# Patient Record
Sex: Female | Born: 1945
Health system: Southern US, Community
[De-identification: ages and names within clinical notes are randomized; demographics above are authoritative.]

## PROBLEM LIST (undated history)

## (undated) DIAGNOSIS — H269 Unspecified cataract: Secondary | ICD-10-CM

## (undated) DIAGNOSIS — G0490 Encephalitis and encephalomyelitis, unspecified: Secondary | ICD-10-CM

## (undated) DIAGNOSIS — M2540 Effusion, unspecified joint: Secondary | ICD-10-CM

## (undated) DIAGNOSIS — H9190 Unspecified hearing loss, unspecified ear: Secondary | ICD-10-CM

## (undated) DIAGNOSIS — M1990 Unspecified osteoarthritis, unspecified site: Secondary | ICD-10-CM

## (undated) DIAGNOSIS — M549 Dorsalgia, unspecified: Secondary | ICD-10-CM

## (undated) DIAGNOSIS — L719 Rosacea, unspecified: Secondary | ICD-10-CM

## (undated) DIAGNOSIS — R351 Nocturia: Secondary | ICD-10-CM

## (undated) DIAGNOSIS — R51 Headache: Secondary | ICD-10-CM

## (undated) DIAGNOSIS — I10 Essential (primary) hypertension: Secondary | ICD-10-CM

## (undated) DIAGNOSIS — F419 Anxiety disorder, unspecified: Secondary | ICD-10-CM

## (undated) DIAGNOSIS — Z9289 Personal history of other medical treatment: Secondary | ICD-10-CM

## (undated) DIAGNOSIS — F32A Depression, unspecified: Secondary | ICD-10-CM

## (undated) DIAGNOSIS — R413 Other amnesia: Secondary | ICD-10-CM

## (undated) DIAGNOSIS — M2550 Pain in unspecified joint: Secondary | ICD-10-CM

## (undated) DIAGNOSIS — G4700 Insomnia, unspecified: Secondary | ICD-10-CM

## (undated) DIAGNOSIS — F329 Major depressive disorder, single episode, unspecified: Secondary | ICD-10-CM

## (undated) HISTORY — DX: Unspecified hearing loss, unspecified ear: H91.90

## (undated) HISTORY — PX: MASS EXCISION: SHX2000

## (undated) HISTORY — DX: Encephalitis and encephalomyelitis, unspecified: G04.90

## (undated) HISTORY — DX: Anxiety disorder, unspecified: F41.9

## (undated) HISTORY — PX: RETINAL DETACHMENT SURGERY: SHX105

## (undated) HISTORY — PX: COLONOSCOPY: SHX174

## (undated) HISTORY — DX: Other amnesia: R41.3

## (undated) HISTORY — DX: Essential (primary) hypertension: I10

## (undated) HISTORY — DX: Rosacea, unspecified: L71.9

---

## 1971-07-16 DIAGNOSIS — G0490 Encephalitis and encephalomyelitis, unspecified: Secondary | ICD-10-CM

## 1971-07-16 HISTORY — DX: Encephalitis and encephalomyelitis, unspecified: G04.90

## 1999-08-16 ENCOUNTER — Encounter: Admission: RE | Admit: 1999-08-16 | Discharge: 1999-08-16 | Payer: Self-pay | Admitting: Family Medicine

## 1999-08-16 ENCOUNTER — Encounter: Payer: Self-pay | Admitting: Family Medicine

## 2000-08-18 ENCOUNTER — Encounter: Admission: RE | Admit: 2000-08-18 | Discharge: 2000-08-18 | Payer: Self-pay | Admitting: Family Medicine

## 2000-08-18 ENCOUNTER — Encounter: Payer: Self-pay | Admitting: Family Medicine

## 2001-09-08 ENCOUNTER — Encounter: Admission: RE | Admit: 2001-09-08 | Discharge: 2001-09-08 | Payer: Self-pay | Admitting: Family Medicine

## 2001-09-08 ENCOUNTER — Encounter: Payer: Self-pay | Admitting: Family Medicine

## 2002-04-02 ENCOUNTER — Ambulatory Visit (HOSPITAL_COMMUNITY): Admission: RE | Admit: 2002-04-02 | Discharge: 2002-04-02 | Payer: Self-pay | Admitting: Family Medicine

## 2002-04-02 ENCOUNTER — Encounter: Payer: Self-pay | Admitting: Family Medicine

## 2002-09-15 ENCOUNTER — Encounter: Payer: Self-pay | Admitting: Family Medicine

## 2002-09-15 ENCOUNTER — Encounter: Admission: RE | Admit: 2002-09-15 | Discharge: 2002-09-15 | Payer: Self-pay | Admitting: Family Medicine

## 2002-12-08 ENCOUNTER — Other Ambulatory Visit: Admission: RE | Admit: 2002-12-08 | Discharge: 2002-12-08 | Payer: Self-pay | Admitting: Family Medicine

## 2003-01-27 ENCOUNTER — Encounter: Payer: Self-pay | Admitting: Family Medicine

## 2003-01-27 ENCOUNTER — Ambulatory Visit (HOSPITAL_COMMUNITY): Admission: RE | Admit: 2003-01-27 | Discharge: 2003-01-27 | Payer: Self-pay | Admitting: Family Medicine

## 2003-02-16 ENCOUNTER — Encounter: Payer: Self-pay | Admitting: Family Medicine

## 2003-02-16 ENCOUNTER — Ambulatory Visit (HOSPITAL_COMMUNITY): Admission: RE | Admit: 2003-02-16 | Discharge: 2003-02-16 | Payer: Self-pay | Admitting: Family Medicine

## 2003-06-25 ENCOUNTER — Ambulatory Visit (HOSPITAL_COMMUNITY): Admission: RE | Admit: 2003-06-25 | Discharge: 2003-06-25 | Payer: Self-pay | Admitting: Family Medicine

## 2003-07-16 HISTORY — PX: CYST EXCISION: SHX5701

## 2003-08-17 ENCOUNTER — Ambulatory Visit (HOSPITAL_COMMUNITY): Admission: RE | Admit: 2003-08-17 | Discharge: 2003-08-17 | Payer: Self-pay | Admitting: Family Medicine

## 2003-09-22 ENCOUNTER — Encounter: Admission: RE | Admit: 2003-09-22 | Discharge: 2003-09-22 | Payer: Self-pay | Admitting: Family Medicine

## 2003-10-14 ENCOUNTER — Ambulatory Visit (HOSPITAL_COMMUNITY): Admission: RE | Admit: 2003-10-14 | Discharge: 2003-10-14 | Payer: Self-pay | Admitting: Family Medicine

## 2003-10-25 ENCOUNTER — Ambulatory Visit (HOSPITAL_COMMUNITY): Admission: RE | Admit: 2003-10-25 | Discharge: 2003-10-25 | Payer: Self-pay | Admitting: Family Medicine

## 2003-12-13 ENCOUNTER — Ambulatory Visit (HOSPITAL_COMMUNITY): Admission: RE | Admit: 2003-12-13 | Discharge: 2003-12-14 | Payer: Self-pay | Admitting: Neurosurgery

## 2003-12-13 ENCOUNTER — Encounter (INDEPENDENT_AMBULATORY_CARE_PROVIDER_SITE_OTHER): Payer: Self-pay | Admitting: Specialist

## 2004-06-27 ENCOUNTER — Other Ambulatory Visit: Admission: RE | Admit: 2004-06-27 | Discharge: 2004-06-27 | Payer: Self-pay | Admitting: Family Medicine

## 2005-07-15 HISTORY — PX: BACK SURGERY: SHX140

## 2005-12-24 ENCOUNTER — Encounter: Admission: RE | Admit: 2005-12-24 | Discharge: 2005-12-24 | Payer: Self-pay | Admitting: Neurosurgery

## 2006-03-07 ENCOUNTER — Ambulatory Visit: Admission: RE | Admit: 2006-03-07 | Discharge: 2006-03-07 | Payer: Self-pay | Admitting: Neurosurgery

## 2006-03-11 ENCOUNTER — Ambulatory Visit: Payer: Self-pay | Admitting: Cardiology

## 2006-03-11 ENCOUNTER — Ambulatory Visit: Payer: Self-pay

## 2006-03-11 ENCOUNTER — Encounter: Payer: Self-pay | Admitting: Cardiology

## 2006-03-20 ENCOUNTER — Inpatient Hospital Stay (HOSPITAL_COMMUNITY): Admission: RE | Admit: 2006-03-20 | Discharge: 2006-03-23 | Payer: Self-pay | Admitting: Neurosurgery

## 2006-04-22 ENCOUNTER — Encounter: Admission: RE | Admit: 2006-04-22 | Discharge: 2006-04-22 | Payer: Self-pay | Admitting: Neurosurgery

## 2006-08-05 ENCOUNTER — Other Ambulatory Visit: Admission: RE | Admit: 2006-08-05 | Discharge: 2006-08-05 | Payer: Self-pay | Admitting: Family Medicine

## 2006-08-12 ENCOUNTER — Encounter: Admission: RE | Admit: 2006-08-12 | Discharge: 2006-08-12 | Payer: Self-pay | Admitting: Neurosurgery

## 2007-03-26 ENCOUNTER — Encounter: Admission: RE | Admit: 2007-03-26 | Discharge: 2007-03-26 | Payer: Self-pay | Admitting: Neurosurgery

## 2007-07-16 HISTORY — PX: JOINT REPLACEMENT: SHX530

## 2007-08-26 ENCOUNTER — Other Ambulatory Visit: Admission: RE | Admit: 2007-08-26 | Discharge: 2007-08-26 | Payer: Self-pay | Admitting: Family Medicine

## 2008-02-18 ENCOUNTER — Encounter: Admission: RE | Admit: 2008-02-18 | Discharge: 2008-02-18 | Payer: Self-pay | Admitting: Orthopaedic Surgery

## 2008-03-01 ENCOUNTER — Encounter (INDEPENDENT_AMBULATORY_CARE_PROVIDER_SITE_OTHER): Payer: Self-pay | Admitting: Orthopedic Surgery

## 2008-03-01 ENCOUNTER — Ambulatory Visit: Payer: Self-pay | Admitting: Vascular Surgery

## 2008-03-01 ENCOUNTER — Ambulatory Visit (HOSPITAL_COMMUNITY): Admission: RE | Admit: 2008-03-01 | Discharge: 2008-03-01 | Payer: Self-pay | Admitting: Orthopedic Surgery

## 2008-05-10 ENCOUNTER — Inpatient Hospital Stay (HOSPITAL_COMMUNITY): Admission: RE | Admit: 2008-05-10 | Discharge: 2008-05-13 | Payer: Self-pay | Admitting: Orthopaedic Surgery

## 2008-08-17 ENCOUNTER — Encounter: Admission: RE | Admit: 2008-08-17 | Discharge: 2008-08-17 | Payer: Self-pay | Admitting: Family Medicine

## 2008-10-31 ENCOUNTER — Other Ambulatory Visit: Admission: RE | Admit: 2008-10-31 | Discharge: 2008-10-31 | Payer: Self-pay | Admitting: Family Medicine

## 2009-12-08 ENCOUNTER — Ambulatory Visit (HOSPITAL_BASED_OUTPATIENT_CLINIC_OR_DEPARTMENT_OTHER): Admission: RE | Admit: 2009-12-08 | Discharge: 2009-12-08 | Payer: Self-pay | Admitting: Orthopedic Surgery

## 2009-12-25 ENCOUNTER — Encounter: Admission: RE | Admit: 2009-12-25 | Discharge: 2009-12-25 | Payer: Self-pay | Admitting: Family Medicine

## 2009-12-27 ENCOUNTER — Encounter: Admission: RE | Admit: 2009-12-27 | Discharge: 2009-12-27 | Payer: Self-pay | Admitting: Family Medicine

## 2010-11-27 NOTE — Op Note (Signed)
[name], [name]               ACCOUNT NO.:  [id number]   MEDICAL RECORD NO.:  [id number]          PATIENT TYPE:  INP   LOCATION:  5040                         FACILITY:  [facility]   PHYSICIAN:  Lubertha Basque. Dalldorf, M.D.DATE OF BIRTH:  02-27-1946   DATE OF PROCEDURE:  05/10/2008  DATE OF DISCHARGE:                               OPERATIVE REPORT   PREOPERATIVE DIAGNOSIS:  Left knee degenerative arthritis.   POSTOPERATIVE DIAGNOSIS:  Left knee degenerative arthritis.   PROCEDURE:  Left total knee replacement.   ANESTHESIA:  General and block.   ATTENDING SURGEON:  Lubertha Basque. Jerl Santos, MD   ASSISTANT:  Lindwood Qua, PA   INDICATIONS FOR PROCEDURE:  The patient is a 65 year old woman with a  long history of bilateral arthritis of knees.  She has failed at least 3  different oral anti-inflammatories.  She has also had cortisone  injection and a course of viscosupplementation agents.  She persists  with pain at both knees, left side slightly worse.  This limits her  ability to walk and rest.  By x-ray, she has end-stage degeneration of  medial and patellofemoral compartments.  She is offered knee  replacement.  Informed operative consent was obtained after discussion  of possible complications of reaction to anesthesia, infection, DVT, PE,  and death.   SUMMARY, FINDINGS, AND PROCEDURE:  Under general and block, a left knee  replacement was performed.  She had advanced degenerative change medial  and patellofemoral and good bone quality.  We addressed her problem with  cemented DePuy LCS system using the medium femur, 2.5 tibia, 10-mm deep  dish spacer, 32-mm all polyethylene patella.  We did include antibiotic  in the cement.  Bryna Colander assisted throughout and was invaluable to  the completion of the case in that he helped position and retract while  I performed the procedure.  He also closed simultaneously to help  minimize the OR time.   DESCRIPTION OF PROCEDURE:  The  patient was brought to the operating  suite where general anesthetic was applied without difficulty.  She was  given a block in the preanesthesia area.  She was positioned supine and  prepped and draped in normal sterile fashion.  After administration of  IV Kefzol, the left leg was elevated, exsanguinated, tourniquet inflated  above thigh.  All appropriate antiinfective measures were used including  the preoperative IV antibiotic, Betadine impregnated drape, enclosed  hooded exhaust systems for each member of surgical team.  A longitudinal  incision was made with dissection down to the extensor mechanism.  A  medial parapatellar incision was made and then structured.  The kneecap  was flipped and the knee flexed.  Findings were as noted above.  Excess  meniscal tissues and the ACL and PCL were excised.  We placed an  extramedullary guide on the tibia to make a cut with slight posterior  tilt.  We then placed an intramedullary guide in the femur to make  anterior and posterior cuts, creating a flexion gap of 10 mm.  A second  intramedullary guide was placed in the femur to make a  distal cut,  creating 10-mm extension gap, thereby balancing the knee.  We did set  her up slightly loose in terms of extension as she had a preoperative  flexion contracture.  The femur sized to a medium and the tibia to 2.5  and appropriate guides was placed and utilized.  The patella was cut  down to thickness by 8 mm to 15 and sized to 32.  A trial reduction was  done with these components.  Knee easily came to hyperextension and  flexed well.  The patella tracked well.  Trial components were removed,  followed by pulsatile lavage irrigation of all 3 cut bony surfaces.  Cement was mixed including Zinacef and this was pressurized onto the  bones followed by placement of the aforementioned components.  Excess  cement was trimmed and pressure was held on the component until cement  had hardened.  The knee was  again irrigated followed by placement of  drain exiting superolaterally.  The extensor mechanism was  reapproximated with #1 Vicryl in interrupted fashion followed by  subcutaneous reapproximation with 0 and 2-0 undyed Vicryl.  Skin was  closed with staples.  Adaptic was applied followed by dry gauze and  loose Ace wrap.  Estimated blood loss and intraoperative fluids can be  obtained from anesthesia records as can accurate tourniquet time.   DISPOSITION:  The patient was extubated in the operating room and taken  to recovery room in stable addition.  She will be admitted to the  orthopedic surgery service for appropriate postop care to include  perioperative antibiotics, Coumadin plus Lovenox for DVT prophylaxis.      Lubertha Basque Jerl Santos, M.D.  Electronically Signed     PGD/MEDQ  D:  05/10/2008  T:  05/11/2008  Job:  [id number]

## 2010-11-30 NOTE — Discharge Summary (Signed)
[name], [name]               ACCOUNT NO.:  [id number]   MEDICAL RECORD NO.:  [id number]          PATIENT TYPE:  INP   LOCATION:  3014                         FACILITY:  [facility]   PHYSICIAN:  Donalee Citrin, M.D.        DATE OF BIRTH:  [birth year]   DATE OF ADMISSION:  03/20/2006  DATE OF DISCHARGE:  03/23/2006                                 DISCHARGE SUMMARY   ADMITTING DIAGNOSIS:  Grade 1 spondylolisthesis with degenerative joint  disease, L4-L5.   PROCEDURE:  During this hospitalization was a posterior lumbar interbody  fusion L4-L5.   DISCHARGE DIAGNOSIS:  Degenerative disk disease spondylolisthesis L4-L5 with  bilateral L5 radiculopathy.   HOSPITAL COURSE:  The patient is a 65 year old female who was admitted to  the hospital and taken to the operating room and underwent the  aforementioned procedure.  Post-op the patient did very well, recovered on  the floor.  By day one, the patient was mobilized with physical therapy.  Leg pain had been completely resolved.  Foley was able to be taken out.  Hemovac drain was able to be taken out on day two.  The patient continued to  do well with therapy at the time of discharge and was felt to be stable for  discharge home.  She was ambulating, and voiding spontaneously, pain well  controlled on oral pain medicine.  She was afebrile and set up with followup  in 2 weeks.           ______________________________  Donalee Citrin, M.D.     GC/MEDQ  D:  05/09/2006  T:  05/09/2006  Job:  [id number]

## 2010-11-30 NOTE — Discharge Summary (Signed)
[name], [name]               ACCOUNT NO.:  [id number]   MEDICAL RECORD NO.:  [id number]          PATIENT TYPE:  INP   LOCATION:  3014                         FACILITY:  [facility]   PHYSICIAN:  Donalee Citrin, M.D.        DATE OF BIRTH:  15-Jun-1946   DATE OF ADMISSION:  03/20/2006  DATE OF DISCHARGE:  03/23/2006                                 DISCHARGE SUMMARY   ADMITTING DIAGNOSIS:  1. Degenerative disc disease.  2. Degenerative spondylolisthesis L4-5.   PROCEDURE:  Posterior lumbar fusion L4-5.   HOSPITAL COURSE:  The patient was admitted ___________. Went to the  Operating Room and underwent the aforementioned procedure. Postoperatively,  the patient did very well in the Recovery Room and the floor. On the floor,  the patient had complete resolution of her preoperative leg pain and very  minimal back stiffness. The patient was progressed and mobilized with  physical therapy. Did very well over the next day or so. Her drain came out.  Her Foley came out. She was ambulating and voiding spontaneously. She will  be able to be discharged on hospital day 3. At the time of discharge the  patient will be sent home on p.o. pain and muscle relaxant medication and  set up for follow up in two weeks.           ______________________________  Donalee Citrin, M.D.     GC/MEDQ  D:  04/18/2006  T:  04/18/2006  Job:  [id number]

## 2010-11-30 NOTE — Assessment & Plan Note (Signed)
[facility]                              CARDIOLOGY OFFICE NOTE   NAME:[name], [name]                      MRN:          [id number]  DATE:03/11/2006                            DOB:          [birth year]    REFERRING PHYSICIAN:  Donalee Citrin, M.D.   PRIMARY CARE PHYSICIAN:  Holley Bouche, M.D.   REASON FOR EVALUATION:  Preoperative assessment with abnormal  electrocardiogram.   HISTORY OF PRESENT ILLNESS:  [name] is a pleasant 65 year old woman  with no clearly documented history of hypertension, type 2 diabetes  mellitus, coronary artery disease, or cardiac dysrhythmia.  She has a  history of back and right hip/leg pain that has been a problem since  December of 2006.  She is being considered for a lumbar fusion that has been  scheduled for tomorrow and we have been asked to evaluate her  preoperatively.  This surgery will be under general anesthesia and she did  have basic preoperative assessment including electrocardiogram done on  August 24. This study demonstrated sinus rhythm at 67 beats per minute with  an incomplete right bundle branch block pattern, possible left atrial  enlargement, and question of a septal infarct that was described as being  new compared to the prior tracing.  I do not have the old tracing for  comparison but today's study shows a probable ectopic atrial rhythm with  less evidence of incomplete right bundle branch block pattern and no clearly  documented septal infarct.  There are otherwise nonspecific ST-T wave  changes noted.  Symptomatically [name] denies having any limitations  from the perspective of chest pain with exertion or dyspnea on exertion.  Her main limitation at this point is due to right leg pain although despite  this, she states that she ambulates up and down stairs several times a day  at her place of business.  Prior to December, she was also walking regularly  up to 45 minutes at a time  before work and tolerated this quite well.  She  has no history of palpitations or syncope, and otherwise states that she  feels well.   ALLERGIES:  SULFA DRUGS.   PRESENT MEDICATIONS:  1. Fosamax 70 mg weekly.  2. Mobic.  3. Tramadol.  4. Multivitamins.  5. Fish oil supplements.  6. Calcium supplements.   PAST MEDICAL HISTORY:  1. As outlined above.  2. She does have a prior history of disk surgery back in 2005.   SOCIAL HISTORY:  The patient is married and has two children.  She is an  Environmental health practitioner.  She has no tobacco use history or alcohol use  history.   FAMILY HISTORY:  Noncontributory for premature cardiovascular disease or  sudden cardiac death.   REVIEW OF SYSTEMS:  As described in history of present illness. She has had  problems with depression in the past.  Otherwise systems are negative.   PHYSICAL EXAMINATION:  VITAL SIGNS:  Blood pressure 130/72, heart rate 77,  weight 101 pounds.  GENERAL:  The patient is comfortable and in no acute distress.  HEENT:  Conjunctivae and lids are grossly normal.  Oropharynx is clear.  NECK:  Supple without elevated jugular venous pressure without bruits.  No  thyromegaly is noted.  LUNGS:  Clear without labored breathing.  CARDIAC:  Regular rate and rhythm with a soft S4.  No S3, gallop or  pericardial rub. Second heart sound is normal.  No loud murmurs appreciated.  ABDOMEN:  Soft with no obvious bruits.  No hepatomegaly.  EXTREMITIES:  No significant pitting edema with 2+ pulses.  SKIN:  Warm and dry without ulcerative lesions.  NEUROPSYCHIATRIC:  The patient is alert and oriented x3.   LABORATORY DATA:  An echocardiogram was performed in the office today  demonstrating a left ventricular ejection fraction of 60-65% with normal  chamber size and no significant left ventricular hypertrophy.  No focal wall  motion abnormalities were noted and right ventricular function was normal as  well.  There was mild mitral  and tricuspid regurgitation noted.  Left atrial  size was within normal limits.  Pulmonary artery systolic pressures were  estimated in the normal range and there was no significant pericardial  effusion.  A full report was transcribed elsewhere.   IMPRESSION AND RECOMMENDATIONS:  1. Perioperative evaluation in a 65 year old woman with abnormal resting      electrocardiogram as identified above. She does have evidence of an      intermittent ectopic atrial rhythm although seems to be asymptomatic in      this regard.  There is no major structural cardiac disease identified      by echocardiography today.  She is not reporting any significant      exertional angina or shortness of breath.  At this point I would think      proceeding with planned surgery would be reasonable at an acceptable      risk and for the time being I have initiated low dose beta blocker      therapy to be continued in the perioperative period.  She will be      started on Toprol-XL 25 mg daily and will otherwise plan to see her      back postoperatively.  I do not anticipate any ischemic testing at this      point.  She is not      manifesting any sense of palpitations and at this point will simply      observe.  2. Lumbar disk disease, scheduled for lumbar fusion by Dr. Wynetta Emery.                                Jonelle Sidle, MD    SGM/MedQ  DD:  03/11/2006  DT:  03/11/2006  Job #:  [id number]   cc:   Donalee Citrin, MD  Holley Bouche, MD

## 2010-11-30 NOTE — Discharge Summary (Signed)
[name], [name]               ACCOUNT NO.:  [id number]   MEDICAL RECORD NO.:  [id number]          PATIENT TYPE:  INP   LOCATION:  5040                         FACILITY:  [facility]   PHYSICIAN:  Lubertha Basque. Dalldorf, M.D.DATE OF BIRTH:  03-17-1946   DATE OF ADMISSION:  05/10/2008  DATE OF DISCHARGE:  05/13/2008                               DISCHARGE SUMMARY   ADMITTING DIAGNOSES:  1. End-stage degenerative joint disease, left knee  2. History of lumbar laminectomy.   DISCHARGE DIAGNOSES:  1. End-stage degenerative joint disease, left knee  2. History of lumbar laminectomy.  3. Blood loss anemia.   BRIEF HISTORY:  [name] is a 65 year old white female patient well  known to our practice.  She has had increasing left knee pain.  Now, she  is having pain with every step, night pain as well, unrelieved by  medications.  She has failed corticosteroid injections.  These goes  supplemented injections and oral anti-inflammatory pills.  I have  discussed treatment options with her that being total knee replacement.   PERTINENT LABORATORY AND X-RAY FINDINGS:  WBC 6.9, hemoglobin with  dropped is 7.9 and then after transfusion 10.8, platelets of 208.  Regular serial INRs were done as she was on low-dose Coumadin protocol  for DVT prophylaxis.  Sodium 136, potassium 3.7, glucose of 100, BUN of  8, and creatinine at 0.54.  She did have 2 units of packed RBCs  transfused due to blood loss anemia.   OPERATION:  Total knee replacement, left side.   COURSE IN HOSPITAL:  She was admitted postoperatively, placed on a  variety of medications including the PCA pump.  Her medications which  will be outlined at the end of this dictation, IV Ancef 1 g q.8 h. x3  doses, Coumadin and Lovenox per pharmacy protocol DVT prophylaxis,  laxatives and stool softeners p.r.n., incentive spirometry, knee-high  TEDs, also used antiemetics, iron sulfate, and muscle relaxers.  A CPM  machine was utilized as well  as 0-50 degrees 8 hours a day and then  advanced as tolerated.  She had followup lab studies as mentioned above  which did show blood loss anemia and her RBCs were replaced as necessary  with 2 units of packed RBCs.  Physical therapy was also ordered, to be  out of bed, and weightbearing as tolerated.  The first day postop, her  vital signs were blood pressure 102/60, temperature was 100, hemoglobin  9.1, INR 1.0.  She had breath sounds and bowel sounds.  Foley catheter  was in.  Left knee on a CPM machine 0-50 degrees and doing well.  [facility] was the home agency chosen for home therapy and INRs  and they had come up and spokes with the patient in hospital.  Her blood  count dropped on the second day postop, hemoglobin 7.9 and 2 units of  packed cells were transfused as necessary.  She had no sign of  infection.  Good breath sounds.  Abdomen was soft.  Her knee dressing  was changed.  The wound was noted to be benign.  Foley  catheter was  discontinued.  On day 3, she was discharged home.   CONDITION ON DISCHARGE:  Improved.   FOLLOWUP:  She will remain on a low-sodium heart-healthy diet, change  her dressing on her knee daily.  Weightbearing as tolerated.  [facility] for home CPM 6-8 hours a day and then home therapy and home  INRs.  She will be on Coumadin for 2 weeks.  Any sign of infection to  call our office [phone], and then also for an appointment in 10 days.  She was given additional prescriptions for Percocet 1 or 2 q.4-6 h.  p.r.n. pain and is kept on her home medications, which are Fosamax, fish  oil, Tylenol, multivitamin, glucosamine, and calcium.      Lindwood Qua, P.A.      Lubertha Basque Jerl Santos, M.D.  Electronically Signed    MC/MEDQ  D:  06/14/2008  T:  06/15/2008  Job:  [id number]

## 2010-11-30 NOTE — Op Note (Signed)
[name], [name] NO.:  [id number]   MEDICAL RECORD NO.:  [id number]          PATIENT TYPE:  INP   LOCATION:  3014                         FACILITY:  [facility]   PHYSICIAN:  Donalee Citrin, M.D.        DATE OF BIRTH:  [birth year]   DATE OF PROCEDURE:  03/20/2006  DATE OF DISCHARGE:                                 OPERATIVE REPORT   PREOPERATIVE DIAGNOSES:  Lumbar spinal stenosis, L4-5, with synovial cyst on  the right at L4-5.  Discogenic mechanical back pain, and bilateral right  greater than left L5 radiculopathy.   PROCEDURE:  Re-do decompressive lumbar laminectomy, L4-5.  Posterior lumbar  interbody fusion, L4-5 using 10 x 20 mm Telamon PEEK cage packed with  locally-harvested allograft mixed with DBX bone substitute, and a 10 x 24 mm  Tangent allograft wedge.  Pedicle screw fixation of L4-5 using the Legacy  5.5 pedicle screw system.  Posterior, medial, anterior and posterolateral  arthrodesis, L4-5, using locally-harvested allograft mixed with DBX bone  substitute.   SURGEON:  Donalee Citrin, M.D.   Threasa HeadsYetta Barre.   ANESTHESIA:  General endotracheal.   HISTORY OF PRESENT ILLNESS:  Patient is a very pleasant 65 year old female  who has a longstanding back and bilateral leg pain worse in the right,  radiating down the top to the foot, consistent with L5 radiculopathy.  As  well, patient has had intractable mechanical back pain, worse when going  from lying to sitting and standing position.  The patient failed all forms  of conservative treatment over the last several years, with physical  therapy, epidural steroid injections and time.  Patient had undergone  previous laminectomy on the left at L4-5 and preoperative imaging showed a  laminotomy defect, a large degeneration of disk space, degenerative  spondylolisthesis at this level as well as what appeared to be degenerative  facet arthropathy with a synovial cyst on the right at L4-5.  Patient was  recommended re-do decompressive laminotomy and fusion.  Risks and benefits  were  explained to the patient and she agreed to proceed forward.   The patient was brought to the OR and received general anesthesia,  positioned prone on the Wilson frame, prepped and draped in the usual  sterile fashion.  Her old incision was opened up and extended  cephalocaudally.  Subperiosteal dissection was carried out after Bovie  electrocautery was used to dissect through the scar tissue at the lamina of  L3, 4, and 5 bilaterally, exposing the TPs at L4 and L5.  The scar tissue  overlying the laminotomy defect on the left L4-5 was immediately  appreciated. This was all dissected free to obtain exposure to the lateral  facet complex and the L5 at TP.  On the right side, all the subperiosteal  and lamina of facet complex, exposing the TPs.  Then the spinous process was  then removed with Leksell rongeur, first on the right side.  Laminotomy was  begun with complete central decompression.  Immediately visualized was the  large degenerative synovial cyst that had turned gray and necrotic, so  the  facet was unroofed over the top of this, exposing the undersurface of the 4  and the 5 roots.  Then the degenerative synovial cyst was teased away with a  4 Penfield, removed in piecemeal fashion, and both the 4 and the 5 nerve  roots were completely unroofed out of their foramen.  The lateral aspect of  the interspace was dissected free by Kerrison rongeur and the end surface of  the facet complex.   Then attention taken to the other side. Using a 4 Penfield and dental  dissectors, scar tissue was teased away from the undersurface of the facet  complex on the left at L4-5, and this was all teased away and removed in  piecemeal fashion.  There was a marked scar and degeneration of the facet  and the 4 and the 5 roots were identified and unroofed from their foramen.  The lateral disk space was dissected free and the  undersurface was under-  bitten to further gain lateral exposure to disk space.  At the end of this,  all four neuroforamina were unroofed.  The interspaces were incised.  First,  on the right side, the interspace was entered with 11 scalpel.  Pituitary  rongeurs were used to clean up the disk space and a size 9 distractor was  inserted.  This was felt to be almost big enough and a 10 was felt to be  appropriate sizing; so the left-sided interspace was coagulated, incised,  cleaned out and a 10 distractor inserted.  Fluoroscopy confirmed this to be  the appropriate size for the grafts.  Then the right side distractor was  removed, a size 10 cutter and chisel used.  The endplates, a 10 x 24 mm  Tangent allograft wedge was inserted on the right side, 1-2 mm deep to the  posterior tibial, aligned, and confirmed each step along the way with  fluoroscopy.  The neuroforamina were protected and the nerve roots were  retracted to allow safe placement of the graft.  Then the distractor was  removed from the left side.  The interspace was cleaned out in a similar  fashion.  Locally-harvested allograft was packed against the right side  allograft and a 10 x 20 Telamon PEEK cage packed with DBX bone substitute  and locally-harvested allograft was packed in the left side.   After all interbody grafts were in place, attention taken to pedicle screw  placement.  Pilot holes were drilled on the left side at L4, cannulated with  the awl, probed, tapped with the 5.5 tap, probed again, and a 6 x 40 screw  inserted on the left at L4, using fluoroscopy and palpation from within the  pedicle as well as within the canal to confirm the mediolateral breach and  the pedicle to be competent with good orientation.  A 6 x 35 screw was  inserted in similar fashion at L5 and again a 6 x 40 screw inserted at L4 on the right, and a 6 x 35 at L5 on the right.  After all four screws were  inserted, the wound was copiously  irrigated and meticulous hemostasis was  obtained.  Aggressive decortication was carried of the TPs and lateral  gutters.  The remainder of the locally-harvested allograft was mixed with  DBX bone substitute and packed in the lateral gutters.  Then a 30-mm rod was  sized, selected, and inserted.  Top-tightening nut was tightened at L5, the  L4 pedicle screws compressed against L5  and torqued down.  Then a crosslink  was inserted.  Then the neuroforamina re-explored, noted to be widely  patent.  Confirmed no migration of graft material within the canal.  All  locally-harvested allograft had been packed against the lateral gutters  already, and Gelfoam was overlaid on top of the dura and nerve roots.  The  medium Hemovac drain was placed, and the wound was closed in layers of  interrupted Vicryl.  The skin was closed with running 4-0 subcuticular.  The  patient went to the recovery room in stable condition.  At the end of the  case, needle counts and sponge counts were correct.           ______________________________  Donalee Citrin, M.D.     GC/MEDQ  D:  03/20/2006  T:  03/20/2006  Job:  [id number]

## 2010-11-30 NOTE — Op Note (Signed)
NAME:  [name], [name]                         ACCOUNT NO.:  [id number]   MEDICAL RECORD NO.:  [id number]                   PATIENT TYPE:  OIB   LOCATION:                                       FACILITY:  [facility]   PHYSICIAN:  Clydene Fake, M.D.               DATE OF BIRTH:  Jun 28, 1946   DATE OF PROCEDURE:  12/13/2003  DATE OF DISCHARGE:                                 OPERATIVE REPORT   PREOPERATIVE DIAGNOSIS:  Spondylosis, left epidural mass (synovial cyst) at  L4-L5 with left-sided radiculopathy.   POSTOPERATIVE DIAGNOSIS:  Spondylosis, left epidural mass (synovial cyst) at  L4-L5 with left-sided radiculopathy.   OPERATION:  Left L4-L5 laminectomy for epidural mass (synovial cyst) with  excision of the mass, microdissection with the microscope.   SURGEON:  Clydene Fake, M.D.   ASSISTANT:  Hewitt Shorts, M.D.   ANESTHESIA:  General endotracheal tube anesthesia.   ESTIMATED BLOOD LOSS:  Minimal.   BLOOD GIVEN:  None.   DRAINS:  None.   COMPLICATIONS:  None.   REASON FOR PROCEDURE:  The patient is a 65 year old woman who has been  having left back and left leg pain, and anti-inflammatory medications.  Pain  was worsening.  MRI is demonstrating multilevel changes, disk bulge, broad-  based 3-4 causing some spinal stenosis and degenerative disk disease at L5-  S1, but at L4-L5 there was a large epidural mass consistent with synovial  cyst, causing a left sided stenosis and root compression.  The patient  brought for decompression.   DESCRIPTION OF PROCEDURE:  The patient was brought to the operating room and  general anesthesia induced, and the patient was placed in a prone position  on a Wilson frame with all pressure points padded.  The patient was prepped  and draped in sterile fashion.  A stab incision was injected with 10 mL of  1% lidocaine with epinephrine.  A needle was then placed in the nerve space,  x-ray was obtained, ensuring this was point up at the  4-5 interspace.  The  incision was then made centered where the needle was.  The incision was  taken down through the fascia and hemostasis was obtained with Bovie  cauterization.  The fascia was incised with the Bovie and subperiosteal  dissection was done of the L4 and L5 spinous processes out to the lamina and  facets.  A retractor was placed and another x-ray was obtained with marker  at the L4-L5 interspace, confirming our positioning in this space.  At this  time, a semi-hemilaminectomy was performed with high-speed drill and then  Kerrison punches, and medial facetectomy was performed.  We removed the  ligamentum flavum.  A very thickened mass within the ligament and connected  to the facet joint.  It was seen and with yellowish tissue within, maybe  some old hemorrhage within this cavity, but no real cyst with  fluid, which  more harder substance.  We carefully removed this in piecemeal fashion.  There was significant compression to the thecal sac and into the L5 foot.  A  couple of pieces were sent for permanent pathology.  When we were finished,  a good foraminotomy was made of the 5 root and we had good decompression of  the thecal sac in the 5 foot and checked the 4 root, and it was decompressed  also.  We explored the disk space, and slight disk bulge, but no disk  herniation.  Good decompression of the nerves.  We did not enter the disk  space.  Hemostasis was obtained with Gelfoam, and was then irrigated out.  The wound was irrigated with antibiotic solution.  Retractors removed.  Fascia closed with #0 Vicryl interrupted sutures, the subcutaneous tissue  closed with #0, 2-0 and 3-0 Vicryl interrupted sutures and the skin closed  with benzoin and Steri-Strips.  Dressing was placed.  The patient was placed  back in the supine position awakened from anesthesia and was transferred to  the recovery room in stable condition.                                               Clydene Fake, M.D.    JRH/MEDQ  D:  12/13/2003  T:  12/13/2003  Job:  [id number]

## 2011-04-15 LAB — CBC
HCT: 22.3 — ABNORMAL LOW
HCT: 25.4 — ABNORMAL LOW
HCT: 31.1 — ABNORMAL LOW
Hemoglobin: 10.8 — ABNORMAL LOW
Hemoglobin: 12.8
Hemoglobin: 7.9 — CL
Hemoglobin: 9.1 — ABNORMAL LOW
MCHC: 33.8
MCHC: 35.1
MCV: 91.1
MCV: 92.5
RBC: 2.43 — ABNORMAL LOW
RBC: 2.79 — ABNORMAL LOW
RBC: 3.4 — ABNORMAL LOW
RBC: 4.09
RDW: 12.7
RDW: 13.3
WBC: 9.6

## 2011-04-15 LAB — BASIC METABOLIC PANEL
CO2: 26
CO2: 28
Calcium: 8.4
Chloride: 101
Chloride: 102
GFR calc Af Amer: >60
GFR calc Af Amer: >60
GFR calc Af Amer: >60
GFR calc non Af Amer: >60
Glucose, Bld: 100 — ABNORMAL HIGH
Glucose, Bld: 130 — ABNORMAL HIGH
Potassium: 3.7
Potassium: 3.7
Potassium: 4.2
Sodium: 134 — ABNORMAL LOW
Sodium: 136

## 2011-04-15 LAB — CROSSMATCH

## 2011-04-15 LAB — PROTIME-INR
INR: 1.1
INR: 2.4 — ABNORMAL HIGH

## 2011-07-16 HISTORY — PX: OTHER SURGICAL HISTORY: SHX169

## 2011-08-23 ENCOUNTER — Encounter (HOSPITAL_BASED_OUTPATIENT_CLINIC_OR_DEPARTMENT_OTHER): Payer: Self-pay | Admitting: *Deleted

## 2011-08-23 NOTE — Progress Notes (Signed)
Bring in prescriptions medications.

## 2011-08-26 ENCOUNTER — Other Ambulatory Visit: Payer: Self-pay | Admitting: Orthopedic Surgery

## 2011-08-28 ENCOUNTER — Encounter (HOSPITAL_BASED_OUTPATIENT_CLINIC_OR_DEPARTMENT_OTHER): Payer: Self-pay | Admitting: Anesthesiology

## 2011-08-28 ENCOUNTER — Ambulatory Visit (HOSPITAL_BASED_OUTPATIENT_CLINIC_OR_DEPARTMENT_OTHER)
Admission: RE | Admit: 2011-08-28 | Discharge: 2011-08-28 | Disposition: A | Discharge status: Home / Self Care | Payer: Medicare Other | Source: Ambulatory Visit | Attending: Orthopedic Surgery | Admitting: Orthopedic Surgery

## 2011-08-28 ENCOUNTER — Encounter (HOSPITAL_BASED_OUTPATIENT_CLINIC_OR_DEPARTMENT_OTHER): Payer: Self-pay

## 2011-08-28 ENCOUNTER — Encounter (HOSPITAL_BASED_OUTPATIENT_CLINIC_OR_DEPARTMENT_OTHER): Payer: Self-pay | Admitting: Orthopedic Surgery

## 2011-08-28 ENCOUNTER — Ambulatory Visit (HOSPITAL_BASED_OUTPATIENT_CLINIC_OR_DEPARTMENT_OTHER): Payer: Medicare Other | Admitting: Anesthesiology

## 2011-08-28 ENCOUNTER — Encounter (HOSPITAL_BASED_OUTPATIENT_CLINIC_OR_DEPARTMENT_OTHER)
Admission: RE | Disposition: A | Discharge status: Home / Self Care | Payer: Self-pay | Source: Ambulatory Visit | Attending: Orthopedic Surgery

## 2011-08-28 ENCOUNTER — Other Ambulatory Visit: Payer: Self-pay | Admitting: Orthopedic Surgery

## 2011-08-28 DIAGNOSIS — D4819 Other specified neoplasm of uncertain behavior of connective and other soft tissue: Secondary | ICD-10-CM | POA: Insufficient documentation

## 2011-08-28 DIAGNOSIS — Z96659 Presence of unspecified artificial knee joint: Secondary | ICD-10-CM | POA: Insufficient documentation

## 2011-08-28 DIAGNOSIS — D481 Neoplasm of uncertain behavior of connective and other soft tissue: Secondary | ICD-10-CM | POA: Insufficient documentation

## 2011-08-28 HISTORY — DX: Unspecified osteoarthritis, unspecified site: M19.90

## 2011-08-28 SURGERY — EXCISION MASS
Anesthesia: General | Site: Finger | Laterality: Left | Wound class: Clean

## 2011-08-28 MED ORDER — EPHEDRINE SULFATE 50 MG/ML IJ SOLN
INTRAMUSCULAR | Status: DC | PRN
  Administered 2011-08-28: 5 mg via INTRAVENOUS
  Administered 2011-08-28: 10 mg via INTRAVENOUS

## 2011-08-28 MED ORDER — CHLORHEXIDINE GLUCONATE 4 % EX LIQD: 60.0000 mL | Freq: Once | CUTANEOUS | Status: DC

## 2011-08-28 MED ORDER — MIDAZOLAM HCL 2 MG/2ML IJ SOLN: 1.0000 mg | INTRAMUSCULAR | Status: DC | PRN

## 2011-08-28 MED ORDER — CEFAZOLIN SODIUM 1-5 GM-% IV SOLN
1.0000 g | INTRAVENOUS | Status: DC
  Administered 2011-08-28: 1 g via INTRAVENOUS

## 2011-08-28 MED ORDER — LORAZEPAM 2 MG/ML IJ SOLN: 1.0000 mg | Freq: Once | INTRAMUSCULAR | Status: DC | PRN

## 2011-08-28 MED ORDER — HYDROMORPHONE HCL PF 1 MG/ML IJ SOLN: 0.2500 mg | INTRAMUSCULAR | Status: DC | PRN

## 2011-08-28 MED ORDER — CEFAZOLIN SODIUM 1-5 GM-% IV SOLN: 1.0000 g | INTRAVENOUS | Status: DC

## 2011-08-28 MED ORDER — HYDROCODONE-ACETAMINOPHEN 5-500 MG PO TABS: 1.0000 | ORAL_TABLET | ORAL | Status: AC | PRN

## 2011-08-28 MED ORDER — DEXAMETHASONE SODIUM PHOSPHATE 10 MG/ML IJ SOLN
INTRAMUSCULAR | Status: DC | PRN
  Administered 2011-08-28: 10 mg via INTRAVENOUS

## 2011-08-28 MED ORDER — PROMETHAZINE HCL 25 MG/ML IJ SOLN: 6.2500 mg | INTRAMUSCULAR | Status: DC | PRN

## 2011-08-28 MED ORDER — PROPOFOL 10 MG/ML IV EMUL
INTRAVENOUS | Status: DC | PRN
  Administered 2011-08-28: 150 mg via INTRAVENOUS

## 2011-08-28 MED ORDER — LACTATED RINGERS IV SOLN
INTRAVENOUS | Status: DC | PRN
  Administered 2011-08-28 (×2): via INTRAVENOUS

## 2011-08-28 MED ORDER — BUPIVACAINE HCL (PF) 0.25 % IJ SOLN
INTRAMUSCULAR | Status: DC | PRN
  Administered 2011-08-28: 6 mL

## 2011-08-28 MED ORDER — LACTATED RINGERS IV SOLN
INTRAVENOUS | Status: DC
  Administered 2011-08-28: 08:00:00 via INTRAVENOUS

## 2011-08-28 MED ORDER — FENTANYL CITRATE 0.05 MG/ML IJ SOLN
INTRAMUSCULAR | Status: DC | PRN
  Administered 2011-08-28: 50 ug via INTRAVENOUS
  Administered 2011-08-28 (×3): 25 ug via INTRAVENOUS

## 2011-08-28 MED ORDER — LIDOCAINE HCL (CARDIAC) 20 MG/ML IV SOLN
INTRAVENOUS | Status: DC | PRN
  Administered 2011-08-28: 60 mg via INTRAVENOUS

## 2011-08-28 MED ORDER — ONDANSETRON HCL 4 MG/2ML IJ SOLN
INTRAMUSCULAR | Status: DC | PRN
  Administered 2011-08-28: 4 mg via INTRAVENOUS

## 2011-08-28 MED ORDER — FENTANYL CITRATE 0.05 MG/ML IJ SOLN: 50.0000 ug | INTRAMUSCULAR | Status: DC | PRN

## 2011-08-28 SURGICAL SUPPLY — 57 items
BANDAGE COBAN STERILE 2 (GAUZE/BANDAGES/DRESSINGS) IMPLANT
BANDAGE GAUZE ELAST BULKY 4 IN (GAUZE/BANDAGES/DRESSINGS) IMPLANT
BLADE MINI RND TIP GREEN BEAV (BLADE) ×2 IMPLANT
BLADE SURG 15 STRL LF DISP TIS (BLADE) ×1 IMPLANT
BLADE SURG 15 STRL SS (BLADE) ×2
BNDG CMPR 9X4 STRL LF SNTH (GAUZE/BANDAGES/DRESSINGS) ×1
BNDG COHESIVE 1X5 TAN STRL LF (GAUZE/BANDAGES/DRESSINGS) ×1 IMPLANT
BNDG COHESIVE 3X5 TAN STRL LF (GAUZE/BANDAGES/DRESSINGS) IMPLANT
BNDG ESMARK 4X9 LF (GAUZE/BANDAGES/DRESSINGS) ×1 IMPLANT
CHLORAPREP W/TINT 26ML (MISCELLANEOUS) ×2 IMPLANT
CLOTH BEACON ORANGE TIMEOUT ST (SAFETY) ×2 IMPLANT
CORDS BIPOLAR (ELECTRODE) ×2 IMPLANT
COVER MAYO STAND STRL (DRAPES) ×2 IMPLANT
COVER TABLE BACK 60X90 (DRAPES) ×2 IMPLANT
CUFF TOURNIQUET SINGLE 18IN (TOURNIQUET CUFF) ×1 IMPLANT
DECANTER SPIKE VIAL GLASS SM (MISCELLANEOUS) IMPLANT
DRAIN PENROSE 1/2X12 LTX STRL (WOUND CARE) IMPLANT
DRAPE EXTREMITY T 121X128X90 (DRAPE) ×2 IMPLANT
DRAPE OEC MINIVIEW 54X84 (DRAPES) ×1 IMPLANT
DRAPE SURG 17X23 STRL (DRAPES) ×2 IMPLANT
GAUZE XEROFORM 1X8 LF (GAUZE/BANDAGES/DRESSINGS) ×2 IMPLANT
GLOVE BIO SURGEON STRL SZ 6.5 (GLOVE) ×2 IMPLANT
GLOVE BIOGEL PI IND STRL 8.5 (GLOVE) IMPLANT
GLOVE BIOGEL PI INDICATOR 8.5 (GLOVE) ×2
GLOVE SURG ORTHO 8.0 STRL STRW (GLOVE) ×3 IMPLANT
GOWN BRE IMP PREV XXLGXLNG (GOWN DISPOSABLE) ×2 IMPLANT
GOWN PREVENTION PLUS XLARGE (GOWN DISPOSABLE) ×2 IMPLANT
KWIRE 4.0 X .045IN (WIRE) ×1 IMPLANT
LOOP VESSEL MINI RED (MISCELLANEOUS) ×1 IMPLANT
NDL SAFETY ECLIPSE 18X1.5 (NEEDLE) ×1 IMPLANT
NEEDLE 27GAX1X1/2 (NEEDLE) ×1 IMPLANT
NEEDLE HYPO 18GX1.5 SHARP (NEEDLE)
NS IRRIG 1000ML POUR BTL (IV SOLUTION) ×2 IMPLANT
PACK BASIN DAY SURGERY FS (CUSTOM PROCEDURE TRAY) ×2 IMPLANT
PAD CAST 3X4 CTTN HI CHSV (CAST SUPPLIES) IMPLANT
PADDING CAST ABS 3INX4YD NS (CAST SUPPLIES)
PADDING CAST ABS 4INX4YD NS (CAST SUPPLIES) ×1
PADDING CAST ABS COTTON 3X4 (CAST SUPPLIES) IMPLANT
PADDING CAST ABS COTTON 4X4 ST (CAST SUPPLIES) ×1 IMPLANT
PADDING CAST COTTON 3X4 STRL (CAST SUPPLIES)
SLEEVE SCD COMPRESS KNEE MED (MISCELLANEOUS) ×1 IMPLANT
SPEAR EYE SURG WECK-CEL (MISCELLANEOUS) ×1 IMPLANT
SPLINT FNGR PLAIN END 5/8X3.25 (CAST SUPPLIES) IMPLANT
SPLINT PLASTALUME 3 1/4 (CAST SUPPLIES) ×2
SPLINT PLASTER CAST XFAST 3X15 (CAST SUPPLIES) IMPLANT
SPLINT PLASTER XTRA FASTSET 3X (CAST SUPPLIES)
SPONGE GAUZE 4X4 12PLY (GAUZE/BANDAGES/DRESSINGS) ×2 IMPLANT
STOCKINETTE 4X48 STRL (DRAPES) ×2 IMPLANT
SUT SILK 3 0 PS 1 (SUTURE) ×1 IMPLANT
SUT VIC AB 4-0 P2 18 (SUTURE) IMPLANT
SUT VICRYL RAPID 5 0 P 3 (SUTURE) IMPLANT
SUT VICRYL RAPIDE 4/0 PS 2 (SUTURE) ×2 IMPLANT
SYR BULB 3OZ (MISCELLANEOUS) ×2 IMPLANT
SYR CONTROL 10ML LL (SYRINGE) ×1 IMPLANT
TOWEL OR 17X24 6PK STRL BLUE (TOWEL DISPOSABLE) ×3 IMPLANT
UNDERPAD 30X30 INCONTINENT (UNDERPADS AND DIAPERS) ×2 IMPLANT
WATER STERILE IRR 1000ML POUR (IV SOLUTION) ×1 IMPLANT

## 2011-08-28 NOTE — Anesthesia Preprocedure Evaluation (Signed)
Anesthesia Evaluation  Patient identified by MRN, date of birth, ID band Patient awake    Reviewed: Allergy & Precautions, H&P , NPO status , Patient's Chart, lab work & pertinent test results  Airway Mallampati: I TM Distance: >3 FB Neck ROM: Full    Dental   Pulmonary    Pulmonary exam normal       Cardiovascular     Neuro/Psych  Neuromuscular disease    GI/Hepatic   Endo/Other    Renal/GU      Musculoskeletal   Abdominal   Peds  Hematology   Anesthesia Other Findings   Reproductive/Obstetrics                           Anesthesia Physical Anesthesia Plan  ASA: I  Anesthesia Plan: General   Post-op Pain Management:    Induction: Intravenous  Airway Management Planned: LMA  Additional Equipment:   Intra-op Plan:   Post-operative Plan: Extubation in OR  Informed Consent: I have reviewed the patients History and Physical, chart, labs and discussed the procedure including the risks, benefits and alternatives for the proposed anesthesia with the patient or authorized representative who has indicated his/her understanding and acceptance.     Plan Discussed with: CRNA and Surgeon  Anesthesia Plan Comments:         Anesthesia Quick Evaluation

## 2011-08-28 NOTE — Op Note (Signed)
[name], [name]               ACCOUNT NO.:  [id number]  MEDICAL RECORD NO.:  [id number]  LOCATION:                                 FACILITY:  PHYSICIAN:  Cindee Salt, M.D.            DATE OF BIRTH:  DATE OF PROCEDURE:  08/28/2011 DATE OF DISCHARGE:                              OPERATIVE REPORT   PREOPERATIVE DIAGNOSIS:  Recurrent giant cell tumor, left ring finger.  POSTOPERATIVE DIAGNOSIS:  Recurrent giant cell tumor, left ring finger.  OPERATION:  Excisional biopsy of recurrent mass, PIP joint, left ring finger.  SURGEON:  Cindee Salt, MD.  ANESTHESIA:  General with local metacarpal block.  ANESTHESIOLOGIST:  Bedelia Person, M.D.  HISTORY:  The patient is a 66 year old female with a history of a recurrent giant cell tumor.  This had been excised approximately 2-3 years ago.  She has had a recurrence and she is desirous having this removed.  Pre, peri, postoperative course have been discussed along with the risks and complications.  She is aware there is no guarantee with the surgery; possibility of infection; recurrence; injury to arteries, nerves, tendons; incomplete relief of symptoms; and dystrophy.  In the preoperative area, the patient is seen, the extremity marked by both the patient and surgeon.  Antibiotic given.  PROCEDURE:  The patient was brought to the operating room where a general anesthetic was carried out without difficulty.  She was prepped using ChloraPrep, supine position, left arm free.  A 3-minute dry time was allowed.  Time-out taken, confirming the patient and procedure.  The limb was exsanguinated with an Esmarch bandage.  Tourniquet placed high on the arm and was inflated to 250 mmHg.  A dorsal incision was made, this was longitudinal in nature, using the old incision, carried down through subcutaneous tissue.  The extensor tendon was identified.  A large multilobulated yellow-brown tan tumor was immediately identified. This involved the extensor  tendon, primarily lateral band, going out to the level of the middle phalanx.  With blunt sharp dissection, this was dissected free.  A separate Brunner incision was made on the volar aspect.  Significant scar was immediately encountered from the old incision.  The neurovascular bundle was identified proximally and distally.  The tumor volarly involved the digital nerve and artery. Having the digital nerve displaced over, the operative microscope was brought into position and that the nerve had been somewhat infiltrated by the tumor and in effort to protect the nerve and artery, the operative microscope was used.  This allowed resection of the tumor. This went down into the PIP joint on the volar aspect beneath the collateral ligaments up to the proximal phalanx.  The entire tumor was excised using the operative microscope.  The digital artery and nerve were protected throughout the procedure.  The flexor tendon was not involved.  The dorsal tumor was then completed.  The communication between the dorsal palmar was on the lateral margin.  This was allowed entire resection of the tumor.  This was set into pieces.  The tumor did go to the area of the cystic change on the proximal aspect of the middle phalanx.  This  was localized and it appeared that the tumor was directly above this with a potential involvement of the bone.  The bone was opened with a 045 K-wire.  A mucinous-type fluid immediately extruded. The position of the opening directly over the structure was noted.  This appeared to be a cyst rather than bony involvement.  The wounds were copiously irrigated with saline.  The skin was then closed with interrupted 5-0 Vicryl Rapide sutures.  The tourniquet deflated.  The finger immediately pinked.  The pressure was maintained for hemostasis until the reactive hyperemia disappeared.  A sterile compressive dressing and splint to the finger was then placed after a metacarpal block was  given with 1% Marcaine without epinephrine, a total of 5 mL was used.  The patient tolerated the procedure well and was taken to the recovery room for observation in satisfactory condition.          ______________________________ Cindee Salt, M.D.     GK/MEDQ  D:  08/28/2011  T:  08/28/2011  Job:  [id number]

## 2011-08-28 NOTE — Brief Op Note (Signed)
08/28/2011  10:49 AM  PATIENT:  Rhodia Albright  66 y.o. female  PRE-OPERATIVE DIAGNOSIS:  mass left ring finger  POST-OPERATIVE DIAGNOSIS:  mass left ring finger  PROCEDURE:  Procedure(s) (LRB): EXCISION MASS (Left)  SURGEON:  Surgeon(s) and Role:    * Nicki Reaper, MD - Primary  PHYSICIAN ASSISTANT:   ASSISTANTS: none   ANESTHESIA:   local and general  EBL:  Total I/O In: 1000 [I.V.:1000] Out: -   BLOOD ADMINISTERED:none  DRAINS: none   LOCAL MEDICATIONS USED:  MARCAINE     SPECIMEN:  Excision  DISPOSITION OF SPECIMEN:  PATHOLOGY  COUNTS:  YES  TOURNIQUET:   Total Tourniquet Time Documented: Forearm (Left) - 97 minutes  DICTATION: .Other Dictation: Dictation Number [id number]  PLAN OF CARE: Discharge to home after PACU  PATIENT DISPOSITION:  PACU - hemodynamically stable.

## 2011-08-28 NOTE — Anesthesia Procedure Notes (Signed)
Procedure Name: LMA Insertion Date/Time: 08/28/2011 8:45 AM Performed by: Noble Cicalese D Pre-anesthesia Checklist: Patient identified, Emergency Drugs available, Suction available and Patient being monitored Patient Re-evaluated:Patient Re-evaluated prior to inductionOxygen Delivery Method: Circle System Utilized Preoxygenation: Pre-oxygenation with 100% oxygen Intubation Type: IV induction Ventilation: Mask ventilation without difficulty LMA: LMA inserted LMA Size: 4.0 Number of attempts: 1 Placement Confirmation: positive ETCO2 Tube secured with: Tape Dental Injury: Teeth and Oropharynx as per pre-operative assessment

## 2011-08-28 NOTE — Transfer of Care (Signed)
Immediate Anesthesia Transfer of Care Note  Patient: [name]  Procedure(s) Performed: Procedure(s) (LRB): EXCISION MASS (Left)  Patient Location: PACU  Anesthesia Type: General  Level of Consciousness: awake, alert  and patient cooperative  Airway & Oxygen Therapy: Patient Spontanous Breathing and Patient connected to face mask oxygen  Post-op Assessment: Report given to PACU RN and Post -op Vital signs reviewed and stable  Post vital signs: Reviewed and stable  Complications: No apparent anesthesia complications

## 2011-08-28 NOTE — Anesthesia Postprocedure Evaluation (Signed)
  Anesthesia Post-op Note  Patient: [name]  Procedure(s) Performed: Procedure(s) (LRB): EXCISION MASS (Left)  Patient Location: PACU  Anesthesia Type: General  Level of Consciousness: awake and alert   Airway and Oxygen Therapy: Patient Spontanous Breathing  Post-op Pain: mild  Post-op Assessment: Post-op Vital signs reviewed, Patient's Cardiovascular Status Stable, Respiratory Function Stable, Patent Airway, No signs of Nausea or vomiting and Pain level controlled  Post-op Vital Signs: stable  Complications: No apparent anesthesia complications

## 2011-08-28 NOTE — Op Note (Signed)
Dictated number: [id number]

## 2011-08-28 NOTE — Discharge Instructions (Addendum)
[facility] Instructions Hand Surgery  Wound Care: Keep your hand elevated above the level of your heart.  Do not allow it to dangle  by your side.  Keep the dressing dry and do not remove it unless your doctor advises you to do so.  He will usually change it at the time of your post-op visit.  Moving your fingers is advised to stimulate circulation but will depend on the site of your surgery.  If you have a splint applied, your doctor will advise you regarding movement.  Activity: Do not drive or operate machinery today.  Rest today and then you may return to your normal activity and work as indicated by your physician.  Diet:  Drink liquids today or eat a light diet.  You may resume a regular diet tomorrow.    General expectations: Pain for two to three days. Fingers may become slightly swollen.  Call your doctor if any of the following occur: Severe pain not relieved by pain medication. Elevated temperature. Dressing soaked with blood. Inability to move fingers. White or bluish color to fingers.[facility]  [address] [place], [place] [address] [phone]   Post Anesthesia Home Care Instructions  Activity: Get plenty of rest for the remainder of the day. A responsible adult should stay with you for 24 hours following the procedure.  For the next 24 hours, DO NOT: -Drive a car -Operate machinery -Drink alcoholic beverages -Take any medication unless instructed by your physician -Make any legal decisions or sign important papers.  Meals: Start with liquid foods such as gelatin or soup. Progress to regular foods as tolerated. Avoid greasy, spicy, heavy foods. If nausea and/or vomiting occur, drink only clear liquids until the nausea and/or vomiting subsides. Call your physician if vomiting continues.  Special Instructions/Symptoms: Your throat may feel dry or sore from the anesthesia or the breathing tube placed in your throat during surgery. If  this causes discomfort, gargle with warm salt water. The discomfort should disappear within 24 hours.   

## 2011-08-28 NOTE — H&P (Signed)
[name] returns  today. She has not been seen in two years.  She states that she has had a recurrence of the mass on the PIP joint of her ring finger left hand.  She states that this occurred over the past several months.  She recalls no new history of injury to it. This was excised two years ago revealing a giant cell tumor, very extensive in nature.  She has no new history of injury, no history of diabetes.  She has had her MRI done and this reveals that she has degenerative changes in multiple PIP and DIP joints. The erosions in the PIP joint of her ring finger are cystic in nature rather than tumor. The tumor is on both radial and ulnar aspect of the digit.   PAST MEDICAL HISTORY:   She is on nabumetone, citalopram, multivitamins, red yeast, fish oil, calcium and glucosamine and chondroitin sulfate.  SURGICAL HISTORY:  Back surgery, left knee surgery replacement in 2009.  FAMILY MEDICAL HISTORY:  Negative.  SOCIAL HISTORY:  She does not smoke or drink, she is married and retired.  REVIEW OF SYSTEMS:   Positive for glasses, ringing in her ears, lumps, depression otherwise negative. [name] is an 66 y.o. female.   Chief Complaint: recurrent mass LRF HPI: see above  Past Medical History  Diagnosis Date  . Arthritis     Past Surgical History  Procedure Date  . Back surgery   . Joint replacement     left knee replacement  . Mass removed from left ring finger     History reviewed. No pertinent family history. Social History:  reports that she has never smoked. She does not have any smokeless tobacco history on file. She reports that she does not drink alcohol or use illicit drugs.  Allergies:  Allergies  Allergen Reactions  . Sulfa Antibiotics Hives    Medications Prior to Admission  Medication Dose Route Frequency Provider Last Rate Last Dose  . ceFAZolin (ANCEF) IVPB 1 g/50 mL premix  1 g Intravenous 60 min Pre-Op       . chlorhexidine (HIBICLENS) 4 % liquid 4  application  60 mL Topical Once       . fentaNYL (SUBLIMAZE) injection 50-100 mcg  50-100 mcg Intravenous PRN Bedelia Person, MD      . lactated ringers infusion   Intravenous Continuous Constance Goltz, MD 20 mL/hr at 08/28/11 0820    . midazolam (VERSED) injection 1-2 mg  1-2 mg Intravenous PRN Bedelia Person, MD       Medications Prior to Admission  Medication Sig Dispense Refill  . cholecalciferol (VITAMIN D) 1000 UNITS tablet Take 1,000 Units by mouth daily.      . fish oil-omega-3 fatty acids 1000 MG capsule Take 2 g by mouth daily.      . multivitamin (THERAGRAN) per tablet Take 1 tablet by mouth daily.      . nabumetone (RELAFEN) 500 MG tablet Take 500 mg by mouth 2 (two) times daily.      . vitamin E 100 UNIT capsule Take 100 Units by mouth daily.        No results found for this or any previous visit (from the past 48 hour(s)).  No results found.   Pertinent items are noted in HPI.  Blood pressure 137/72, pulse 66, temperature 97.8 F (36.6 C), temperature source Oral, resp. rate 20, height 5' (1.524 m), weight 53.524 kg (118 lb), SpO2 97.00%.  General appearance: alert, cooperative and appears  stated age Head: Normocephalic, without obvious abnormality Neck: no adenopathy Resp: clear to auscultation bilaterally Cardio: regular rate and rhythm, S1, S2 normal, no murmur, click, rub or gallop GI: soft, non-tender; bowel sounds normal; no masses,  no organomegaly Extremities: extremities normal, atraumatic, no cyanosis or edema Pulses: 2+ and symmetric Skin: Skin color, texture, turgor normal. No rashes or lesions Neurologic: Grossly normal Incision/Wound: na  Assessment/Plan We have discussed with her the possibility of surgical excision. The pre, peri and post op course are discussed along with risks and complications.  This will probably require 2 incisions to adequately remove this. She is aware there is no guarantee with surgery, possibility of infection, recurrence,  injury to arteries, nerves and tendons, incomplete relief of symptoms and dystrophy.  We would not recommend doing anything to the cyst at the present time. She has no history of gout or pseudogout which could be the underlying etiology.   [name] R 08/28/2011, 8:33 AM

## 2011-08-29 ENCOUNTER — Encounter (HOSPITAL_BASED_OUTPATIENT_CLINIC_OR_DEPARTMENT_OTHER): Payer: Self-pay | Admitting: Orthopedic Surgery

## 2012-01-29 ENCOUNTER — Other Ambulatory Visit: Payer: Self-pay | Admitting: Gastroenterology

## 2012-02-05 ENCOUNTER — Other Ambulatory Visit: Payer: Self-pay | Admitting: Orthopedic Surgery

## 2012-02-07 ENCOUNTER — Encounter (HOSPITAL_BASED_OUTPATIENT_CLINIC_OR_DEPARTMENT_OTHER): Payer: Self-pay | Admitting: *Deleted

## 2012-02-11 ENCOUNTER — Encounter (HOSPITAL_BASED_OUTPATIENT_CLINIC_OR_DEPARTMENT_OTHER): Payer: Self-pay | Admitting: *Deleted

## 2012-02-11 ENCOUNTER — Ambulatory Visit (HOSPITAL_BASED_OUTPATIENT_CLINIC_OR_DEPARTMENT_OTHER)
Admission: RE | Admit: 2012-02-11 | Discharge: 2012-02-11 | Disposition: A | Discharge status: Home / Self Care | Payer: Medicare Other | Source: Ambulatory Visit | Attending: Orthopedic Surgery | Admitting: Orthopedic Surgery

## 2012-02-11 ENCOUNTER — Ambulatory Visit (HOSPITAL_BASED_OUTPATIENT_CLINIC_OR_DEPARTMENT_OTHER): Payer: Medicare Other | Admitting: Certified Registered Nurse Anesthetist

## 2012-02-11 ENCOUNTER — Encounter (HOSPITAL_BASED_OUTPATIENT_CLINIC_OR_DEPARTMENT_OTHER): Payer: Self-pay | Admitting: Certified Registered Nurse Anesthetist

## 2012-02-11 ENCOUNTER — Encounter (HOSPITAL_BASED_OUTPATIENT_CLINIC_OR_DEPARTMENT_OTHER)
Admission: RE | Disposition: A | Discharge status: Home / Self Care | Payer: Self-pay | Source: Ambulatory Visit | Attending: Orthopedic Surgery

## 2012-02-11 DIAGNOSIS — M6740 Ganglion, unspecified site: Secondary | ICD-10-CM | POA: Insufficient documentation

## 2012-02-11 HISTORY — DX: Major depressive disorder, single episode, unspecified: F32.9

## 2012-02-11 HISTORY — DX: Depression, unspecified: F32.A

## 2012-02-11 HISTORY — PX: MASS EXCISION: SHX2000

## 2012-02-11 LAB — POCT HEMOGLOBIN-HEMACUE: Hemoglobin: 13.1 g/dL (ref 12.0–15.0)

## 2012-02-11 SURGERY — EXCISION MASS
Anesthesia: Regional | Site: Thumb | Laterality: Left | Wound class: Clean

## 2012-02-11 MED ORDER — BUPIVACAINE HCL (PF) 0.25 % IJ SOLN
INTRAMUSCULAR | Status: DC | PRN
  Administered 2012-02-11: 6 mL

## 2012-02-11 MED ORDER — FENTANYL CITRATE 0.05 MG/ML IJ SOLN
INTRAMUSCULAR | Status: DC | PRN
  Administered 2012-02-11: 25 ug via INTRAVENOUS

## 2012-02-11 MED ORDER — CHLORHEXIDINE GLUCONATE 4 % EX LIQD: 60.0000 mL | Freq: Once | CUTANEOUS | Status: DC

## 2012-02-11 MED ORDER — ONDANSETRON HCL 4 MG/2ML IJ SOLN
INTRAMUSCULAR | Status: DC | PRN
  Administered 2012-02-11: 4 mg via INTRAVENOUS

## 2012-02-11 MED ORDER — 0.9 % SODIUM CHLORIDE (POUR BTL) OPTIME
TOPICAL | Status: DC | PRN
  Administered 2012-02-11: 100 mL

## 2012-02-11 MED ORDER — HYDROCODONE-ACETAMINOPHEN 5-500 MG PO TABS: 1.0000 | ORAL_TABLET | ORAL | Status: AC | PRN

## 2012-02-11 MED ORDER — PROPOFOL 10 MG/ML IV EMUL
INTRAVENOUS | Status: DC | PRN
  Administered 2012-02-11: 75 ug/kg/min via INTRAVENOUS

## 2012-02-11 MED ORDER — LIDOCAINE HCL (PF) 0.5 % IJ SOLN
INTRAMUSCULAR | Status: DC | PRN
  Administered 2012-02-11: 30 mL via INTRATHECAL

## 2012-02-11 MED ORDER — LACTATED RINGERS IV SOLN
INTRAVENOUS | Status: DC
  Administered 2012-02-11 (×2): via INTRAVENOUS

## 2012-02-11 MED ORDER — MIDAZOLAM HCL 5 MG/5ML IJ SOLN
INTRAMUSCULAR | Status: DC | PRN
  Administered 2012-02-11: 1 mg via INTRAVENOUS

## 2012-02-11 MED ORDER — ONDANSETRON HCL 4 MG/2ML IJ SOLN: 4.0000 mg | Freq: Four times a day (QID) | INTRAMUSCULAR | Status: DC | PRN

## 2012-02-11 MED ORDER — CEFAZOLIN SODIUM 1-5 GM-% IV SOLN
INTRAVENOUS | Status: DC | PRN
  Administered 2012-02-11: 1 g via INTRAVENOUS

## 2012-02-11 MED ORDER — FENTANYL CITRATE 0.05 MG/ML IJ SOLN: 25.0000 ug | INTRAMUSCULAR | Status: DC | PRN

## 2012-02-11 MED ORDER — LIDOCAINE HCL (CARDIAC) 20 MG/ML IV SOLN
INTRAVENOUS | Status: DC | PRN
  Administered 2012-02-11: 30 mg via INTRAVENOUS

## 2012-02-11 SURGICAL SUPPLY — 51 items
BANDAGE COBAN STERILE 2 (GAUZE/BANDAGES/DRESSINGS) ×1 IMPLANT
BANDAGE GAUZE ELAST BULKY 4 IN (GAUZE/BANDAGES/DRESSINGS) IMPLANT
BLADE MINI RND TIP GREEN BEAV (BLADE) ×1 IMPLANT
BLADE SURG 15 STRL LF DISP TIS (BLADE) ×1 IMPLANT
BLADE SURG 15 STRL SS (BLADE) ×2
BNDG CMPR 9X4 STRL LF SNTH (GAUZE/BANDAGES/DRESSINGS)
BNDG COHESIVE 1X5 TAN STRL LF (GAUZE/BANDAGES/DRESSINGS) ×1 IMPLANT
BNDG COHESIVE 3X5 TAN STRL LF (GAUZE/BANDAGES/DRESSINGS) IMPLANT
BNDG ESMARK 4X9 LF (GAUZE/BANDAGES/DRESSINGS) IMPLANT
CHLORAPREP W/TINT 26ML (MISCELLANEOUS) ×2 IMPLANT
CLOTH BEACON ORANGE TIMEOUT ST (SAFETY) ×2 IMPLANT
CORDS BIPOLAR (ELECTRODE) ×2 IMPLANT
COVER MAYO STAND STRL (DRAPES) ×2 IMPLANT
COVER TABLE BACK 60X90 (DRAPES) ×2 IMPLANT
CUFF TOURNIQUET SINGLE 18IN (TOURNIQUET CUFF) ×1 IMPLANT
DECANTER SPIKE VIAL GLASS SM (MISCELLANEOUS) IMPLANT
DRAIN PENROSE 1/2X12 LTX STRL (WOUND CARE) IMPLANT
DRAPE EXTREMITY T 121X128X90 (DRAPE) ×2 IMPLANT
DRAPE SURG 17X23 STRL (DRAPES) ×2 IMPLANT
GAUZE XEROFORM 1X8 LF (GAUZE/BANDAGES/DRESSINGS) ×2 IMPLANT
GLOVE BIO SURGEON STRL SZ 6.5 (GLOVE) ×1 IMPLANT
GLOVE ECLIPSE 6.5 STRL STRAW (GLOVE) ×2 IMPLANT
GLOVE INDICATOR 6.5 STRL GRN (GLOVE) ×2 IMPLANT
GLOVE SURG ORTHO 8.0 STRL STRW (GLOVE) ×3 IMPLANT
GOWN BRE IMP PREV XXLGXLNG (GOWN DISPOSABLE) ×3 IMPLANT
GOWN PREVENTION PLUS XLARGE (GOWN DISPOSABLE) ×2 IMPLANT
NDL SAFETY ECLIPSE 18X1.5 (NEEDLE) ×1 IMPLANT
NEEDLE 27GAX1X1/2 (NEEDLE) IMPLANT
NEEDLE HYPO 18GX1.5 SHARP (NEEDLE)
NS IRRIG 1000ML POUR BTL (IV SOLUTION) ×2 IMPLANT
PACK BASIN DAY SURGERY FS (CUSTOM PROCEDURE TRAY) ×2 IMPLANT
PAD CAST 3X4 CTTN HI CHSV (CAST SUPPLIES) IMPLANT
PADDING CAST ABS 3INX4YD NS (CAST SUPPLIES)
PADDING CAST ABS 4INX4YD NS (CAST SUPPLIES) ×1
PADDING CAST ABS COTTON 3X4 (CAST SUPPLIES) IMPLANT
PADDING CAST ABS COTTON 4X4 ST (CAST SUPPLIES) ×1 IMPLANT
PADDING CAST COTTON 3X4 STRL (CAST SUPPLIES)
SPLINT FINGER 5/8X3.25 (SOFTGOODS) IMPLANT
SPLINT FINGER FOAM 3 9119 05 (SOFTGOODS) ×2
SPLINT PLASTER CAST XFAST 3X15 (CAST SUPPLIES) IMPLANT
SPLINT PLASTER XTRA FASTSET 3X (CAST SUPPLIES)
SPONGE GAUZE 4X4 12PLY (GAUZE/BANDAGES/DRESSINGS) ×2 IMPLANT
STOCKINETTE 4X48 STRL (DRAPES) ×2 IMPLANT
SUT VIC AB 4-0 P2 18 (SUTURE) IMPLANT
SUT VICRYL RAPID 5 0 P 3 (SUTURE) IMPLANT
SUT VICRYL RAPIDE 4/0 PS 2 (SUTURE) ×2 IMPLANT
SYR BULB 3OZ (MISCELLANEOUS) ×2 IMPLANT
SYR CONTROL 10ML LL (SYRINGE) IMPLANT
TOWEL OR 17X24 6PK STRL BLUE (TOWEL DISPOSABLE) ×3 IMPLANT
UNDERPAD 30X30 INCONTINENT (UNDERPADS AND DIAPERS) ×2 IMPLANT
WATER STERILE IRR 1000ML POUR (IV SOLUTION) ×1 IMPLANT

## 2012-02-11 NOTE — Anesthesia Preprocedure Evaluation (Signed)
Anesthesia Evaluation  Patient identified by MRN, date of birth, ID band Patient awake    Reviewed: Allergy & Precautions, H&P , NPO status , Patient's Chart, lab work & pertinent test results  Airway Mallampati: II  Neck ROM: full    Dental   Pulmonary          Cardiovascular     Neuro/Psych Depression    GI/Hepatic   Endo/Other    Renal/GU      Musculoskeletal  (+) Arthritis -,   Abdominal   Peds  Hematology   Anesthesia Other Findings   Reproductive/Obstetrics                           Anesthesia Physical Anesthesia Plan  ASA: II  Anesthesia Plan: Bier Block   Post-op Pain Management:    Induction: Intravenous  Airway Management Planned: Simple Face Mask  Additional Equipment:   Intra-op Plan:   Post-operative Plan:   Informed Consent: I have reviewed the patients History and Physical, chart, labs and discussed the procedure including the risks, benefits and alternatives for the proposed anesthesia with the patient or authorized representative who has indicated his/her understanding and acceptance.     Plan Discussed with: CRNA and Surgeon  Anesthesia Plan Comments:         Anesthesia Quick Evaluation

## 2012-02-11 NOTE — Op Note (Signed)
Dictated [id number]

## 2012-02-11 NOTE — Anesthesia Postprocedure Evaluation (Signed)
Anesthesia Post Note  Patient: [name]  Procedure(s) Performed: Procedure(s) (LRB): EXCISION MASS (Left)  Anesthesia type: MAC and Bier block  Patient location: PACU  Post pain: Pain level controlled and Adequate analgesia  Post assessment: Post-op Vital signs reviewed, Patient's Cardiovascular Status Stable and Respiratory Function Stable  Last Vitals:  Filed Vitals:   02/11/12 1010  BP:   Pulse: 64  Temp: 36.6 C  Resp: 14    Post vital signs: Reviewed and stable  Level of consciousness: awake, alert  and oriented  Complications: No apparent anesthesia complications

## 2012-02-11 NOTE — Brief Op Note (Signed)
02/11/2012  10:07 AM  PATIENT:  Rhodia Albright  66 y.o. female  PRE-OPERATIVE DIAGNOSIS:  MUCOID TUMOR LEFT THUMB  POST-OPERATIVE DIAGNOSIS:  MUCOID TUMOR LEFT THUMB  PROCEDURE:  Procedure(s) (LRB): EXCISION MASS (Left)  SURGEON:  Surgeon(s) and Role:    * Nicki Reaper, MD - Primary  PHYSICIAN ASSISTANT:   ASSISTANTS: K Crystallynn Noorani,MD   ANESTHESIA:   local and regional  EBL:  Total I/O In: 1000 [I.V.:1000] Out: -   BLOOD ADMINISTERED:none  DRAINS: none   LOCAL MEDICATIONS USED:  MARCAINE     SPECIMEN:  No Specimen  DISPOSITION OF SPECIMEN:  N/A  COUNTS:  YES  TOURNIQUET:   Total Tourniquet Time Documented: Forearm (Left) - 21 minutes  DICTATION: .Other Dictation: Dictation Number [id number]  PLAN OF CARE: Discharge to home after PACU  PATIENT DISPOSITION:  PACU - hemodynamically stable.

## 2012-02-11 NOTE — Anesthesia Procedure Notes (Signed)
Procedure Name: MAC Date/Time: 02/11/2012 9:45 AM Performed by: Makinzie Considine D Pre-anesthesia Checklist: Patient identified, Timeout performed, Emergency Drugs available, Suction available and Patient being monitored Patient Re-evaluated:Patient Re-evaluated prior to inductionOxygen Delivery Method: Simple face mask

## 2012-02-11 NOTE — Op Note (Signed)

## 2012-02-11 NOTE — H&P (Signed)
[name] is 66 year old.    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 This is doing well.  She has regained her preoperative mobility.  She is not quite able to make composite fist, but she does have a new problem.  She is complaining of a mass over the IP joint of her left thumb. She states that this has gradually enlarged. It does transilluminate.  She has no new history of injury, no history of diabetes.  PAST MEDICAL HISTORY:   She is on nabumetone, citalopram, multivitamins, red yeast, fish oil, calcium and glucosamine and chondroitin sulfate.  SURGICAL HISTORY:  Back surgery, left knee surgery replacement in 2009.  FAMILY MEDICAL HISTORY:  Negative.  SOCIAL HISTORY:  She does not smoke or drink, she is married and retired.  REVIEW OF SYSTEMS:   Positive for glasses, ringing in her ears, lumps, depression otherwise negative. [name] is an 66 y.o. female.   Chief Complaint: Mass left thumb HPI: see above  Past Medical History  Diagnosis Date  . Arthritis     lower back & knees  . Depression     mild- meds for about 4 years    Past Surgical History  Procedure Date  . Mass removed from left ring finger   . Mass excision twice: 08/28/2011; 2012    Procedure: EXCISION MASS;  Surgeon: Nicki Reaper, MD;  Location: [facility];  Service: Orthopedics;  Laterality: Left;  excision mass left ring finger  . Joint replacement 2006    left knee replacement  . Back surgery ~10 yrs ago    fusion w/ rods & screws- lower back    History reviewed. No pertinent family history. Social History:  reports that she has never smoked. She does not have any smokeless tobacco history on file. She reports that she does not drink alcohol or use illicit drugs.  Allergies:  Allergies  Allergen Reactions  . Sulfa Antibiotics Hives    Medications Prior to Admission  Medication Sig Dispense Refill  . Biotin 5000 MCG CAPS Take by mouth 2 (two) times  daily.      Marland Kitchen buPROPion (WELLBUTRIN XL) 150 MG 24 hr tablet Take 150 mg by mouth daily.      . cholecalciferol (VITAMIN D) 1000 UNITS tablet Take 1,000 Units by mouth daily.      . fish oil-omega-3 fatty acids 1000 MG capsule Take 2 g by mouth daily.      . multivitamin (THERAGRAN) per tablet Take 1 tablet by mouth daily.      . nabumetone (RELAFEN) 500 MG tablet Take 500 mg by mouth 2 (two) times daily.      . vitamin E 100 UNIT capsule Take 100 Units by mouth daily.        No results found for this or any previous visit (from the past 48 hour(s)).  No results found.   Pertinent items are noted in HPI.  Height 5\' 1"  (1.549 m), weight 54.432 kg (120 lb).  General appearance: alert, cooperative and appears stated age Head: Normocephalic, without obvious abnormality Neck: no adenopathy Resp: clear to auscultation bilaterally Cardio: regular rate and rhythm, S1, S2 normal, no murmur, click, rub or gallop GI: soft, non-tender; bowel sounds normal; no masses,  no organomegaly Extremities: extremities normal, atraumatic, no cyanosis or edema Pulses: 2+ and symmetric Skin: Skin color, texture, turgor normal. No rashes or lesions Neurologic: Grossly normal Incision/Wound: na  Assessment/Plan DIAGNOSIS:  Mucoid cyst with degenerative arthritis IP joint left thumb.  RECOMMENDATIONS/PLAN:   We would recommend surgical excision of the cyst along with debridement of the joint. She has elected to proceed to have this done. She is aware of risks and complications including infection, recurrence, injury to arteries, nerves and tendons. This will be scheduled as an outpatient under regional anesthesia.    [name] R 02/11/2012, 7:37 AM

## 2012-02-11 NOTE — Transfer of Care (Signed)
Immediate Anesthesia Transfer of Care Note  Patient: [name]  Procedure(s) Performed: Procedure(s) (LRB): EXCISION MASS (Left)  Patient Location: PACU  Anesthesia Type: Bier block  Level of Consciousness: awake, alert , oriented and patient cooperative  Airway & Oxygen Therapy: Patient Spontanous Breathing and Patient connected to face mask oxygen  Post-op Assessment: Report given to PACU RN and Post -op Vital signs reviewed and stable  Post vital signs: Reviewed and stable  Complications: No apparent anesthesia complications

## 2012-02-12 ENCOUNTER — Encounter (HOSPITAL_BASED_OUTPATIENT_CLINIC_OR_DEPARTMENT_OTHER): Payer: Self-pay | Admitting: Orthopedic Surgery

## 2012-02-12 NOTE — Op Note (Signed)
NAMEVARNIKA, BUTZ.:  [id number]  MEDICAL RECORD NO.:  [id number]  LOCATION:                                 FACILITY:  PHYSICIAN:  Cindee Salt, M.D.            DATE OF BIRTH:  DATE OF PROCEDURE:  02/11/2012 DATE OF DISCHARGE:                              OPERATIVE REPORT   PREOPERATIVE DIAGNOSIS:  Mucoid cyst; degenerative arthritis, interphalangeal joint, left thumb.  POSTOPERATIVE DIAGNOSIS:  Degenerative arthritis with massive swelling interphalangeal joint, left thumb.  OPERATION:  Debridement of osteophytes, synovectomy interphalangeal joint, left thumb.  SURGEON:  Cindee Salt, M.D.  ASSISTANT:  Betha Loa, MD  ANESTHESIA:  Forearm-based IV regional with metacarpal block.  HISTORY:  The patient is a 22-year-old female with a history of large mass over the dorsal aspect PIP joint of her thumb left side.  This does transilluminate.  She is admitted now for excision of probable mucoid cyst, debridement of distal interphalangeal joint.  X-rays revealed osteophyte formation both proximal and distal phalanges.  She is aware that there is no guarantee with surgery, possibility of infection, recurrence, injury to arteries, nerves, tendons, incomplete relief of symptoms, and dystrophy.  In the preoperative area, the patient is seen, the extremity marked by both the patient and surgeon.  Antibiotic given.  PROCEDURE:  The patient was brought to the operating room where a forearm-based IV regional anesthetic was carried out without difficulty. She was prepped using ChloraPrep, supine position with left arm free.  A 3-minute dry time was allowed.  Time-out taken, confirming the patient, procedure.  Curvilinear incision was made over the proximal phalanx curved across the interphalangeal joint, carried down through subcutaneous tissue.  Mass of swelling of the IP joint was encountered. No discreet cyst was seen.  The joint was opened on its radial  aspect. A large osteophyte on the proximal phalanx was then removed along with a complete synovectomy of the joint.  The area was smoothed with a rongeur, bringing this back down to the level of the dorsal cortex.  The wound was copiously irrigated with saline.  The skin then closed with interrupted 4-0 Vicryl Rapide sutures.  A local infiltration metacarpal block with 0.25% Marcaine without epinephrine 5 mL was used. A sterile compressive dressing and splint to the IP joint applied.  On deflation of the tourniquet, all fingers immediately pinked.  She was taken to the recovery room.          ______________________________ Cindee Salt, M.D.     GK/MEDQ  D:  02/11/2012  T:  02/12/2012  Job:  [id number]

## 2013-07-21 ENCOUNTER — Encounter (HOSPITAL_BASED_OUTPATIENT_CLINIC_OR_DEPARTMENT_OTHER): Payer: Self-pay | Admitting: *Deleted

## 2013-07-21 NOTE — Progress Notes (Signed)
No labs needed

## 2013-07-26 ENCOUNTER — Encounter (HOSPITAL_BASED_OUTPATIENT_CLINIC_OR_DEPARTMENT_OTHER)
Admission: RE | Disposition: A | Discharge status: Home / Self Care | Payer: Self-pay | Source: Ambulatory Visit | Attending: Orthopedic Surgery

## 2013-07-26 ENCOUNTER — Other Ambulatory Visit: Payer: Self-pay | Admitting: Physician Assistant

## 2013-07-26 ENCOUNTER — Encounter (HOSPITAL_BASED_OUTPATIENT_CLINIC_OR_DEPARTMENT_OTHER): Payer: Medicare Other | Admitting: Anesthesiology

## 2013-07-26 ENCOUNTER — Ambulatory Visit (HOSPITAL_BASED_OUTPATIENT_CLINIC_OR_DEPARTMENT_OTHER)
Admission: RE | Admit: 2013-07-26 | Discharge: 2013-07-26 | Disposition: A | Discharge status: Home / Self Care | Payer: Medicare Other | Source: Ambulatory Visit | Attending: Orthopedic Surgery | Admitting: Orthopedic Surgery

## 2013-07-26 ENCOUNTER — Encounter (HOSPITAL_BASED_OUTPATIENT_CLINIC_OR_DEPARTMENT_OTHER): Payer: Self-pay

## 2013-07-26 ENCOUNTER — Ambulatory Visit (HOSPITAL_BASED_OUTPATIENT_CLINIC_OR_DEPARTMENT_OTHER): Payer: Medicare Other | Admitting: Anesthesiology

## 2013-07-26 DIAGNOSIS — M7580 Other shoulder lesions, unspecified shoulder: Secondary | ICD-10-CM

## 2013-07-26 DIAGNOSIS — M19019 Primary osteoarthritis, unspecified shoulder: Secondary | ICD-10-CM | POA: Insufficient documentation

## 2013-07-26 DIAGNOSIS — F329 Major depressive disorder, single episode, unspecified: Secondary | ICD-10-CM | POA: Insufficient documentation

## 2013-07-26 DIAGNOSIS — M249 Joint derangement, unspecified: Secondary | ICD-10-CM | POA: Insufficient documentation

## 2013-07-26 DIAGNOSIS — M25519 Pain in unspecified shoulder: Secondary | ICD-10-CM | POA: Insufficient documentation

## 2013-07-26 DIAGNOSIS — Z882 Allergy status to sulfonamides status: Secondary | ICD-10-CM | POA: Insufficient documentation

## 2013-07-26 DIAGNOSIS — F3289 Other specified depressive episodes: Secondary | ICD-10-CM | POA: Insufficient documentation

## 2013-07-26 DIAGNOSIS — M25819 Other specified joint disorders, unspecified shoulder: Secondary | ICD-10-CM | POA: Insufficient documentation

## 2013-07-26 HISTORY — PX: SHOULDER ARTHROSCOPY WITH ROTATOR CUFF REPAIR AND SUBACROMIAL DECOMPRESSION: SHX5686

## 2013-07-26 LAB — POCT HEMOGLOBIN-HEMACUE: Hemoglobin: 13.3 g/dL (ref 12.0–15.0)

## 2013-07-26 SURGERY — SHOULDER ARTHROSCOPY WITH ROTATOR CUFF REPAIR AND SUBACROMIAL DECOMPRESSION
Anesthesia: General | Site: Shoulder | Laterality: Left

## 2013-07-26 MED ORDER — OXYCODONE HCL 5 MG PO TABS: 5.0000 mg | ORAL_TABLET | Freq: Once | ORAL | Status: DC | PRN

## 2013-07-26 MED ORDER — MIDAZOLAM HCL 2 MG/2ML IJ SOLN
INTRAMUSCULAR | Status: AC
  Filled 2013-07-26: qty 2

## 2013-07-26 MED ORDER — PROPOFOL 10 MG/ML IV BOLUS
INTRAVENOUS | Status: DC | PRN
  Administered 2013-07-26: 150 mg via INTRAVENOUS

## 2013-07-26 MED ORDER — METHYLPREDNISOLONE ACETATE 80 MG/ML IJ SUSP
INTRAMUSCULAR | Status: DC | PRN
  Administered 2013-07-26: 80 mg

## 2013-07-26 MED ORDER — FENTANYL CITRATE 0.05 MG/ML IJ SOLN
50.0000 ug | INTRAMUSCULAR | Status: DC | PRN
Start: 2013-07-26 — End: 2013-07-26
  Administered 2013-07-26: 100 ug via INTRAVENOUS

## 2013-07-26 MED ORDER — FENTANYL CITRATE 0.05 MG/ML IJ SOLN
INTRAMUSCULAR | Status: AC
  Filled 2013-07-26: qty 2

## 2013-07-26 MED ORDER — LIDOCAINE HCL (CARDIAC) 20 MG/ML IV SOLN
INTRAVENOUS | Status: DC | PRN
  Administered 2013-07-26: 50 mg via INTRAVENOUS

## 2013-07-26 MED ORDER — OXYCODONE HCL 5 MG/5ML PO SOLN: 5.0000 mg | Freq: Once | ORAL | Status: DC | PRN

## 2013-07-26 MED ORDER — FENTANYL CITRATE 0.05 MG/ML IJ SOLN
INTRAMUSCULAR | Status: AC
Start: 2013-07-26 — End: 2013-07-26
  Filled 2013-07-26: qty 2

## 2013-07-26 MED ORDER — MIDAZOLAM HCL 2 MG/2ML IJ SOLN
1.0000 mg | INTRAMUSCULAR | Status: DC | PRN
  Administered 2013-07-26: 2 mg via INTRAVENOUS

## 2013-07-26 MED ORDER — PROMETHAZINE HCL 25 MG/ML IJ SOLN: 6.2500 mg | INTRAMUSCULAR | Status: DC | PRN

## 2013-07-26 MED ORDER — CHLORHEXIDINE GLUCONATE 4 % EX LIQD: 60.0000 mL | Freq: Once | CUTANEOUS | Status: DC

## 2013-07-26 MED ORDER — DEXAMETHASONE SODIUM PHOSPHATE 4 MG/ML IJ SOLN
INTRAMUSCULAR | Status: DC | PRN
  Administered 2013-07-26: 10 mg via INTRAVENOUS

## 2013-07-26 MED ORDER — DEXAMETHASONE SODIUM PHOSPHATE 10 MG/ML IJ SOLN
INTRAMUSCULAR | Status: DC | PRN
  Administered 2013-07-26: 6 mg

## 2013-07-26 MED ORDER — CEFAZOLIN SODIUM-DEXTROSE 2-3 GM-% IV SOLR
INTRAVENOUS | Status: AC
  Filled 2013-07-26: qty 50

## 2013-07-26 MED ORDER — BUPIVACAINE-EPINEPHRINE PF 0.5-1:200000 % IJ SOLN
INTRAMUSCULAR | Status: DC | PRN
  Administered 2013-07-26: 100 mg via PERINEURAL

## 2013-07-26 MED ORDER — ONDANSETRON HCL 4 MG/2ML IJ SOLN
INTRAMUSCULAR | Status: DC | PRN
  Administered 2013-07-26: 4 mg via INTRAVENOUS

## 2013-07-26 MED ORDER — LACTATED RINGERS IV SOLN
INTRAVENOUS | Status: DC
Start: 2013-07-26 — End: 2013-07-26
  Administered 2013-07-26: 12:00:00 via INTRAVENOUS

## 2013-07-26 MED ORDER — EPHEDRINE SULFATE 50 MG/ML IJ SOLN
INTRAMUSCULAR | Status: DC | PRN
  Administered 2013-07-26: 10 mg via INTRAVENOUS

## 2013-07-26 MED ORDER — CEFAZOLIN SODIUM-DEXTROSE 2-3 GM-% IV SOLR
2.0000 g | INTRAVENOUS | Status: AC
  Administered 2013-07-26: 2 g via INTRAVENOUS

## 2013-07-26 MED ORDER — SODIUM CHLORIDE 0.9 % IV SOLN: INTRAVENOUS | Status: DC

## 2013-07-26 MED ORDER — SUCCINYLCHOLINE CHLORIDE 20 MG/ML IJ SOLN
INTRAMUSCULAR | Status: DC | PRN
  Administered 2013-07-26: 100 mg via INTRAVENOUS

## 2013-07-26 MED ORDER — BUPIVACAINE-EPINEPHRINE 0.5% -1:200000 IJ SOLN
INTRAMUSCULAR | Status: DC | PRN
  Administered 2013-07-26: 5 mL

## 2013-07-26 MED ORDER — SODIUM CHLORIDE 0.9 % IR SOLN
Status: DC | PRN
  Administered 2013-07-26: 8000 mL

## 2013-07-26 MED ORDER — BUPIVACAINE-EPINEPHRINE PF 0.5-1:200000 % IJ SOLN
INTRAMUSCULAR | Status: AC
  Filled 2013-07-26: qty 30

## 2013-07-26 MED ORDER — HYDROMORPHONE HCL PF 1 MG/ML IJ SOLN
0.2500 mg | INTRAMUSCULAR | Status: DC | PRN
Start: 2013-07-26 — End: 2013-07-26

## 2013-07-26 MED ORDER — METHYLPREDNISOLONE ACETATE 80 MG/ML IJ SUSP
INTRAMUSCULAR | Status: AC
  Filled 2013-07-26: qty 1

## 2013-07-26 SURGICAL SUPPLY — 89 items
APL SKNCLS STERI-STRIP NONHPOA (GAUZE/BANDAGES/DRESSINGS)
BENZOIN TINCTURE PRP APPL 2/3 (GAUZE/BANDAGES/DRESSINGS) IMPLANT
BLADE 4.2CUDA (BLADE) ×3 IMPLANT
BLADE AVERAGE 25MMX9MM (BLADE)
BLADE AVERAGE 25X9 (BLADE) IMPLANT
BLADE CUTTER GATOR 3.5 (BLADE) IMPLANT
BLADE SURG 10 STRL SS (BLADE) IMPLANT
BLADE SURG 15 STRL LF DISP TIS (BLADE) IMPLANT
BLADE SURG 15 STRL SS (BLADE)
BLADE VORTEX 6.0 (BLADE) IMPLANT
BUR 3.5 LG SPHERICAL (BURR) IMPLANT
BUR EGG 3PK/BX (BURR) IMPLANT
BUR OVAL 6.0 (BURR) ×2 IMPLANT
BUR VERTEX HOODED 4.5 (BURR) IMPLANT
BURR 3.5 LG SPHERICAL (BURR)
BURR 3.5MM LG SPHERICAL (BURR)
CANISTER SUCT 3000ML (MISCELLANEOUS) IMPLANT
CANISTER SUCT LVC 12 LTR MEDI- (MISCELLANEOUS) ×3 IMPLANT
CANNULA SHOULDER 7CM (CANNULA) ×3 IMPLANT
CANNULA TWIST IN 8.25X7CM (CANNULA) IMPLANT
CLEANER CAUTERY TIP 5X5 PAD (MISCELLANEOUS) IMPLANT
CLOSURE WOUND 1/2 X4 (GAUZE/BANDAGES/DRESSINGS)
CUTTER MENISCUS  4.2MM (BLADE)
CUTTER MENISCUS 4.2MM (BLADE) IMPLANT
DECANTER SPIKE VIAL GLASS SM (MISCELLANEOUS) IMPLANT
DRAPE STERI 35X30 U-POUCH (DRAPES) ×3 IMPLANT
DRAPE SURG 17X23 STRL (DRAPES) ×3 IMPLANT
DRAPE U-SHAPE 76X120 STRL (DRAPES) ×6 IMPLANT
DRSG EMULSION OIL 3X3 NADH (GAUZE/BANDAGES/DRESSINGS) ×2 IMPLANT
DURAPREP 26ML APPLICATOR (WOUND CARE) ×3 IMPLANT
ELECT REM PT RETURN 9FT ADLT (ELECTROSURGICAL) ×3
ELECTRODE REM PT RTRN 9FT ADLT (ELECTROSURGICAL) ×1 IMPLANT
GLOVE BIO SURGEON STRL SZ 6.5 (GLOVE) ×1 IMPLANT
GLOVE BIO SURGEON STRL SZ7.5 (GLOVE) IMPLANT
GLOVE BIO SURGEONS STRL SZ 6.5 (GLOVE) ×1
GLOVE BIOGEL M STRL SZ7.5 (GLOVE) ×2 IMPLANT
GLOVE BIOGEL PI IND STRL 7.5 (GLOVE) IMPLANT
GLOVE BIOGEL PI IND STRL 8 (GLOVE) ×1 IMPLANT
GLOVE BIOGEL PI IND STRL 8.5 (GLOVE) IMPLANT
GLOVE BIOGEL PI INDICATOR 7.5 (GLOVE)
GLOVE BIOGEL PI INDICATOR 8 (GLOVE) ×2
GLOVE BIOGEL PI INDICATOR 8.5 (GLOVE)
GLOVE SURG ORTHO 7.0 STRL STRW (GLOVE) IMPLANT
GLOVE SURG ORTHO 8.0 STRL STRW (GLOVE) ×3 IMPLANT
GOWN STRL REUS W/ TWL LRG LVL3 (GOWN DISPOSABLE) IMPLANT
GOWN STRL REUS W/ TWL XL LVL3 (GOWN DISPOSABLE) ×1 IMPLANT
GOWN STRL REUS W/TWL LRG LVL3 (GOWN DISPOSABLE)
GOWN STRL REUS W/TWL XL LVL3 (GOWN DISPOSABLE) ×6
NDL 1/2 CIR CATGUT .05X1.09 (NEEDLE) IMPLANT
NDL SCORPION MULTI FIRE (NEEDLE) ×1 IMPLANT
NEEDLE 1/2 CIR CATGUT .05X1.09 (NEEDLE) IMPLANT
NEEDLE SCORPION MULTI FIRE (NEEDLE) ×3 IMPLANT
NS IRRIG 1000ML POUR BTL (IV SOLUTION) ×3 IMPLANT
PACK ARTHROSCOPY DSU (CUSTOM PROCEDURE TRAY) ×3 IMPLANT
PACK BASIN DAY SURGERY FS (CUSTOM PROCEDURE TRAY) ×3 IMPLANT
PAD ABD 8X10 STRL (GAUZE/BANDAGES/DRESSINGS) ×3 IMPLANT
PAD CLEANER CAUTERY TIP 5X5 (MISCELLANEOUS)
PENCIL BUTTON HOLSTER BLD 10FT (ELECTRODE) IMPLANT
SET ARTHROSCOPY TUBING (MISCELLANEOUS) ×3
SET ARTHROSCOPY TUBING LN (MISCELLANEOUS) ×1 IMPLANT
SLING ARM FOAM STRAP MED (SOFTGOODS) IMPLANT
SLING ARM LRG ADULT FOAM STRAP (SOFTGOODS) IMPLANT
SLING ULTRA II LARGE (SOFTGOODS) IMPLANT
SLING ULTRA II MEDIUM (SOFTGOODS) ×2 IMPLANT
SLING ULTRA II SMALL (SOFTGOODS) IMPLANT
SPONGE GAUZE 4X4 12PLY (GAUZE/BANDAGES/DRESSINGS) ×3 IMPLANT
SPONGE LAP 4X18 X RAY DECT (DISPOSABLE) IMPLANT
STAPLER VISISTAT 35W (STAPLE) IMPLANT
STRIP CLOSURE SKIN 1/2X4 (GAUZE/BANDAGES/DRESSINGS) IMPLANT
SUCTION FRAZIER TIP 10 FR DISP (SUCTIONS) IMPLANT
SUT BONE WAX W31G (SUTURE) IMPLANT
SUT ETHIBOND 2 OS 4 DA (SUTURE) IMPLANT
SUT ETHILON 4 0 PS 2 18 (SUTURE) ×3 IMPLANT
SUT FIBERWIRE #2 38 T-5 BLUE (SUTURE)
SUT PROLENE 3 0 PS 2 (SUTURE) IMPLANT
SUT TICRON 1 T 12 (SUTURE) IMPLANT
SUT TIGER TAPE 7 IN WHITE (SUTURE) ×3 IMPLANT
SUT VIC AB 0 CT1 27 (SUTURE)
SUT VIC AB 0 CT1 27XBRD ANBCTR (SUTURE) IMPLANT
SUT VIC AB 1 CT1 27 (SUTURE)
SUT VIC AB 1 CT1 27XBRD ANBCTR (SUTURE) IMPLANT
SUT VIC AB 2-0 PS2 27 (SUTURE) IMPLANT
SUT VIC AB 2-0 SH 27 (SUTURE)
SUT VIC AB 2-0 SH 27XBRD (SUTURE) IMPLANT
SUTURE FIBERWR #2 38 T-5 BLUE (SUTURE) IMPLANT
TOWEL OR 17X24 6PK STRL BLUE (TOWEL DISPOSABLE) ×3 IMPLANT
WAND STAR VAC 90 (SURGICAL WAND) ×2 IMPLANT
WATER STERILE IRR 1000ML POUR (IV SOLUTION) ×3 IMPLANT
YANKAUER SUCT BULB TIP NO VENT (SUCTIONS) IMPLANT

## 2013-07-26 NOTE — H&P (View-Only) (Signed)
[name] is an 68 y.o. female.   Chief Complaint: left shoulder pain HPI: patient been followed for left shoulder pain approx 3+ months duration, failing conservative treatment with po NSAIDs, pain meds, corticosteroid injection.  We obtained MRI which shows cuff tendinopathy with some interstitial tearing, significant glenohumeral OA and moderate AC arthritis.  She continues with pain wishes to proceed with surgery.  Past Medical History  Diagnosis Date  . Arthritis     lower back & knees  . Depression     mild- meds for about 4 years  . Wears glasses   . Wears hearing aid     right ear    Past Surgical History  Procedure Laterality Date  . Mass removed from left ring finger  2013  . Mass excision  twice: 08/28/2011; 2012    Procedure: EXCISION MASS;  Surgeon: Wynonia Sours, MD;  Location: [facility];  Service: Orthopedics;  Laterality: Left;  excision mass left ring finger  . Mass excision  02/11/2012    Procedure: EXCISION MASS;  Surgeon: Wynonia Sours, MD;  Location: [facility];  Service: Orthopedics;  Laterality: Left;  EXCISION MUCOID CYST, DEBRIDEMENT INTERPHALANGEAL JOINT LEFT THUMB  . Joint replacement  2009    left knee replacement  . Back surgery  2007    lumbar fusion w/ rods & screws- lower back  . Cyst excision  2005    lumbar cyst   Family History: positive for arthritis, negative for DM, HTN, heart disease Social History:  reports that she has never smoked. She does not have any smokeless tobacco history on file. She reports that she does not drink alcohol or use illicit drugs. Medications: Citalopram, Nabumetone Allergies:  Allergies  Allergen Reactions  . Sulfa Antibiotics Hives    Review of Systems  Constitutional: Negative.   HENT: Positive for hearing loss and tinnitus. Negative for congestion, ear discharge, ear pain, nosebleeds and sore throat.   Eyes: Negative.   Respiratory: Negative.  Negative for stridor.    Cardiovascular: Negative.   Gastrointestinal: Negative.   Genitourinary: Negative.   Musculoskeletal: Positive for joint pain.  Skin: Negative.   Neurological: Negative.  Negative for headaches.  Endo/Heme/Allergies: Negative.   Psychiatric/Behavioral: Positive for depression.    There were no vitals taken for this visit. Physical Exam  Constitutional: She is oriented to person, place, and time. She appears well-developed and well-nourished. No distress.  HENT:  Head: Normocephalic and atraumatic.  Nose: Nose normal.  Eyes: Conjunctivae and EOM are normal. Pupils are equal, round, and reactive to light.  Neck: Normal range of motion. Neck supple.  Cardiovascular: Normal rate and intact distal pulses.   Respiratory: Effort normal. No respiratory distress.  GI: Soft. She exhibits no distension.  Musculoskeletal:       Left shoulder: She exhibits decreased range of motion, tenderness, bony tenderness, pain and decreased strength.  Neurological: She is alert and oriented to person, place, and time. No cranial nerve deficit.  Skin: Skin is warm and dry. No rash noted. No erythema.  Psychiatric: She has a normal mood and affect. Her behavior is normal.     Assessment/Plan left shoulder impingement with underlying OA and interstitial cuff tears  We discussed risks and benefits of left shoulder arthroscopy debridement, acromioplasty, distal clavicle excision, possible rotator cuff repair and patient wishes to proceed.  This will be done at [facility] outpatient surgical center.    Chriss Czar 07/26/2013, 10:24 AM

## 2013-07-26 NOTE — Transfer of Care (Signed)
Immediate Anesthesia Transfer of Care Note  Patient: [name]  Procedure(s) Performed: Procedure(s): LEFT SHOULDER ARTHROSCOPY WITH /SUBACROMIAL DECOMPRESSION AND DISTAL CLAVICLE RESECTION (Left)  Patient Location: PACU  Anesthesia Type:General  Level of Consciousness: awake and sedated  Airway & Oxygen Therapy: Patient Spontanous Breathing and Patient connected to face mask oxygen  Post-op Assessment: Report given to PACU RN and Post -op Vital signs reviewed and stable  Post vital signs: Reviewed and stable  Complications: No apparent anesthesia complications

## 2013-07-26 NOTE — Interval H&P Note (Signed)
History and Physical Interval Note:  07/26/2013 11:55 AM  [name]  has presented today for surgery, with the diagnosis of rotator cuff tear/Osteoarthritis of the Left Shoulder/Impingement  The various methods of treatment have been discussed with the patient and family. After consideration of risks, benefits and other options for treatment, the patient has consented to  Procedure(s): LEFT SHOULDER ARTHROSCOPY WITH OPEN ROTATOR CUFF REPAIR/SUBACROMIAL DECOMPRESSION AND DISTAL CLAVICLE RESECTION (Left) as a surgical intervention .  The patient's history has been reviewed, patient examined, no change in status, stable for surgery.  I have reviewed the patient's chart and labs.  Questions were answered to the patient's satisfaction.     Chrles Selley JR,W D

## 2013-07-26 NOTE — H&P (Signed)
[name] is an 68 y.o. female.   Chief Complaint: left shoulder pain HPI: patient been followed for left shoulder pain approx 3+ months duration, failing conservative treatment with po NSAIDs, pain meds, corticosteroid injection.  We obtained MRI which shows cuff tendinopathy with some interstitial tearing, significant glenohumeral OA and moderate AC arthritis.  She continues with pain wishes to proceed with surgery.  Past Medical History  Diagnosis Date  . Arthritis     lower back & knees  . Depression     mild- meds for about 4 years  . Wears glasses   . Wears hearing aid     right ear    Past Surgical History  Procedure Laterality Date  . Mass removed from left ring finger  2013  . Mass excision  twice: 08/28/2011; 2012    Procedure: EXCISION MASS;  Surgeon: Gary R Kuzma, MD;  Location: [facility];  Service: Orthopedics;  Laterality: Left;  excision mass left ring finger  . Mass excision  02/11/2012    Procedure: EXCISION MASS;  Surgeon: Gary R Kuzma, MD;  Location: [facility];  Service: Orthopedics;  Laterality: Left;  EXCISION MUCOID CYST, DEBRIDEMENT INTERPHALANGEAL JOINT LEFT THUMB  . Joint replacement  2009    left knee replacement  . Back surgery  2007    lumbar fusion w/ rods & screws- lower back  . Cyst excision  2005    lumbar cyst   Family History: positive for arthritis, negative for DM, HTN, heart disease Social History:  reports that she has never smoked. She does not have any smokeless tobacco history on file. She reports that she does not drink alcohol or use illicit drugs. Medications: Citalopram, Nabumetone Allergies:  Allergies  Allergen Reactions  . Sulfa Antibiotics Hives    Review of Systems  Constitutional: Negative.   HENT: Positive for hearing loss and tinnitus. Negative for congestion, ear discharge, ear pain, nosebleeds and sore throat.   Eyes: Negative.   Respiratory: Negative.  Negative for stridor.    Cardiovascular: Negative.   Gastrointestinal: Negative.   Genitourinary: Negative.   Musculoskeletal: Positive for joint pain.  Skin: Negative.   Neurological: Negative.  Negative for headaches.  Endo/Heme/Allergies: Negative.   Psychiatric/Behavioral: Positive for depression.    There were no vitals taken for this visit. Physical Exam  Constitutional: She is oriented to person, place, and time. She appears well-developed and well-nourished. No distress.  HENT:  Head: Normocephalic and atraumatic.  Nose: Nose normal.  Eyes: Conjunctivae and EOM are normal. Pupils are equal, round, and reactive to light.  Neck: Normal range of motion. Neck supple.  Cardiovascular: Normal rate and intact distal pulses.   Respiratory: Effort normal. No respiratory distress.  GI: Soft. She exhibits no distension.  Musculoskeletal:       Left shoulder: She exhibits decreased range of motion, tenderness, bony tenderness, pain and decreased strength.  Neurological: She is alert and oriented to person, place, and time. No cranial nerve deficit.  Skin: Skin is warm and dry. No rash noted. No erythema.  Psychiatric: She has a normal mood and affect. Her behavior is normal.     Assessment/Plan left shoulder impingement with underlying OA and interstitial cuff tears  We discussed risks and benefits of left shoulder arthroscopy debridement, acromioplasty, distal clavicle excision, possible rotator cuff repair and patient wishes to proceed.  This will be done at [facility] outpatient surgical center.    Kimon Loewen 07/26/2013, 10:24 AM    

## 2013-07-26 NOTE — Anesthesia Preprocedure Evaluation (Addendum)
Anesthesia Evaluation  Patient identified by MRN, date of birth, ID band Patient awake    Reviewed: Allergy & Precautions, H&P , NPO status , Patient's Chart, lab work & pertinent test results  Airway Mallampati: II TM Distance: >3 FB Neck ROM: full    Dental  (+) Teeth Intact and Dental Advidsory Given   Pulmonary neg pulmonary ROS,  breath sounds clear to auscultation        Cardiovascular negative cardio ROS  Rhythm:regular Rate:Normal     Neuro/Psych PSYCHIATRIC DISORDERS negative neurological ROS     GI/Hepatic negative GI ROS, Neg liver ROS,   Endo/Other  negative endocrine ROS  Renal/GU negative Renal ROS     Musculoskeletal   Abdominal   Peds  Hematology   Anesthesia Other Findings   Reproductive/Obstetrics negative OB ROS                           Anesthesia Physical Anesthesia Plan  ASA: II  Anesthesia Plan: General ETT   Post-op Pain Management: MAC Combined w/ Regional for Post-op pain   Induction:   Airway Management Planned:   Additional Equipment:   Intra-op Plan:   Post-operative Plan:   Informed Consent: I have reviewed the patients History and Physical, chart, labs and discussed the procedure including the risks, benefits and alternatives for the proposed anesthesia with the patient or authorized representative who has indicated his/her understanding and acceptance.   Dental Advisory Given  Plan Discussed with: Anesthesiologist, CRNA and Surgeon  Anesthesia Plan Comments:         Anesthesia Quick Evaluation

## 2013-07-26 NOTE — Progress Notes (Signed)
Assisted Dr. Singer with left, ultrasound guided, interscalene  block. Side rails up, monitors on throughout procedure. See vital signs in flow sheet. Tolerated Procedure well.  

## 2013-07-26 NOTE — Anesthesia Postprocedure Evaluation (Signed)
  Anesthesia Post-op Note  Patient: [name]  Procedure(s) Performed: Procedure(s): LEFT SHOULDER ARTHROSCOPY WITH /SUBACROMIAL DECOMPRESSION AND DISTAL CLAVICLE RESECTION (Left)  Patient Location: PACU  Anesthesia Type:GA combined with regional for post-op pain  Level of Consciousness: awake, alert  and oriented  Airway and Oxygen Therapy: Patient Spontanous Breathing and Patient connected to face mask oxygen  Post-op Pain: none  Post-op Assessment: Post-op Vital signs reviewed  Post-op Vital Signs: Reviewed  Complications: No apparent anesthesia complications

## 2013-07-26 NOTE — Anesthesia Procedure Notes (Addendum)
Anesthesia Regional Block:  Interscalene brachial plexus block  Pre-Anesthetic Checklist: ,, timeout performed, Correct Patient, Correct Site, Correct Laterality, Correct Procedure, Correct Position, site marked, Risks and benefits discussed,  Surgical consent,  Pre-op evaluation,  At surgeon's request and post-op pain management  Laterality: Left  Prep: chloraprep       Needles:  Injection technique: Single-shot  Needle Type: Echogenic Stimulator Needle     Needle Length: 5cm 5 cm Needle Gauge: 22 and 22 G    Additional Needles:  Procedures: ultrasound guided (picture in chart) and nerve stimulator Interscalene brachial plexus block  Nerve Stimulator or Paresthesia:  Response: bicep contraction, 0.45 mA,   Additional Responses:   Narrative:  Start time: 07/26/2013 12:00 PM End time: 07/26/2013 12:08 PM Injection made incrementally with aspirations every 5 mL.  Performed by: Personally  Anesthesiologist: J. Tamela Gammon, MD  Additional Notes: Functioning IV was confirmed and monitors applied.  A 39mm 22ga echogenic arrow stimulator was used. Sterile prep and drape,hand hygiene and sterile gloves were used.Ultrasound guidance: relevant anatomy identified, needle position confirmed, local anesthetic spread visualized around nerve(s)., vascular puncture avoided.  Image printed for medical record.  Negative aspiration and negative test dose prior to incremental administration of local anesthetic. The patient tolerated the procedure well.   Procedure Name: Intubation Performed by: Terrance Mass Pre-anesthesia Checklist: Patient identified, Timeout performed, Emergency Drugs available, Suction available and Patient being monitored Patient Re-evaluated:Patient Re-evaluated prior to inductionOxygen Delivery Method: Circle system utilized Preoxygenation: Pre-oxygenation with 100% oxygen Intubation Type: IV induction Ventilation: Mask ventilation without difficulty Laryngoscope  Size: Miller and 2 Tube type: Oral Tube size: 7.0 mm Number of attempts: 1 Airway Equipment and Method: Stylet Placement Confirmation: ETT inserted through vocal cords under direct vision,  breath sounds checked- equal and bilateral and positive ETCO2 Secured at: 22 cm Tube secured with: Tape Dental Injury: Teeth and Oropharynx as per pre-operative assessment

## 2013-07-26 NOTE — Discharge Instructions (Signed)
Diet: As you were doing prior to hospitalization  Activity:  Increase activity slowly as tolerated                  No lifting or driving for 2 weeks  Shower:  May shower without a dressing in 48 hours, NO SOAKING in tub  Dressing:  You may change your dressing Wed, then cover with bandaids following shower until f/u.  Weight Bearing:  No lifting with surgical arm, may remove to do pendulum exercises and shower.  To prevent constipation: you may use a stool softener such as -               Colace ( over the counter) 100 mg by mouth twice a day                Drink plenty of fluids ( prune juice may be helpful) and high fiber foods                Miralax ( over the counter) for constipation as needed.    Precautions:  If you experience chest pain or shortness of breath - call 911 immediately               For transfer to the hospital emergency department!!               If you develop a fever greater that 101 F, purulent drainage from wound,                             increased redness or drainage from wound, or calf pain -- Call the office   Follow- Up Appointment:  Please call for an appointment to be seen in one week or as scheduled               [facility] - [phone]   Regional Anesthesia Blocks  1. Numbness or the inability to move the "blocked" extremity may last from 3-48 hours after placement. The length of time depends on the medication injected and your individual response to the medication. If the numbness is not going away after 48 hours, call your surgeon.  2. The extremity that is blocked will need to be protected until the numbness is gone and the  Strength has returned. Because you cannot feel it, you will need to take extra care to avoid injury. Because it may be weak, you may have difficulty moving it or using it. You may not know what position it is in without looking at it while the block is in effect.  3. For blocks in the legs and feet, returning to weight  bearing and walking needs to be done carefully. You will need to wait until the numbness is entirely gone and the strength has returned. You should be able to move your leg and foot normally before you try and bear weight or walk. You will need someone to be with you when you first try to ensure you do not fall and possibly risk injury.  4. Bruising and tenderness at the needle site are common side effects and will resolve in a few days.  5. Persistent numbness or new problems with movement should be communicated to the surgeon or the [facility] [phone] [place] [phone]).  Post Anesthesia Home Care Instructions  Activity: Get plenty of rest for the remainder of the day. A responsible adult should stay with you for 24  hours following the procedure.  For the next 24 hours, DO NOT: -Drive a car -Paediatric nurse -Drink alcoholic beverages -Take any medication unless instructed by your physician -Make any legal decisions or sign important papers.  Meals: Start with liquid foods such as gelatin or soup. Progress to regular foods as tolerated. Avoid greasy, spicy, heavy foods. If nausea and/or vomiting occur, drink only clear liquids until the nausea and/or vomiting subsides. Call your physician if vomiting continues.  Special Instructions/Symptoms: Your throat may feel dry or sore from the anesthesia or the breathing tube placed in your throat during surgery. If this causes discomfort, gargle with warm salt water. The discomfort should disappear within 24 hours.

## 2013-07-27 NOTE — Op Note (Signed)
NAME:  KATELY, GRAFFAM NO.:  [id number]  MEDICAL RECORD NO.:  [id number]  LOCATION:                                 FACILITY:  PHYSICIAN:  Lockie Pares, M.D.    DATE OF BIRTH:  August 19, 1945  DATE OF PROCEDURE:  07/26/2013 DATE OF DISCHARGE:  07/26/2013                              OPERATIVE REPORT   INDICATIONS:  A 68 year old with intractable left shoulder pain.  MRI showing cup pathology, significant osteoarthritis of the shoulder thought to be amenable to outpatient surgery.  PREOPERATIVE DIAGNOSES: 1. Advanced grade 3 osteoarthritis glenohumeral joint. 2. Tearing of labrum. 3. Impingement. 4. Acromioclavicular joint arthritis.  POSTOPERATIVE DIAGNOSES: 1. Advanced grade 3 osteoarthritis glenohumeral joint. 2. Tearing of labrum. 3. Impingement. 4. Acromioclavicular joint arthritis.  OPERATION: 1. Arthroscopic debridement (extensive). 2. Arthroscopic acromioplasty. 3. Arthroscopic excision of distal clavicle for the left shoulder.  SURGEON:  Lockie Pares, M.D.  ANESTHESIA:  General with nerve block.  DESCRIPTION OF PROCEDURE:  She was arthroscoped through a posterior, anterior, and lateral portals.  Intra-articularly, there was extensive glenohumeral degenerative change with advanced grade 3 changes over the entire glenoid and the majority of the probably 60% to 70% of the articular surface of the humerus.  There were actually loose pieces of articular cartilage in the joint, which were debrided.  Again no frank grade 4 changes, but advanced grade 3 changes.  Significant synovitis and tearing of the anterior, posterior, and inferior labrum was noted, which were debrided.  Intraarticular debridement was carried out extensively.  Subacromial space was inflamed with moderate impingement.  We performed a CA ligament release acromioplasty bursectomy and excised 1 cm to 1.5 cm of the arthritic distal clavicle.  Complete bursectomy carried  out. Superior surface of the cuff showed an abrasion type phenomenon.  There was no full-thickness component of the cuff tear. Shoulder drained free of fluid, porous-coated nylon and we infiltrated the joint with 80 mg of Depo-Medrol 45 mL 0.5% Marcaine.  Lightly compressive sterile dressing and sling applied, taken to the recovery room in a stable condition.     Lockie Pares, M.D.     WDC/MEDQ  D:  07/26/2013  T:  07/27/2013  Job:  [id number]

## 2013-07-28 ENCOUNTER — Encounter (HOSPITAL_BASED_OUTPATIENT_CLINIC_OR_DEPARTMENT_OTHER): Payer: Self-pay | Admitting: Orthopedic Surgery

## 2013-10-20 ENCOUNTER — Other Ambulatory Visit (HOSPITAL_COMMUNITY): Payer: Medicare Other

## 2013-12-24 ENCOUNTER — Encounter (HOSPITAL_COMMUNITY): Payer: Self-pay | Admitting: Pharmacy Technician

## 2013-12-27 ENCOUNTER — Other Ambulatory Visit: Payer: Self-pay | Admitting: Physician Assistant

## 2013-12-27 NOTE — H&P (Signed)
TOTAL KNEE ADMISSION H&P  Patient is being admitted for right total knee arthroplasty.  Subjective:  Chief Complaint:right knee pain.  HPI: [name], 68 y.o. female, has a history of pain and functional disability in the right knee due to arthritis and has failed non-surgical conservative treatments for greater than 12 weeks to includeNSAID's and/or analgesics, flexibility and strengthening excercises, use of assistive devices and activity modification.  Onset of symptoms was gradual, starting 7 years ago with gradually worsening course since that time. The patient noted no past surgery on the right knee(s).  Patient currently rates pain in the right knee(s) at 8 out of 10 with activity. Patient has night pain, worsening of pain with activity and weight bearing, pain that interferes with activities of daily living, pain with passive range of motion, crepitus and joint swelling.  Patient has evidence of periarticular osteophytes and joint space narrowing by imaging studies. There is no active infection.  There are no active problems to display for this patient.  Past Medical History  Diagnosis Date  . Arthritis     lower back & knees  . Depression     mild- meds for about 4 years  . Wears glasses   . Wears hearing aid     right ear    Past Surgical History  Procedure Laterality Date  . Mass removed from left ring finger  2013  . Mass excision  twice: 08/28/2011; 2012    Procedure: EXCISION MASS;  Surgeon: Wynonia Sours, MD;  Location: [facility];  Service: Orthopedics;  Laterality: Left;  excision mass left ring finger  . Mass excision  02/11/2012    Procedure: EXCISION MASS;  Surgeon: Wynonia Sours, MD;  Location: [facility];  Service: Orthopedics;  Laterality: Left;  EXCISION MUCOID CYST, DEBRIDEMENT INTERPHALANGEAL JOINT LEFT THUMB  . Joint replacement  2009    left knee replacement  . Back surgery  2007    lumbar fusion w/ rods & screws- lower back   . Cyst excision  2005    lumbar cyst  . Shoulder arthroscopy with rotator cuff repair and subacromial decompression Left 07/26/2013    Procedure: LEFT SHOULDER ARTHROSCOPY WITH /SUBACROMIAL DECOMPRESSION AND DISTAL CLAVICLE RESECTION;  Surgeon: Yvette Rack., MD;  Location: [facility];  Service: Orthopedics;  Laterality: Left;     (Not in a hospital admission) Allergies  Allergen Reactions  . Sulfa Antibiotics Hives    History  Substance Use Topics  . Smoking status: Never Smoker   . Smokeless tobacco: Not on file  . Alcohol Use: No    No family history on file.   Review of Systems  Constitutional: Negative.   HENT: Positive for hearing loss and tinnitus. Negative for congestion, ear discharge, ear pain, nosebleeds and sore throat.   Eyes: Negative.   Respiratory: Negative.  Negative for stridor.   Cardiovascular: Negative.   Gastrointestinal: Negative.   Genitourinary: Negative.   Musculoskeletal: Positive for joint pain. Negative for falls.  Skin: Negative.   Neurological: Negative.  Negative for headaches.  Endo/Heme/Allergies: Negative.   Psychiatric/Behavioral: Negative.     Objective:  Physical Exam  Constitutional: She is oriented to person, place, and time. She appears well-developed and well-nourished. No distress.  HENT:  Head: Normocephalic and atraumatic.  Nose: Nose normal.  Eyes: Conjunctivae and EOM are normal. Pupils are equal, round, and reactive to light.  Neck: Normal range of motion. Neck supple.  Cardiovascular: Normal rate, regular  rhythm, normal heart sounds and intact distal pulses.   Respiratory: Effort normal and breath sounds normal. No respiratory distress. She has no wheezes. She exhibits no tenderness.  GI: Soft. Bowel sounds are normal. She exhibits no distension. There is no tenderness.  Musculoskeletal:       Right knee: She exhibits decreased range of motion, swelling and bony tenderness. Tenderness found.   Lymphadenopathy:    She has no cervical adenopathy.  Neurological: She is alert and oriented to person, place, and time. No cranial nerve deficit.  Skin: Skin is warm and dry. No rash noted. No erythema.  Psychiatric: She has a normal mood and affect. Her behavior is normal.    Vital signs in last 24 hours: @VSRANGES @  Labs:   Estimated body mass index is 20.49 kg/(m^2) as calculated from the following:   Height as of 07/26/13: 5\' 1"  (1.549 m).   Weight as of 07/26/13: 49.17 kg (108 lb 6.4 oz).   Imaging Review Plain radiographs demonstrate severe degenerative joint disease of the right knee(s). The overall alignment ismild varus. The bone quality appears to be good for age and reported activity level.  Assessment/Plan:  End stage arthritis, right knee   The patient history, physical examination, clinical judgment of the provider and imaging studies are consistent with end stage degenerative joint disease of the right knee(s) and total knee arthroplasty is deemed medically necessary. The treatment options including medical management, injection therapy arthroscopy and arthroplasty were discussed at length. The risks and benefits of total knee arthroplasty were presented and reviewed. The risks due to aseptic loosening, infection, stiffness, patella tracking problems, thromboembolic complications and other imponderables were discussed. The patient acknowledged the explanation, agreed to proceed with the plan and consent was signed. Patient is being admitted for inpatient treatment for surgery, pain control, PT, OT, prophylactic antibiotics, VTE prophylaxis, progressive ambulation and ADL's and discharge planning. The patient is planning to be discharged home with home health services

## 2013-12-27 NOTE — Pre-Procedure Instructions (Signed)
[name]  12/27/2013   Your procedure is scheduled on:  Fri, June 26 @ 7:30 AM  Report to [facility]  at 5:30 AM.  Call this number if you have problems the morning of surgery: [phone]   Remember:   Do not eat food or drink liquids after midnight.                Stop taking your Fish Oil,Vit E,Biotin,and Meloxicam.  No Goody's,BC's,Aleve,Ibuprofen,or any Herbal Medications   Do not wear jewelry, make-up or nail polish.  Do not wear lotions, powders, or perfumes. You may wear deodorant.  Do not shave 48 hours prior to surgery.   Do not bring valuables to the hospital.  [facility] is not responsible                  for any belongings or valuables.               Contacts, dentures or bridgework may not be worn into surgery.  Leave suitcase in the car. After surgery it may be brought to your room.  For patients admitted to the hospital, discharge time is determined by your                treatment team.                 Special Instructions:  [place] - Preparing for Surgery  Before surgery, you can play an important role.  Because skin is not sterile, your skin needs to be as free of germs as possible.  You can reduce the number of germs on you skin by washing with CHG (chlorahexidine gluconate) soap before surgery.  CHG is an antiseptic cleaner which kills germs and bonds with the skin to continue killing germs even after washing.  Please DO NOT use if you have an allergy to CHG or antibacterial soaps.  If your skin becomes reddened/irritated stop using the CHG and inform your nurse when you arrive at Short Stay.  Do not shave (including legs and underarms) for at least 48 hours prior to the first CHG shower.  You may shave your face.  Please follow these instructions carefully:   1.  Shower with CHG Soap the night before surgery and the                                morning of Surgery.  2.  If you choose to wash your hair, wash your hair first as usual with  your       normal shampoo.  3.  After you shampoo, rinse your hair and body thoroughly to remove the                      Shampoo.  4.  Use CHG as you would any other liquid soap.  You can apply chg directly       to the skin and wash gently with scrungie or a clean washcloth.  5.  Apply the CHG Soap to your body ONLY FROM THE NECK DOWN.        Do not use on open wounds or open sores.  Avoid contact with your eyes,       ears, mouth and genitals (private parts).  Wash genitals (private parts)       with your normal soap.  6.  Wash thoroughly, paying special attention to  the area where your surgery        will be performed.  7.  Thoroughly rinse your body with warm water from the neck down.  8.  DO NOT shower/wash with your normal soap after using and rinsing off       the CHG Soap.  9.  Pat yourself dry with a clean towel.            10.  Wear clean pajamas.            11.  Place clean sheets on your bed the night of your first shower and do not        sleep with pets.  Day of Surgery  Do not apply any lotions/deoderants the morning of surgery.  Please wear clean clothes to the hospital/surgery center.     Please read over the following fact sheets that you were given: Pain Booklet, Coughing and Deep Breathing, Blood Transfusion Information, MRSA Information and Surgical Site Infection Prevention

## 2013-12-28 ENCOUNTER — Encounter (HOSPITAL_COMMUNITY): Payer: Self-pay

## 2013-12-28 ENCOUNTER — Ambulatory Visit (HOSPITAL_COMMUNITY)
Admission: RE | Admit: 2013-12-28 | Discharge: 2013-12-28 | Disposition: A | Discharge status: Home / Self Care | Payer: Medicare Other | Source: Ambulatory Visit | Attending: Physician Assistant | Admitting: Physician Assistant

## 2013-12-28 ENCOUNTER — Encounter (HOSPITAL_COMMUNITY)
Admission: RE | Admit: 2013-12-28 | Discharge: 2013-12-28 | Disposition: A | Discharge status: Home / Self Care | Payer: Medicare Other | Source: Ambulatory Visit | Attending: Orthopedic Surgery | Admitting: Orthopedic Surgery

## 2013-12-28 DIAGNOSIS — Z01818 Encounter for other preprocedural examination: Secondary | ICD-10-CM | POA: Insufficient documentation

## 2013-12-28 DIAGNOSIS — Z01812 Encounter for preprocedural laboratory examination: Secondary | ICD-10-CM | POA: Insufficient documentation

## 2013-12-28 DIAGNOSIS — M1710 Unilateral primary osteoarthritis, unspecified knee: Secondary | ICD-10-CM | POA: Insufficient documentation

## 2013-12-28 HISTORY — DX: Pain in unspecified joint: M25.50

## 2013-12-28 HISTORY — DX: Unspecified cataract: H26.9

## 2013-12-28 HISTORY — DX: Effusion, unspecified joint: M25.40

## 2013-12-28 HISTORY — DX: Dorsalgia, unspecified: M54.9

## 2013-12-28 HISTORY — DX: Nocturia: R35.1

## 2013-12-28 HISTORY — DX: Insomnia, unspecified: G47.00

## 2013-12-28 HISTORY — DX: Headache: R51

## 2013-12-28 HISTORY — DX: Unspecified osteoarthritis, unspecified site: M19.90

## 2013-12-28 HISTORY — DX: Personal history of other medical treatment: Z92.89

## 2013-12-28 LAB — COMPREHENSIVE METABOLIC PANEL
ALK PHOS: 78 U/L (ref 39–117)
ALT: 16 U/L (ref 0–35)
AST: 20 U/L (ref 0–37)
Albumin: 3.8 g/dL (ref 3.5–5.2)
BILIRUBIN TOTAL: 0.4 mg/dL (ref 0.3–1.2)
BUN: 20 mg/dL (ref 6–23)
CHLORIDE: 103 meq/L (ref 96–112)
CO2: 28 mEq/L (ref 19–32)
Calcium: 9.2 mg/dL (ref 8.4–10.5)
Creatinine, Ser: 0.62 mg/dL (ref 0.50–1.10)
GFR calc Af Amer: >90 mL/min (ref >90)
GFR calc non Af Amer: 90 mL/min — ABNORMAL LOW (ref >90)
Glucose, Bld: 71 mg/dL (ref 70–99)
Potassium: 4 mEq/L (ref 3.7–5.3)
SODIUM: 141 meq/L (ref 137–147)
TOTAL PROTEIN: 7.3 g/dL (ref 6.0–8.3)

## 2013-12-28 LAB — URINALYSIS, ROUTINE W REFLEX MICROSCOPIC
Bilirubin Urine: NEGATIVE
Glucose, UA: NEGATIVE mg/dL
Ketones, ur: NEGATIVE mg/dL
NITRITE: NEGATIVE
PROTEIN: NEGATIVE mg/dL
Specific Gravity, Urine: 1.016 (ref 1.005–1.030)
UROBILINOGEN UA: 0.2 mg/dL (ref 0.0–1.0)
pH: 6 (ref 5.0–8.0)

## 2013-12-28 LAB — CBC WITH DIFFERENTIAL/PLATELET
BASOS ABS: 0.1 10*3/uL (ref 0.0–0.1)
Basophils Relative: 1 % (ref 0–1)
EOS PCT: 3 % (ref 0–5)
Eosinophils Absolute: 0.2 10*3/uL (ref 0.0–0.7)
HCT: 37.5 % (ref 36.0–46.0)
HEMOGLOBIN: 12.5 g/dL (ref 12.0–15.0)
Lymphocytes Relative: 37 % (ref 12–46)
Lymphs Abs: 2.8 10*3/uL (ref 0.7–4.0)
MCH: 30.3 pg (ref 26.0–34.0)
MCHC: 33.3 g/dL (ref 30.0–36.0)
MCV: 90.8 fL (ref 78.0–100.0)
MONO ABS: 0.6 10*3/uL (ref 0.1–1.0)
Monocytes Relative: 8 % (ref 3–12)
Neutro Abs: 3.9 10*3/uL (ref 1.7–7.7)
Neutrophils Relative %: 51 % (ref 43–77)
Platelets: 315 10*3/uL (ref 150–400)
RBC: 4.13 MIL/uL (ref 3.87–5.11)
RDW: 12.8 % (ref 11.5–15.5)
WBC: 7.5 10*3/uL (ref 4.0–10.5)

## 2013-12-28 LAB — URINE MICROSCOPIC-ADD ON

## 2013-12-28 LAB — PROTIME-INR
INR: 1.03 (ref 0.00–1.49)
PROTHROMBIN TIME: 13.3 s (ref 11.6–15.2)

## 2013-12-28 LAB — SURGICAL PCR SCREEN
MRSA, PCR: NEGATIVE
Staphylococcus aureus: NEGATIVE

## 2013-12-28 LAB — APTT: aPTT: 33 seconds (ref 24–37)

## 2013-12-28 MED ORDER — CHLORHEXIDINE GLUCONATE 4 % EX LIQD: 60.0000 mL | Freq: Once | CUTANEOUS | Status: DC

## 2013-12-28 NOTE — Progress Notes (Signed)
Pt doesn't have a cardiologist  Echo in epic from 2007  Denies ever having a stress test/heart cath  Medical Md is Dr.William Kenton Kingfisher

## 2013-12-29 LAB — URINE CULTURE

## 2014-01-06 MED ORDER — CEFAZOLIN SODIUM-DEXTROSE 2-3 GM-% IV SOLR
2.0000 g | INTRAVENOUS | Status: AC
  Administered 2014-01-07: 2 g via INTRAVENOUS
  Filled 2014-01-06: qty 50

## 2014-01-06 MED ORDER — ACETAMINOPHEN 500 MG PO TABS
1000.0000 mg | ORAL_TABLET | Freq: Once | ORAL | Status: AC
  Administered 2014-01-07: 1000 mg via ORAL
  Filled 2014-01-06: qty 2

## 2014-01-06 MED ORDER — SODIUM CHLORIDE 0.9 % IV SOLN: INTRAVENOUS | Status: DC

## 2014-01-06 MED ORDER — TRANEXAMIC ACID 100 MG/ML IV SOLN
1000.0000 mg | INTRAVENOUS | Status: AC
  Administered 2014-01-07: 1000 mg via INTRAVENOUS
  Filled 2014-01-06: qty 10

## 2014-01-07 ENCOUNTER — Encounter (HOSPITAL_COMMUNITY): Payer: Medicare Other | Admitting: Anesthesiology

## 2014-01-07 ENCOUNTER — Inpatient Hospital Stay (HOSPITAL_COMMUNITY): Payer: Medicare Other | Admitting: Anesthesiology

## 2014-01-07 ENCOUNTER — Inpatient Hospital Stay (HOSPITAL_COMMUNITY)
Admission: RE | Admit: 2014-01-07 | Discharge: 2014-01-10 | DRG: 470 | Disposition: A | Discharge status: Home Health | Payer: Medicare Other | Source: Ambulatory Visit | Attending: Orthopedic Surgery | Admitting: Orthopedic Surgery

## 2014-01-07 ENCOUNTER — Encounter (HOSPITAL_COMMUNITY): Payer: Self-pay | Admitting: Surgery

## 2014-01-07 ENCOUNTER — Encounter (HOSPITAL_COMMUNITY)
Admission: RE | Disposition: A | Discharge status: Home Health | Payer: Self-pay | Source: Ambulatory Visit | Attending: Orthopedic Surgery

## 2014-01-07 DIAGNOSIS — M1711 Unilateral primary osteoarthritis, right knee: Secondary | ICD-10-CM | POA: Diagnosis present

## 2014-01-07 DIAGNOSIS — D62 Acute posthemorrhagic anemia: Secondary | ICD-10-CM

## 2014-01-07 DIAGNOSIS — F329 Major depressive disorder, single episode, unspecified: Secondary | ICD-10-CM | POA: Diagnosis present

## 2014-01-07 DIAGNOSIS — Z96659 Presence of unspecified artificial knee joint: Secondary | ICD-10-CM

## 2014-01-07 DIAGNOSIS — Z881 Allergy status to other antibiotic agents status: Secondary | ICD-10-CM

## 2014-01-07 DIAGNOSIS — F32A Depression, unspecified: Secondary | ICD-10-CM | POA: Diagnosis present

## 2014-01-07 DIAGNOSIS — M1710 Unilateral primary osteoarthritis, unspecified knee: Principal | ICD-10-CM | POA: Diagnosis present

## 2014-01-07 DIAGNOSIS — Z981 Arthrodesis status: Secondary | ICD-10-CM

## 2014-01-07 DIAGNOSIS — F3289 Other specified depressive episodes: Secondary | ICD-10-CM | POA: Diagnosis present

## 2014-01-07 DIAGNOSIS — H9190 Unspecified hearing loss, unspecified ear: Secondary | ICD-10-CM | POA: Diagnosis present

## 2014-01-07 HISTORY — PX: TOTAL KNEE ARTHROPLASTY: SHX125

## 2014-01-07 SURGERY — ARTHROPLASTY, KNEE, TOTAL
Anesthesia: General | Site: Knee | Laterality: Right

## 2014-01-07 MED ORDER — ACETAMINOPHEN 325 MG PO TABS: 650.0000 mg | ORAL_TABLET | Freq: Four times a day (QID) | ORAL | Status: DC | PRN

## 2014-01-07 MED ORDER — ARTIFICIAL TEARS OP OINT
TOPICAL_OINTMENT | OPHTHALMIC | Status: DC | PRN
  Administered 2014-01-07: 1 via OPHTHALMIC

## 2014-01-07 MED ORDER — OXYCODONE HCL 5 MG PO TABS
ORAL_TABLET | ORAL | Status: AC
  Administered 2014-01-07: 12:00:00
  Filled 2014-01-07: qty 2

## 2014-01-07 MED ORDER — LIDOCAINE HCL (CARDIAC) 20 MG/ML IV SOLN
INTRAVENOUS | Status: AC
  Filled 2014-01-07: qty 5

## 2014-01-07 MED ORDER — DEXAMETHASONE 6 MG PO TABS
10.0000 mg | ORAL_TABLET | Freq: Three times a day (TID) | ORAL | Status: AC
  Administered 2014-01-07 – 2014-01-08 (×2): 10 mg via ORAL
  Filled 2014-01-07 (×3): qty 1

## 2014-01-07 MED ORDER — CEFAZOLIN SODIUM 1-5 GM-% IV SOLN
1.0000 g | Freq: Four times a day (QID) | INTRAVENOUS | Status: AC
  Administered 2014-01-07 (×2): 1 g via INTRAVENOUS
  Filled 2014-01-07 (×2): qty 50

## 2014-01-07 MED ORDER — MENTHOL 3 MG MT LOZG: 1.0000 | LOZENGE | OROMUCOSAL | Status: DC | PRN

## 2014-01-07 MED ORDER — HYDROMORPHONE HCL PF 1 MG/ML IJ SOLN
INTRAMUSCULAR | Status: AC
  Administered 2014-01-07: 11:00:00
  Filled 2014-01-07: qty 1

## 2014-01-07 MED ORDER — ACETAMINOPHEN 650 MG RE SUPP: 650.0000 mg | Freq: Four times a day (QID) | RECTAL | Status: DC | PRN

## 2014-01-07 MED ORDER — 0.9 % SODIUM CHLORIDE (POUR BTL) OPTIME
TOPICAL | Status: DC | PRN
  Administered 2014-01-07: 1000 mL

## 2014-01-07 MED ORDER — FLEET ENEMA 7-19 GM/118ML RE ENEM: 1.0000 | ENEMA | Freq: Once | RECTAL | Status: AC | PRN

## 2014-01-07 MED ORDER — VITAMIN E 45 MG (100 UNIT) PO CAPS
100.0000 [IU] | ORAL_CAPSULE | Freq: Every day | ORAL | Status: DC
  Administered 2014-01-08 – 2014-01-10 (×3): 100 [IU] via ORAL
  Filled 2014-01-07 (×4): qty 1

## 2014-01-07 MED ORDER — EPHEDRINE SULFATE 50 MG/ML IJ SOLN
INTRAMUSCULAR | Status: DC | PRN
  Administered 2014-01-07 (×2): 10 mg via INTRAVENOUS

## 2014-01-07 MED ORDER — HYDROMORPHONE HCL PF 1 MG/ML IJ SOLN
0.5000 mg | INTRAMUSCULAR | Status: DC | PRN
Start: 2014-01-07 — End: 2014-01-07

## 2014-01-07 MED ORDER — HYDROMORPHONE HCL PF 1 MG/ML IJ SOLN
INTRAMUSCULAR | Status: AC
  Administered 2014-01-07: 12:00:00
  Filled 2014-01-07: qty 1

## 2014-01-07 MED ORDER — BISACODYL 5 MG PO TBEC: 5.0000 mg | DELAYED_RELEASE_TABLET | Freq: Every day | ORAL | Status: DC | PRN

## 2014-01-07 MED ORDER — MIDAZOLAM HCL 2 MG/2ML IJ SOLN
INTRAMUSCULAR | Status: AC
  Filled 2014-01-07: qty 2

## 2014-01-07 MED ORDER — DEXAMETHASONE SODIUM PHOSPHATE 10 MG/ML IJ SOLN
INTRAMUSCULAR | Status: AC
  Filled 2014-01-07: qty 1

## 2014-01-07 MED ORDER — ONDANSETRON HCL 4 MG/2ML IJ SOLN
INTRAMUSCULAR | Status: AC
  Filled 2014-01-07: qty 2

## 2014-01-07 MED ORDER — HYDROCODONE-ACETAMINOPHEN 5-325 MG PO TABS
1.0000 | ORAL_TABLET | ORAL | Status: DC | PRN
  Administered 2014-01-07: 1 via ORAL
  Administered 2014-01-07: 2 via ORAL
  Administered 2014-01-07: 1 via ORAL
  Administered 2014-01-08 (×2): 2 via ORAL
  Filled 2014-01-07 (×2): qty 1
  Filled 2014-01-07 (×3): qty 2

## 2014-01-07 MED ORDER — HYDROMORPHONE HCL PF 1 MG/ML IJ SOLN
0.2500 mg | INTRAMUSCULAR | Status: DC | PRN
Start: 2014-01-07 — End: 2014-01-07
  Administered 2014-01-07 (×3): 0.5 mg via INTRAVENOUS

## 2014-01-07 MED ORDER — ONDANSETRON HCL 4 MG/2ML IJ SOLN: 4.0000 mg | Freq: Once | INTRAMUSCULAR | Status: DC | PRN

## 2014-01-07 MED ORDER — LIDOCAINE HCL (CARDIAC) 20 MG/ML IV SOLN
INTRAVENOUS | Status: DC | PRN
  Administered 2014-01-07: 100 mg via INTRAVENOUS

## 2014-01-07 MED ORDER — ONDANSETRON HCL 4 MG PO TABS: 4.0000 mg | ORAL_TABLET | Freq: Four times a day (QID) | ORAL | Status: DC | PRN

## 2014-01-07 MED ORDER — DOCUSATE SODIUM 100 MG PO CAPS
100.0000 mg | ORAL_CAPSULE | Freq: Two times a day (BID) | ORAL | Status: DC
  Administered 2014-01-07 – 2014-01-10 (×6): 100 mg via ORAL
  Filled 2014-01-07 (×6): qty 1

## 2014-01-07 MED ORDER — DEXAMETHASONE SODIUM PHOSPHATE 4 MG/ML IJ SOLN
INTRAMUSCULAR | Status: DC | PRN
  Administered 2014-01-07: 8 mg via INTRAVENOUS

## 2014-01-07 MED ORDER — SENNOSIDES-DOCUSATE SODIUM 8.6-50 MG PO TABS: 1.0000 | ORAL_TABLET | Freq: Every evening | ORAL | Status: DC | PRN

## 2014-01-07 MED ORDER — PROPOFOL 10 MG/ML IV BOLUS
INTRAVENOUS | Status: AC
  Filled 2014-01-07: qty 20

## 2014-01-07 MED ORDER — FENTANYL CITRATE 0.05 MG/ML IJ SOLN
INTRAMUSCULAR | Status: AC
  Filled 2014-01-07: qty 5

## 2014-01-07 MED ORDER — DEXTROSE 5 % IV SOLN
INTRAVENOUS | Status: DC | PRN
Start: 2014-01-07 — End: 2014-01-07
  Administered 2014-01-07: 07:00:00 via INTRAVENOUS

## 2014-01-07 MED ORDER — ONDANSETRON HCL 4 MG/2ML IJ SOLN: 4.0000 mg | Freq: Four times a day (QID) | INTRAMUSCULAR | Status: DC | PRN

## 2014-01-07 MED ORDER — OXYCODONE HCL 5 MG PO TABS
5.0000 mg | ORAL_TABLET | ORAL | Status: DC | PRN
  Administered 2014-01-07: 10 mg via ORAL

## 2014-01-07 MED ORDER — FENTANYL CITRATE 0.05 MG/ML IJ SOLN
INTRAMUSCULAR | Status: DC | PRN
  Administered 2014-01-07 (×4): 50 ug via INTRAVENOUS
  Administered 2014-01-07: 100 ug via INTRAVENOUS
  Administered 2014-01-07: 150 ug via INTRAVENOUS
  Administered 2014-01-07: 50 ug via INTRAVENOUS

## 2014-01-07 MED ORDER — OXYCODONE-ACETAMINOPHEN 5-325 MG PO TABS: 1.0000 | ORAL_TABLET | ORAL | Status: DC | PRN

## 2014-01-07 MED ORDER — ONDANSETRON HCL 4 MG/2ML IJ SOLN
INTRAMUSCULAR | Status: DC | PRN
  Administered 2014-01-07: 4 mg via INTRAVENOUS

## 2014-01-07 MED ORDER — LACTATED RINGERS IV SOLN
INTRAVENOUS | Status: DC | PRN
  Administered 2014-01-07 (×2): via INTRAVENOUS

## 2014-01-07 MED ORDER — DEXAMETHASONE SODIUM PHOSPHATE 10 MG/ML IJ SOLN
10.0000 mg | Freq: Three times a day (TID) | INTRAMUSCULAR | Status: AC
  Administered 2014-01-07: 10 mg via INTRAVENOUS
  Filled 2014-01-07 (×4): qty 1

## 2014-01-07 MED ORDER — METOCLOPRAMIDE HCL 5 MG/ML IJ SOLN: 5.0000 mg | Freq: Three times a day (TID) | INTRAMUSCULAR | Status: DC | PRN

## 2014-01-07 MED ORDER — RIVAROXABAN 10 MG PO TABS
10.0000 mg | ORAL_TABLET | Freq: Every day | ORAL | Status: DC
  Administered 2014-01-08 – 2014-01-10 (×3): 10 mg via ORAL
  Filled 2014-01-07 (×4): qty 1

## 2014-01-07 MED ORDER — METOCLOPRAMIDE HCL 10 MG PO TABS: 5.0000 mg | ORAL_TABLET | Freq: Three times a day (TID) | ORAL | Status: DC | PRN

## 2014-01-07 MED ORDER — BUPIVACAINE-EPINEPHRINE (PF) 0.5% -1:200000 IJ SOLN
INTRAMUSCULAR | Status: AC
  Filled 2014-01-07: qty 30

## 2014-01-07 MED ORDER — PROPOFOL 10 MG/ML IV BOLUS
INTRAVENOUS | Status: DC | PRN
  Administered 2014-01-07: 280 mg via INTRAVENOUS

## 2014-01-07 MED ORDER — PHENOL 1.4 % MT LIQD: 1.0000 | OROMUCOSAL | Status: DC | PRN

## 2014-01-07 MED ORDER — RIVAROXABAN 10 MG PO TABS: 10.0000 mg | ORAL_TABLET | Freq: Every day | ORAL | Status: DC

## 2014-01-07 MED ORDER — CITALOPRAM HYDROBROMIDE 10 MG PO TABS
10.0000 mg | ORAL_TABLET | Freq: Every day | ORAL | Status: DC
  Administered 2014-01-08 – 2014-01-10 (×3): 10 mg via ORAL
  Filled 2014-01-07 (×3): qty 1

## 2014-01-07 MED ORDER — MIDAZOLAM HCL 5 MG/5ML IJ SOLN
INTRAMUSCULAR | Status: DC | PRN
  Administered 2014-01-07: 2 mg via INTRAVENOUS

## 2014-01-07 MED ORDER — SODIUM CHLORIDE 0.9 % IV SOLN
INTRAVENOUS | Status: DC
  Administered 2014-01-07: 1000 mL via INTRAVENOUS

## 2014-01-07 MED ORDER — NALOXONE HCL 0.4 MG/ML IJ SOLN
INTRAMUSCULAR | Status: AC
  Administered 2014-01-07: 0.4 mg
  Filled 2014-01-07: qty 1

## 2014-01-07 MED ORDER — SODIUM CHLORIDE 0.9 % IR SOLN
Status: DC | PRN
  Administered 2014-01-07: 1000 mL

## 2014-01-07 MED ORDER — VITAMIN D3 25 MCG (1000 UNIT) PO TABS
1000.0000 [IU] | ORAL_TABLET | Freq: Every day | ORAL | Status: DC
  Administered 2014-01-08 – 2014-01-10 (×3): 1000 [IU] via ORAL
  Filled 2014-01-07 (×4): qty 1

## 2014-01-07 SURGICAL SUPPLY — 74 items
APL SKNCLS STERI-STRIP NONHPOA (GAUZE/BANDAGES/DRESSINGS) ×1
BANDAGE ELASTIC 4 VELCRO ST LF (GAUZE/BANDAGES/DRESSINGS) ×3 IMPLANT
BANDAGE ELASTIC 6 VELCRO ST LF (GAUZE/BANDAGES/DRESSINGS) ×3 IMPLANT
BANDAGE ESMARK 6X9 LF (GAUZE/BANDAGES/DRESSINGS) ×1 IMPLANT
BENZOIN TINCTURE PRP APPL 2/3 (GAUZE/BANDAGES/DRESSINGS) ×2 IMPLANT
BLADE SAGITTAL 25.0X1.19X90 (BLADE) ×2 IMPLANT
BLADE SAGITTAL 25.0X1.19X90MM (BLADE) ×1
BLADE SAW SAG 90X13X1.27 (BLADE) ×3 IMPLANT
BNDG CMPR 9X6 STRL LF SNTH (GAUZE/BANDAGES/DRESSINGS) ×1
BNDG ESMARK 6X9 LF (GAUZE/BANDAGES/DRESSINGS) ×3
BOWL SMART MIX CTS (DISPOSABLE) ×3 IMPLANT
CAPT RP KNEE ×2 IMPLANT
CEMENT HV SMART SET (Cement) ×4 IMPLANT
CLOSURE STERI-STRIP 1/2X4 (GAUZE/BANDAGES/DRESSINGS) ×1
CLOSURE WOUND 1/2 X4 (GAUZE/BANDAGES/DRESSINGS) ×1
CLSR STERI-STRIP ANTIMIC 1/2X4 (GAUZE/BANDAGES/DRESSINGS) ×1 IMPLANT
COVER SURGICAL LIGHT HANDLE (MISCELLANEOUS) ×3 IMPLANT
CUFF TOURNIQUET SINGLE 34IN LL (TOURNIQUET CUFF) ×3 IMPLANT
DRAPE INCISE IOBAN 66X45 STRL (DRAPES) ×2 IMPLANT
DRAPE ORTHO SPLIT 77X108 STRL (DRAPES) ×6
DRAPE SURG ORHT 6 SPLT 77X108 (DRAPES) ×2 IMPLANT
DRAPE U-SHAPE 47X51 STRL (DRAPES) ×3 IMPLANT
DRSG ADAPTIC 3X8 NADH LF (GAUZE/BANDAGES/DRESSINGS) ×3 IMPLANT
DRSG PAD ABDOMINAL 8X10 ST (GAUZE/BANDAGES/DRESSINGS) ×6 IMPLANT
DURAPREP 26ML APPLICATOR (WOUND CARE) ×3 IMPLANT
ELECT REM PT RETURN 9FT ADLT (ELECTROSURGICAL) ×3
ELECTRODE REM PT RTRN 9FT ADLT (ELECTROSURGICAL) ×1 IMPLANT
EVACUATOR 1/8 PVC DRAIN (DRAIN) ×3 IMPLANT
FACESHIELD WRAPAROUND (MASK) ×6 IMPLANT
FACESHIELD WRAPAROUND OR TEAM (MASK) ×2 IMPLANT
GLOVE BIO SURGEON STRL SZ 6.5 (GLOVE) ×1 IMPLANT
GLOVE BIO SURGEONS STRL SZ 6.5 (GLOVE) ×1
GLOVE BIOGEL PI IND STRL 7.0 (GLOVE) IMPLANT
GLOVE BIOGEL PI IND STRL 8 (GLOVE) ×4 IMPLANT
GLOVE BIOGEL PI INDICATOR 7.0 (GLOVE) ×2
GLOVE BIOGEL PI INDICATOR 8 (GLOVE) ×8
GLOVE ORTHO TXT STRL SZ7.5 (GLOVE) ×9 IMPLANT
GLOVE SURG ORTHO 8.0 STRL STRW (GLOVE) ×9 IMPLANT
GOWN STRL REUS W/ TWL LRG LVL3 (GOWN DISPOSABLE) ×2 IMPLANT
GOWN STRL REUS W/ TWL XL LVL3 (GOWN DISPOSABLE) ×1 IMPLANT
GOWN STRL REUS W/TWL 2XL LVL3 (GOWN DISPOSABLE) ×3 IMPLANT
GOWN STRL REUS W/TWL LRG LVL3 (GOWN DISPOSABLE) ×6
GOWN STRL REUS W/TWL XL LVL3 (GOWN DISPOSABLE) ×3
HANDPIECE INTERPULSE COAX TIP (DISPOSABLE) ×3
HOOD PEEL AWAY FACE SHEILD DIS (HOOD) ×3 IMPLANT
IMMOBILIZER KNEE 22 UNIV (SOFTGOODS) ×2 IMPLANT
KIT BASIN OR (CUSTOM PROCEDURE TRAY) ×3 IMPLANT
KIT ROOM TURNOVER OR (KITS) ×3 IMPLANT
MANIFOLD NEPTUNE II (INSTRUMENTS) ×3 IMPLANT
NS IRRIG 1000ML POUR BTL (IV SOLUTION) ×3 IMPLANT
PACK TOTAL JOINT (CUSTOM PROCEDURE TRAY) ×3 IMPLANT
PAD ABD 8X10 STRL (GAUZE/BANDAGES/DRESSINGS) ×2 IMPLANT
PAD ARMBOARD 7.5X6 YLW CONV (MISCELLANEOUS) ×6 IMPLANT
PAD CAST 4YDX4 CTTN HI CHSV (CAST SUPPLIES) ×1 IMPLANT
PADDING CAST ABS 4INX4YD NS (CAST SUPPLIES) ×2
PADDING CAST ABS COTTON 4X4 ST (CAST SUPPLIES) IMPLANT
PADDING CAST COTTON 4X4 STRL (CAST SUPPLIES) ×3
PADDING CAST COTTON 6X4 STRL (CAST SUPPLIES) ×3 IMPLANT
SET HNDPC FAN SPRY TIP SCT (DISPOSABLE) ×1 IMPLANT
SPONGE GAUZE 4X4 12PLY (GAUZE/BANDAGES/DRESSINGS) ×3 IMPLANT
STAPLER VISISTAT 35W (STAPLE) ×3 IMPLANT
STRIP CLOSURE SKIN 1/2X4 (GAUZE/BANDAGES/DRESSINGS) ×1 IMPLANT
SUCTION FRAZIER TIP 10 FR DISP (SUCTIONS) ×3 IMPLANT
SUT ETHIBOND NAB CT1 #1 30IN (SUTURE) ×11 IMPLANT
SUT MNCRL AB 3-0 PS2 18 (SUTURE) ×2 IMPLANT
SUT VIC AB 0 CT1 27 (SUTURE) ×3
SUT VIC AB 0 CT1 27XBRD ANBCTR (SUTURE) ×1 IMPLANT
SUT VIC AB 1 CT1 27 (SUTURE) ×6
SUT VIC AB 1 CT1 27XBRD ANBCTR (SUTURE) ×2 IMPLANT
SUT VIC AB 2-0 CT1 27 (SUTURE) ×9
SUT VIC AB 2-0 CT1 TAPERPNT 27 (SUTURE) ×2 IMPLANT
TOWEL OR 17X24 6PK STRL BLUE (TOWEL DISPOSABLE) ×3 IMPLANT
TOWEL OR 17X26 10 PK STRL BLUE (TOWEL DISPOSABLE) ×3 IMPLANT
WATER STERILE IRR 1000ML POUR (IV SOLUTION) ×4 IMPLANT

## 2014-01-07 NOTE — Progress Notes (Signed)
Read, reviewed, edited and agree with student's findings and recommendations.  Rebecca B. Medendorp, PT, DPT #[phone]  

## 2014-01-07 NOTE — Progress Notes (Signed)
Orthopedic Tech Progress Note Patient Details:  [name] Feb 07, 1946 [id number] CPM applied to RLE with appropriate settings. OHF applied to bed. CPM Right Knee CPM Right Knee: On Right Knee Flexion (Degrees): 60 Right Knee Extension (Degrees): 0 Additional Comments:  (initiated per ortho tech)   [name] 01/07/2014, 11:35 AM

## 2014-01-07 NOTE — Progress Notes (Signed)
Utilization review completed.  

## 2014-01-07 NOTE — Progress Notes (Signed)
Physical Therapy Treatment Patient Details Name: [name] MRN: [id number] DOB: [birth year] Today's Date: 01/07/2014    History of Present Illness 68 y.o. pt s/p R TKA on 01/07/14. PMHx of L TKA, arthritis, lumbar fusion, and L rotator cuff repair.    PT Comments    Patient mobilizing well with use of walker. Patient has good family support at home with spouse supervision. Patient will benefit from HHPT. PT to follow acutely for deficits listed below.    Follow Up Recommendations  Home health PT     Equipment Recommendations  Rolling walker with 5" wheels       Precautions / Restrictions Precautions Precautions: Knee Required Braces or Orthoses: Knee Immobilizer - Right Knee Immobilizer - Right: On when out of bed or walking Restrictions Weight Bearing Restrictions: Yes RLE Weight Bearing: Weight bearing as tolerated    Mobility  Bed Mobility Overal bed mobility: Independent             General bed mobility comments: Patient moved quickly from supine to sit without assistance  Transfers Overall transfer level: Needs assistance Equipment used: Rolling walker (2 wheeled) Transfers: Sit to/from Stand Sit to Stand: Supervision            Ambulation/Gait Ambulation/Gait assistance: Min assist Ambulation Distance (Feet): 20 Feet Assistive device: Rolling walker (2 wheeled) Gait Pattern/deviations: Step-through pattern     General Gait Details: R knee buckling. Min assist due to impusivity of patient.     Balance Overall balance assessment: Needs assistance Sitting-balance support: No upper extremity supported;Feet supported;Feet unsupported;Single extremity supported Sitting balance-Leahy Scale: Good Sitting balance - Comments: Patient moved very quickly from sit to stand and did not require UE support   Standing balance support: Bilateral upper extremity supported Standing balance-Leahy Scale: Poor Standing balance comment: Patient began to lose  balance when moving around the bedside toilet                    Cognition Arousal/Alertness: Awake/alert Behavior During Therapy: [facility] for tasks assessed/performed Overall Cognitive Status: Within Functional Limits for tasks assessed                      Exercises General Exercises - Lower Extremity Ankle Circles/Pumps: AROM;Both;Other reps (comment);Seated (As often as possible)        Pertinent Vitals/Pain See vitals flow sheet.     Home Living Family/patient expects to be discharged to:: Private residence Living Arrangements: Spouse/significant other Available Help at Discharge: Family (spouse) Type of Home: House Home Access: Stairs to enter Entrance Stairs-Rails: None Home Layout: One level Home Equipment: Environmental consultant - 2 wheels      Prior Function Level of Independence: Independent          PT Goals (current goals can now be found in the care plan section) Acute Rehab PT Goals PT Goal Formulation: With patient/family Time For Goal Achievement: 01/14/14 Potential to Achieve Goals: Good    Frequency  7X/week     End of Session Equipment Utilized During Treatment: Gait belt;Right knee immobilizer Activity Tolerance: Patient tolerated treatment well Patient left: in chair;with call bell/phone within reach     Time: 1497-0263 PT Time Calculation (min): 18 min  Charges:                       G CodesEber Jones, SPT [phone]

## 2014-01-07 NOTE — Progress Notes (Signed)
[place], PA called back re: decreased responsiveness episode - orders received. Monitor patient until fully awake.

## 2014-01-07 NOTE — Care Management Note (Signed)
CARE MANAGEMENT NOTE 01/07/2014  Patient:  [name], [name]   Account Number:  [id number]  Date Initiated:  01/07/2014  Documentation initiated by:  Ricki Miller  Subjective/Objective Assessment:   68yr old female s/p right total knee arthroplasty.     Action/Plan:   Case manager spoke with patient's husband concerning home health and DME needs at discharge. Patient preoperatively setup with [facility], no changes. Has family support at discharge. DME has been delivered to home.   Anticipated DC Date:  01/08/2014   Anticipated DC Plan:  [place]  CM consult      [facility] Choice  HOME HEALTH  DURABLE MEDICAL EQUIPMENT   Choice offered to / List presented to:  C-3 Spouse   DME arranged  WALKER - ROLLING  3-N-1  CPM      DME agency  [employer]     [place] arranged  HH-2 PT      [facility] agency  [facility]   Status of service:  In process, will continue to follow Medicare Important Message given?   (If response is "NO", the following Medicare IM given date fields will be blank) Date Medicare IM given:   Date Additional Medicare IM given:    Discharge Disposition:    Per UR Regulation:  Reviewed for med. necessity/level of care/duration of stay  If discussed at [place] of Stay Meetings, dates discussed:    Comments:

## 2014-01-07 NOTE — Anesthesia Preprocedure Evaluation (Signed)
Anesthesia Evaluation   Patient awake    Reviewed: Allergy & Precautions, H&P , NPO status , Patient's Chart, lab work & pertinent test results  Airway       Dental   Pulmonary          Cardiovascular     Neuro/Psych  Headaches,    GI/Hepatic   Endo/Other    Renal/GU      Musculoskeletal   Abdominal   Peds  Hematology   Anesthesia Other Findings   Reproductive/Obstetrics                           Anesthesia Physical Anesthesia Plan  ASA: II  Anesthesia Plan: General   Post-op Pain Management:    Induction: Intravenous  Airway Management Planned: LMA and Oral ETT  Additional Equipment:   Intra-op Plan:   Post-operative Plan: Extubation in OR  Informed Consent: I have reviewed the patients History and Physical, chart, labs and discussed the procedure including the risks, benefits and alternatives for the proposed anesthesia with the patient or authorized representative who has indicated his/her understanding and acceptance.     Plan Discussed with:   Anesthesia Plan Comments:         Anesthesia Quick Evaluation

## 2014-01-07 NOTE — Anesthesia Procedure Notes (Addendum)
**Note De-Identified  Obfuscation** Procedure Name: LMA Insertion Date/Time: 01/07/2014 7:42 AM Performed by: Jacquiline Doe A Pre-anesthesia Checklist: Patient identified, Timeout performed, Emergency Drugs available, Suction available and Patient being monitored Patient Re-evaluated:Patient Re-evaluated prior to inductionOxygen Delivery Method: Circle system utilized Preoxygenation: Pre-oxygenation with 100% oxygen Intubation Type: IV induction Ventilation: Mask ventilation without difficulty and Oral airway inserted - appropriate to patient size LMA: LMA inserted LMA Size: 3.0 Tube type: Oral Number of attempts: 1 Placement Confirmation: breath sounds checked- equal and bilateral and positive ETCO2 Tube secured with: Tape Dental Injury: Teeth and Oropharynx as per pre-operative assessment    Anesthesia Regional Block:  Femoral nerve block  Pre-Anesthetic Checklist: ,, timeout performed, Correct Patient, Correct Site, Correct Laterality, Correct Procedure, Correct Position, site marked, Risks and benefits discussed,  Surgical consent,  Pre-op evaluation,  At surgeon's request and post-op pain management  Laterality: Right  Prep: Maximum Sterile Barrier Precautions used, chloraprep and alcohol swabs       Needles:  Injection technique: Single-shot  Needle Type: Stimulator Needle - 80        Needle insertion depth: 5 cm   Additional Needles:  Procedures: nerve stimulator Femoral nerve block  Nerve Stimulator or Paresthesia:  Response: 0.5 mA, 0.1 ms, 5 cm  Additional Responses:   Narrative:  Start time: 01/07/2014 6:55 AM End time: 01/07/2014 7:00 AM Injection made incrementally with aspirations every 5 mL.  Performed by: Personally  Anesthesiologist: Sharolyn Douglas MD  Additional Notes: Pt accepts procedure w/ risks. 18cc 0.5% Marcaine w/ epi w/o difficulty or discomfort. GES

## 2014-01-07 NOTE — Interval H&P Note (Signed)
History and Physical Interval Note:  01/07/2014 7:29 AM  [name]  has presented today for surgery, with the diagnosis of OA RIGHT KNEE  The various methods of treatment have been discussed with the patient and family. After consideration of risks, benefits and other options for treatment, the patient has consented to  Procedure(s): RIGHT TOTAL KNEE ARTHROPLASTY MEDIAL/LATERAL COMPARTMENTS WITH PATELLA RESURFACING (Right) as a surgical intervention .  The patient's history has been reviewed, patient examined, no change in status, stable for surgery.  I have reviewed the patient's chart and labs.  Questions were answered to the patient's satisfaction.     CAFFREY JR,W D

## 2014-01-07 NOTE — Progress Notes (Signed)
Patient awake -states "I'm feeling fine" - moving arms and legs in bed. 143/62- 97.3-73-16. O2 sat 100%.

## 2014-01-07 NOTE — Progress Notes (Signed)
Patient was gasping for air, un-arousable, and breathing less than 10 breaths per minute. Narcan was administered slowly, and a non-rebreather mask was applied. Breaths per minute increased to 11/minute immediately after the Narcan. Patient opened her eyes to her husband's voice, and began breathing quietly at 14 breaths/minute a few minutes after the [place]. Rapid Response RN was consulted. Bp 141/65, HR 93, 100% 10L non-rebreather. Will make Dr. French Ana aware and continue to monitor her closely.

## 2014-01-07 NOTE — H&P (View-Only) (Signed)
TOTAL KNEE ADMISSION H&P  Patient is being admitted for right total knee arthroplasty.  Subjective:  Chief Complaint:right knee pain.  HPI: [name], 68 y.o. female, has a history of pain and functional disability in the right knee due to arthritis and has failed non-surgical conservative treatments for greater than 12 weeks to includeNSAID's and/or analgesics, flexibility and strengthening excercises, use of assistive devices and activity modification.  Onset of symptoms was gradual, starting 7 years ago with gradually worsening course since that time. The patient noted no past surgery on the right knee(s).  Patient currently rates pain in the right knee(s) at 8 out of 10 with activity. Patient has night pain, worsening of pain with activity and weight bearing, pain that interferes with activities of daily living, pain with passive range of motion, crepitus and joint swelling.  Patient has evidence of periarticular osteophytes and joint space narrowing by imaging studies. There is no active infection.  There are no active problems to display for this patient.  Past Medical History  Diagnosis Date  . Arthritis     lower back & knees  . Depression     mild- meds for about 4 years  . Wears glasses   . Wears hearing aid     right ear    Past Surgical History  Procedure Laterality Date  . Mass removed from left ring finger  2013  . Mass excision  twice: 08/28/2011; 2012    Procedure: EXCISION MASS;  Surgeon: Wynonia Sours, MD;  Location: [place];  Service: Orthopedics;  Laterality: Left;  excision mass left ring finger  . Mass excision  02/11/2012    Procedure: EXCISION MASS;  Surgeon: Wynonia Sours, MD;  Location: [place];  Service: Orthopedics;  Laterality: Left;  EXCISION MUCOID CYST, DEBRIDEMENT INTERPHALANGEAL JOINT LEFT THUMB  . Joint replacement  2009    left knee replacement  . Back surgery  2007    lumbar fusion w/ rods & screws- lower back   . Cyst excision  2005    lumbar cyst  . Shoulder arthroscopy with rotator cuff repair and subacromial decompression Left 07/26/2013    Procedure: LEFT SHOULDER ARTHROSCOPY WITH /SUBACROMIAL DECOMPRESSION AND DISTAL CLAVICLE RESECTION;  Surgeon: Yvette Rack., MD;  Location: [place];  Service: Orthopedics;  Laterality: Left;     (Not in a hospital admission) Allergies  Allergen Reactions  . Sulfa Antibiotics Hives    History  Substance Use Topics  . Smoking status: Never Smoker   . Smokeless tobacco: Not on file  . Alcohol Use: No    No family history on file.   Review of Systems  Constitutional: Negative.   HENT: Positive for hearing loss and tinnitus. Negative for congestion, ear discharge, ear pain, nosebleeds and sore throat.   Eyes: Negative.   Respiratory: Negative.  Negative for stridor.   Cardiovascular: Negative.   Gastrointestinal: Negative.   Genitourinary: Negative.   Musculoskeletal: Positive for joint pain. Negative for falls.  Skin: Negative.   Neurological: Negative.  Negative for headaches.  Endo/Heme/Allergies: Negative.   Psychiatric/Behavioral: Negative.     Objective:  Physical Exam  Constitutional: She is oriented to person, place, and time. She appears well-developed and well-nourished. No distress.  HENT:  Head: Normocephalic and atraumatic.  Nose: Nose normal.  Eyes: Conjunctivae and EOM are normal. Pupils are equal, round, and reactive to light.  Neck: Normal range of motion. Neck supple.  Cardiovascular: Normal rate, regular  rhythm, normal heart sounds and intact distal pulses.   Respiratory: Effort normal and breath sounds normal. No respiratory distress. She has no wheezes. She exhibits no tenderness.  GI: Soft. Bowel sounds are normal. She exhibits no distension. There is no tenderness.  Musculoskeletal:       Right knee: She exhibits decreased range of motion, swelling and bony tenderness. Tenderness found.   Lymphadenopathy:    She has no cervical adenopathy.  Neurological: She is alert and oriented to person, place, and time. No cranial nerve deficit.  Skin: Skin is warm and dry. No rash noted. No erythema.  Psychiatric: She has a normal mood and affect. Her behavior is normal.    Vital signs in last 24 hours: @VSRANGES @  Labs:   Estimated body mass index is 20.49 kg/(m^2) as calculated from the following:   Height as of 07/26/13: 5\' 1"  (1.549 m).   Weight as of 07/26/13: 49.17 kg (108 lb 6.4 oz).   Imaging Review Plain radiographs demonstrate severe degenerative joint disease of the right knee(s). The overall alignment ismild varus. The bone quality appears to be good for age and reported activity level.  Assessment/Plan:  End stage arthritis, right knee   The patient history, physical examination, clinical judgment of the provider and imaging studies are consistent with end stage degenerative joint disease of the right knee(s) and total knee arthroplasty is deemed medically necessary. The treatment options including medical management, injection therapy arthroscopy and arthroplasty were discussed at length. The risks and benefits of total knee arthroplasty were presented and reviewed. The risks due to aseptic loosening, infection, stiffness, patella tracking problems, thromboembolic complications and other imponderables were discussed. The patient acknowledged the explanation, agreed to proceed with the plan and consent was signed. Patient is being admitted for inpatient treatment for surgery, pain control, PT, OT, prophylactic antibiotics, VTE prophylaxis, progressive ambulation and ADL's and discharge planning. The patient is planning to be discharged home with home health services

## 2014-01-07 NOTE — Brief Op Note (Signed)
01/07/2014  10:08 AM  PATIENT:  [name]  68 y.o. female  PRE-OPERATIVE DIAGNOSIS:  OA RIGHT KNEE  POST-OPERATIVE DIAGNOSIS:  OA RIGHT KNEE  PROCEDURE:  Procedure(s): RIGHT TOTAL KNEE ARTHROPLASTY MEDIAL/LATERAL COMPARTMENTS WITH PATELLA RESURFACING (Right)  SURGEON:  Surgeon(s) and Role:    * W D Valeta Harms., MD - Primary  PHYSICIAN ASSISTANT: Chriss Czar, PA-C  ASSISTANTS:   ANESTHESIA:   regional and general  EBL:  Total I/O In: 1160 [I.V.:1050; IV Piggyback:110] Out: -   BLOOD ADMINISTERED:none  DRAINS: 1 hemovac drain lateral right knee self suction   LOCAL MEDICATIONS USED:  NONE  SPECIMEN:  No Specimen  DISPOSITION OF SPECIMEN:  N/A  COUNTS:  YES  TOURNIQUET:   Total Tourniquet Time Documented: Thigh (Right) - 60 minutes Total: Thigh (Right) - 60 minutes   DICTATION: .Other Dictation: Dictation Number unknown  PLAN OF CARE: Admit to inpatient   PATIENT DISPOSITION:  PACU - hemodynamically stable.   Delay start of Pharmacological VTE agent (>24hrs) due to surgical blood loss or risk of bleeding: yes

## 2014-01-07 NOTE — Progress Notes (Signed)
RN called about 3pm when patient was difficult to arouse and had slow RR.  Agreed with RN that narcan was a good treatment option.  RN pushed Narcan and patient began to arouse and communicate with staff.   1550:  Patient still sleepy but easy to arouse with good deep breathing and cough. VSS  RN stated that narcotic dosing has been adjusted.  RN to call if assistance needed.

## 2014-01-07 NOTE — Transfer of Care (Signed)
Immediate Anesthesia Transfer of Care Note  Patient: [name]  Procedure(s) Performed: Procedure(s): RIGHT TOTAL KNEE ARTHROPLASTY MEDIAL/LATERAL COMPARTMENTS WITH PATELLA RESURFACING (Right)  Patient Location: PACU  Anesthesia Type:GA combined with regional for post-op pain  Level of Consciousness: sedated, patient cooperative and responds to stimulation  Airway & Oxygen Therapy: Patient Spontanous Breathing and Patient connected to nasal cannula oxygen  Post-op Assessment: Report given to PACU RN, Post -op Vital signs reviewed and stable, Patient moving all extremities and Patient moving all extremities X 4  Post vital signs: Reviewed and stable  Complications: No apparent anesthesia complications

## 2014-01-07 NOTE — Anesthesia Postprocedure Evaluation (Signed)
  Anesthesia Post-op Note  Patient: [name]  Procedure(s) Performed: Procedure(s): RIGHT TOTAL KNEE ARTHROPLASTY MEDIAL/LATERAL COMPARTMENTS WITH PATELLA RESURFACING (Right)  Patient Location: PACU  Anesthesia Type:GA combined with regional for post-op pain  Level of Consciousness: awake, oriented, sedated and patient cooperative  Airway and Oxygen Therapy: Patient Spontanous Breathing  Post-op Pain: mild  Post-op Assessment: Post-op Vital signs reviewed, Patient's Cardiovascular Status Stable, Respiratory Function Stable, Patent Airway and No signs of Nausea or vomiting  Post-op Vital Signs: stable  Last Vitals:  Filed Vitals:   01/07/14 1115  BP:   Pulse: 65  Temp:   Resp: 17    Complications: No apparent anesthesia complications

## 2014-01-08 DIAGNOSIS — D62 Acute posthemorrhagic anemia: Secondary | ICD-10-CM

## 2014-01-08 DIAGNOSIS — F32A Depression, unspecified: Secondary | ICD-10-CM | POA: Diagnosis present

## 2014-01-08 DIAGNOSIS — F329 Major depressive disorder, single episode, unspecified: Secondary | ICD-10-CM | POA: Diagnosis present

## 2014-01-08 LAB — CBC
HCT: 28.7 % — ABNORMAL LOW (ref 36.0–46.0)
HEMOGLOBIN: 9.4 g/dL — AB (ref 12.0–15.0)
MCH: 29.9 pg (ref 26.0–34.0)
MCHC: 32.8 g/dL (ref 30.0–36.0)
MCV: 91.4 fL (ref 78.0–100.0)
PLATELETS: 240 10*3/uL (ref 150–400)
RBC: 3.14 MIL/uL — ABNORMAL LOW (ref 3.87–5.11)
RDW: 12.9 % (ref 11.5–15.5)
WBC: 11.3 10*3/uL — ABNORMAL HIGH (ref 4.0–10.5)

## 2014-01-08 LAB — BASIC METABOLIC PANEL
BUN: 18 mg/dL (ref 6–23)
CALCIUM: 8.3 mg/dL — AB (ref 8.4–10.5)
CO2: 26 mEq/L (ref 19–32)
Chloride: 102 mEq/L (ref 96–112)
Creatinine, Ser: 0.6 mg/dL (ref 0.50–1.10)
GFR calc Af Amer: >90 mL/min (ref >90)
GLUCOSE: 142 mg/dL — AB (ref 70–99)
Potassium: 4.4 mEq/L (ref 3.7–5.3)
SODIUM: 139 meq/L (ref 137–147)

## 2014-01-08 MED ORDER — HYDROCODONE-ACETAMINOPHEN 5-325 MG PO TABS
1.0000 | ORAL_TABLET | ORAL | Status: DC | PRN
  Administered 2014-01-08 – 2014-01-09 (×3): 2 via ORAL
  Filled 2014-01-08 (×3): qty 2

## 2014-01-08 MED ORDER — KETOROLAC TROMETHAMINE 15 MG/ML IJ SOLN
7.5000 mg | Freq: Four times a day (QID) | INTRAMUSCULAR | Status: DC | PRN
  Administered 2014-01-08 – 2014-01-09 (×2): 7.5 mg via INTRAVENOUS

## 2014-01-08 MED ORDER — KETOROLAC TROMETHAMINE 15 MG/ML IJ SOLN
INTRAMUSCULAR | Status: AC
  Filled 2014-01-08: qty 1

## 2014-01-08 MED ORDER — METHOCARBAMOL 500 MG PO TABS
500.0000 mg | ORAL_TABLET | Freq: Four times a day (QID) | ORAL | Status: DC | PRN
  Administered 2014-01-09 – 2014-01-10 (×5): 500 mg via ORAL
  Filled 2014-01-08 (×5): qty 1

## 2014-01-08 MED ORDER — OXYCODONE HCL 5 MG PO TABS
5.0000 mg | ORAL_TABLET | Freq: Four times a day (QID) | ORAL | Status: DC | PRN
  Administered 2014-01-09 – 2014-01-10 (×5): 5 mg via ORAL
  Filled 2014-01-08 (×5): qty 1

## 2014-01-08 NOTE — Progress Notes (Signed)
Physical Therapy Treatment Patient Details Name: [name] MRN: [id number] DOB: 17-Nov-1945 Today's Date: 10-Jan-2014    History of Present Illness 68 y.o. pt s/p R TKA on 01/07/14. PMHx of L TKA, arthritis, lumbar fusion, and L rotator cuff repair.    PT Comments    Pt. making great progress with mobility today.  Follow Up Recommendations  Home health PT     Equipment Recommendations  Rolling walker with 5" wheels       Precautions / Restrictions Precautions Precautions: Knee Precaution Comments: re-educated on no pillow under knee Required Braces or Orthoses: Knee Immobilizer - Right Knee Immobilizer - Right: On when out of bed or walking Restrictions Weight Bearing Restrictions: Yes RLE Weight Bearing: Weight bearing as tolerated    Mobility  Bed Mobility Overal bed mobility: Modified Independent             General bed mobility comments: with cues pt able to get self to edge of bed with bed flat and no rails used.  Transfers Overall transfer level: Needs assistance Equipment used: Rolling walker (2 wheeled) Transfers: Sit to/from Stand Sit to Stand: Min guard         General transfer comment: cues on hand placement and ant weight shift with standing.  Ambulation/Gait Ambulation/Gait assistance: Min guard Ambulation Distance (Feet): 200 Feet Assistive device: Rolling walker (2 wheeled) Gait Pattern/deviations: Step-through pattern;Decreased stride length;Antalgic Gait velocity: decreased Gait velocity interpretation: Below normal speed for age/gender     Stairs            Wheelchair Mobility    Modified Rankin (Stroke Patients Only)          Cognition Arousal/Alertness: Awake/alert Behavior During Therapy: WFL for tasks assessed/performed Overall Cognitive Status: Within Functional Limits for tasks assessed                      Exercises Total Joint Exercises Ankle Circles/Pumps: AROM;Strengthening;Both;10  reps;Supine Quad Sets: AROM;Strengthening;Right;10 reps;Supine Heel Slides: AROM;Strengthening;Right;10 reps;Supine Straight Leg Raises: AROM;Strengthening;Right;10 reps;Supine Long Arc Quad: AAROM;Strengthening;Right;5 reps;Seated Knee Flexion: AAROM;Strengthening;Right;5 reps;Seated       Home Living Family/patient expects to be discharged to:: Private residence Living Arrangements: Spouse/significant other Available Help at Discharge: Family Type of Home: House Home Access: Stairs to enter Entrance Stairs-Rails: None Home Layout: One level Home Equipment: Environmental consultant - 2 wheels      Prior Function Level of Independence: Independent          PT Goals (current goals can now be found in the care plan section) Acute Rehab PT Goals PT Goal Formulation: With family Time For Goal Achievement: 01/14/14 Potential to Achieve Goals: Good Progress towards PT goals: Progressing toward goals    Frequency  7X/week    PT Plan Current plan remains appropriate       End of Session Equipment Utilized During Treatment: Gait belt;Right knee immobilizer Activity Tolerance: Patient tolerated treatment well Patient left: in chair;with call bell/phone within reach;with family/visitor present     Time: 1000-1030 PT Time Calculation (min): 30 min  Charges:  $Gait Training: 8-22 mins $Therapeutic Exercise: 8-22 mins                    G Codes:      Willow Ora 01/10/2014, 12:46 PM  Willow Ora, PTA Office- [phone]

## 2014-01-08 NOTE — Op Note (Signed)
NAME:  [name], [name] NO.:  [id number]  MEDICAL RECORD NO.:  [id number]  LOCATION:  [facility]                         FACILITY:  [facility]  PHYSICIAN:  Lockie Pares, M.D.    DATE OF BIRTH:  10-24-1945  DATE OF PROCEDURE: DATE OF DISCHARGE:                              OPERATIVE REPORT   INDICATIONS:  A 68 year old with significant osteoarthritis, right knee with flexion contracture and varus deformity thought to be amenable hospitalization.  PREOPERATIVE DIAGNOSIS:  Osteoarthritis, right knee.  POSTOPERATIVE DIAGNOSIS:  Osteoarthritis, right knee.  OPERATION:  Right total knee replacement (Sigma, size 2 femur and tibia with all poly patella and 10 mm bearing).  SURGEON:  Lockie Pares, M.D.  ASSISTANT:  Chriss Czar, PA.  ANESTHESIA:  General with a femoral nerve block.  TOURNIQUET TIME:  One hour.  DESCRIPTION OF PROCEDURE:  Sterile prep and drape, exsanguination of leg inflation to 300.  Straight skin incision with medial parapatellar approach to the knee made.  We had cut 11 mm off the distal femur and a 5 degree valgus inclination followed by a cut on the most diseased medial side of the tibia about 3 mm below the most diseased medial compartment with about -2 slope, then checked the extension gap at 10 mm.  Provisional recut made to attain full extension was noted on the tibia of additional 2 mm.  The femur was sized to be size 2.  We then placed the pins for the size femur with the anterior-posterior chamfer cut guide with the appropriate degree of external rotation with the 10 mm shin for balancing and rotation.  We then cut all cuts.  Excess menisci were removed from the knee, some posterior osteophytes as well as release of the PCL.  Flexion gap equaled the extension gap at 10 mm. Keel hole was cut for the tibia.  Then, a trial base plate size 2 was elected to be used.  We then cut the box for the femur.  Then, the femoral trial was placed in the  tibial trial with full extension, excellent stability, balancing, no tendency for bearing spin out.  The trial was placed within cut, the patella leaving about 14 mm of native patella using all-poly patella.  All trials were deemed to be acceptable.  The trials were removed.  The bony surfaces were irrigated.  We then inserted the final components, tibia followed by femur, patella.  We elected to use a trial bearing, allowed the cement to harden.  We removed the trial bearings.  Small amount excess cement was removed from the posterior aspect of the knee.  Tourniquet was released.  No excessive bleeding was noted.  We then placed the final bearing.  Hemovac drain was placed superolaterally, closure was effected with #1 Ethibond, 2-0 Vicryl, and skin clips.  Lightly compressive sterile dressing applied.  Taken to recovery room in stable condition.     Lockie Pares, M.D.     WDC/MEDQ  D:  01/07/2014  T:  01/07/2014  Job:  [id number]

## 2014-01-08 NOTE — Progress Notes (Signed)
[facility] Progress Note weekend coverage  Subjective  Patient doing well this morning Pain is controlled  Events of yesterday noted. Medications changed. O2 sats much better Patient sitting in bedside chair. Tolerating diet Voiding on bedpan + flatus No BM, last BM on Thursday  Using CPM  Review of Systems  Constitutional: Negative for fever and chills.  Eyes: Negative for blurred vision.  Respiratory: Negative for shortness of breath and wheezing.   Cardiovascular: Negative for chest pain and palpitations.  Gastrointestinal: Negative for nausea, vomiting and abdominal pain.  Genitourinary: Negative for dysuria.  Musculoskeletal:       Right knee pain  Neurological: Negative for tingling, sensory change and headaches.     Objective   BP 116/68  Pulse 73  Temp(Src) 98.7 F (37.1 C) (Oral)  Resp 18  Ht 5' (1.524 m)  Wt 51.166 kg (112 lb 12.8 oz)  BMI 22.03 kg/m2  SpO2 100%  Intake/Output     06/26 0701 - 06/27 0700 06/27 0701 - 06/28 0700   P.O. 260    I.V. (mL/kg) 1936.3 (37.8)    IV Piggyback 110    Total Intake(mL/kg) 2306.3 (45.1)    Urine (mL/kg/hr) 550 (0.4)    Drains 135 (0.1)    Blood 75 (0.1)    Total Output 760     Net +1546.3          Urine Occurrence 3 x      Labs  Results for CARLON, DAVIDSON (MRN [id number]) as of 01/08/2014 11:05  Ref. Range 01/08/2014 05:30  Sodium Latest Range: 137-147 mEq/L 139  Potassium Latest Range: 3.7-5.3 mEq/L 4.4  Chloride Latest Range: 96-112 mEq/L 102  CO2 Latest Range: 19-32 mEq/L 26  BUN Latest Range: 6-23 mg/dL 18  Creatinine Latest Range: 0.50-1.10 mg/dL 0.60  Calcium Latest Range: 8.4-10.5 mg/dL 8.3 (L)  GFR calc non Af Amer Latest Range: >90 mL/min >90  GFR calc Af Amer Latest Range: >90 mL/min >90  Glucose Latest Range: 70-99 mg/dL 142 (H)  WBC Latest Range: 4.0-10.5 K/uL 11.3 (H)  RBC Latest Range: 3.87-5.11 MIL/uL 3.14 (L)  Hemoglobin Latest Range: 12.0-15.0 g/dL 9.4 (L)  HCT  Latest Range: 36.0-46.0 % 28.7 (L)  MCV Latest Range: 78.0-100.0 fL 91.4  MCH Latest Range: 26.0-34.0 pg 29.9  MCHC Latest Range: 30.0-36.0 g/dL 32.8  RDW Latest Range: 11.5-15.5 % 12.9  Platelets Latest Range: 150-400 K/uL 240    Exam   Gen: Awake and alert, sitting in bedside chair, no acute distress  Lungs: Clear anterior fields  Cardiac: Regular rate and rhythm, S1 and S2  Abd: Soft, nontender, nondistended, + bowel sounds  Ext:   Right lower extremity    Dressing is clean dry and intact. Drain is patent   Swelling is controlled    DPN, SPN, TN sensory functions are intact    EHL, FHL, anterior tibialis, posterior tibialis, peroneals and gastrocsoleus complex motor function are intact    Patient can perform a quad set    Compartments are soft and nontender. No pain with passive stretching    Extremity is warm    Palpable dorsalis pedis pulse     patient resting with her heel elevated and leg working on extension    Assessment and Plan   POD/HD#: 68   68 year old status post right total knee arthroplasty for end-stage the right knee   1. Endstage DJD right knee status post right TKA   Dressing change tomorrow  Weight-bear as tolerated  Range  of motion as tolerated and continue with CPM  Total knee precautions  Ice and elevate  PT and OT  Possible discharge home tomorrow  2. Pain management:  Low-dose narcotics as needed for severe pain. Again patient did have decreased respirations yesterday after surgery on the floor which was responsive to Narcan. Continue to monitor  Low-dose Norco has been ordered  Will also order PRN ketorolac 7.5 mg IV every 6 hours PRN    3. ABL anemia/Hemodynamics  Patient currently asymptomatic  Continue to monitor  CBC in a.m.  4. Medical issues   Resume home meds  5. DVT/PE prophylaxis:  SCDs, TED hose  Patient on Rivaroxaban 6. ID:   Completed perioperative antibiotics  7. FEN/Foley/Lines:  KVO IV fluids  8.  Dispo:  Therapies  Dressing change tomorrow with possible discharge to home and doing well  Arrange home health    Jari Pigg, PA-C [facility] [phone] (P) 01/08/2014 11:03 AM  **Disclaimer: This note may have been dictated with voice recognition software. Similar sounding words can inadvertently be transcribed and this note may contain transcription errors which may not have been corrected upon publication of note.**

## 2014-01-08 NOTE — Evaluation (Addendum)
Occupational Therapy Evaluation Patient Details Name: [name] MRN: [id number] DOB: 06-11-1946 Today's Date: 01/08/2014    History of Present Illness 68 y.o. pt s/p R TKA on 01/07/14. PMHx of L TKA, arthritis, lumbar fusion, and L rotator cuff repair.   Clinical Impression   Pt s/p above. Education provided during session and pt and spouse verbalize they feel good about information covered. Pt will have assistance available at home upon d/c. No further OT needs.      Follow Up Recommendations  No OT follow up;Supervision - Intermittent    Equipment Recommendations  None recommended by OT    Recommendations for Other Services       Precautions / Restrictions Precautions Precautions: Knee Required Braces or Orthoses: Knee Immobilizer - Right Knee Immobilizer - Right: On when out of bed or walking Restrictions Weight Bearing Restrictions: Yes RLE Weight Bearing: Weight bearing as tolerated      Mobility Bed Mobility Overal bed mobility: Modified Independent             General bed mobility comments: observed sit to supine  Transfers Overall transfer level: Needs assistance Equipment used: Rolling walker (2 wheeled) Transfers: Sit to/from Stand Sit to Stand: Min guard;Mod assist;Min assist         General transfer comment: Mod A for sit to stand from shower chair as pt appeared to almost fall and chair wobbly-practiced again and pt at [place] A level to stabilize chair    Balance                                            ADL Overall ADL's : Needs assistance/impaired                     Lower Body Dressing: Sit to/from stand;Minimal assistance   Toilet Transfer: Min guard;Ambulation;Minimal assistance;RW (bed)       Tub/ Shower Transfer: Moderate assistance;Ambulation;Shower seat;Rolling walker;Minimal assistance   Functional mobility during ADLs: Min guard;Rolling walker;Minimal assistance General ADL Comments: Educated on  dressing technique and safety tips (safe shoes, rugs, clutter, bag on walker, sitting for most of LB ADLs). Educated on footsie roll. Recommended spouse be with her for tub transfer. Practiced and pt initially requiring a lot of assistance for sit to stand from shower chair as chair appeared to move and pt moving quickly-OT educated and pt practiced again and did much better (assistance to hold chair still).  Pt/spouse verbalize they feel good with information covered. Discussed what she could use for shower chair.     Vision                     Perception     Praxis      Pertinent Vitals/Pain Pain 8/10. Placed in footsie roll. Increased activity during session.     Hand Dominance Left   Extremity/Trunk Assessment Upper Extremity Assessment Upper Extremity Assessment: Overall WFL for tasks assessed (previous shoulder surgery)   Lower Extremity Assessment Lower Extremity Assessment: Defer to PT evaluation       Communication Communication Communication: No difficulties   Cognition Arousal/Alertness: Awake/alert Behavior During Therapy: WFL for tasks assessed/performed Overall Cognitive Status: Within Functional Limits for tasks assessed                     General Comments       Exercises  Shoulder Instructions      Home Living Family/patient expects to be discharged to:: Private residence Living Arrangements: Spouse/significant other Available Help at Discharge: Family Type of Home: House Home Access: Stairs to enter [employer] of Steps: 2 in garage entrance (5 or 7 in front but won't use) Entrance Stairs-Rails: None Home Layout: One level     Bathroom Shower/Tub: Teacher, early years/pre:  (has BSC)     Home Equipment: Environmental consultant - 2 wheels          Prior Functioning/Environment Level of Independence: Independent             OT Diagnosis:     OT Problem List:     OT Treatment/Interventions:      OT  Goals(Current goals can be found in the care plan section)    OT Frequency:     Barriers to D/C:            Co-evaluation              End of Session Equipment Utilized During Treatment: Gait belt;Rolling walker;Right knee immobilizer CPM Right Knee CPM Right Knee: Off   Activity Tolerance: Patient tolerated treatment well Patient left: in bed;with call bell/phone within reach;with family/visitor present   Time: 0962-8366 OT Time Calculation (min): 25 min Charges:  OT General Charges $OT Visit: 1 Procedure OT Evaluation $Initial OT Evaluation Tier I: 1 Procedure OT Treatments $Self Care/Home Management : 8-22 mins G-CodesBenito Mccreedy OTR/L [phone] 01/08/2014, 11:47 AM

## 2014-01-08 NOTE — Discharge Instructions (Signed)
Diet: As you were doing prior to hospitalization  ° °Activity: Increase activity slowly as tolerated  °No lifting or driving for 6 weeks  ° °Shower: May shower without a dressing on post op day #5, NO SOAKING in tub  ° °Dressing: You may change your dressing on post op day #3.  °Then change the dressing daily with sterile 4"x4"s gauze dressing  °And TED hose for knees. Use paper tape to hold dressing in place  °For hips. You may clean the incision with alcohol prior to redressing.  ° °Weight Bearing: weight bearing as taught in physical therapy. Use a walker or  °Crutches as instructed.  ° °To prevent constipation: you may use a stool softener such as -  °Colace ( over the counter) 100 mg by mouth twice a day  °Drink plenty of fluids ( prune juice may be helpful) and high fiber foods  °Miralax ( over the counter) for constipation as needed.  ° °Precautions: If you experience chest pain or shortness of breath - call 911 immediately For transfer to the hospital emergency department!!  °If you develop a fever greater that 101 F, purulent drainage from wound, increased redness or drainage from wound, or calf pain -- Call the office  ° °Follow- Up Appointment: Please call for an appointment to be seen in 2 weeks  °[facility] - [phone] ° ° °Information on my medicine - XARELTO® (Rivaroxaban) ° °This medication education was reviewed with me or my healthcare representative as part of my discharge preparation.  The pharmacist that spoke with me during my hospital stay was:  Khyla Mccumbers P, RPH ° °Why was Xarelto® prescribed for you? °Xarelto® was prescribed for you to reduce the risk of blood clots forming after orthopedic surgery. The medical term for these abnormal blood clots is venous thromboembolism (VTE). ° °What do you need to know about xarelto® ? °Take your Xarelto® ONCE DAILY at the same time every day. °You may take it either with or without food. ° °If you have difficulty swallowing the tablet whole, you  may crush it and mix in applesauce just prior to taking your dose. ° °Take Xarelto® exactly as prescribed by your doctor and DO NOT stop taking Xarelto® without talking to the doctor who prescribed the medication.  Stopping without other VTE prevention medication to take the place of Xarelto® may increase your risk of developing a clot. ° °After discharge, you should have regular check-up appointments with your healthcare provider that is prescribing your Xarelto®.   ° °What do you do if you miss a dose? °If you miss a dose, take it as soon as you remember on the same day then continue your regularly scheduled once daily regimen the next day. Do not take two doses of Xarelto® on the same day.  ° °Important Safety Information °A possible side effect of Xarelto® is bleeding. You should call your healthcare provider right away if you experience any of the following: °? Bleeding from an injury or your nose that does not stop. °? Unusual colored urine (red or dark brown) or unusual colored stools (red or black). °? Unusual bruising for unknown reasons. °? A serious fall or if you hit your head (even if there is no bleeding). ° °Some medicines may interact with Xarelto® and might increase your risk of bleeding while on Xarelto®. To help avoid this, consult your healthcare provider or pharmacist prior to using any new prescription or non-prescription medications, including herbals, vitamins, non-steroidal anti-inflammatory drugs (NSAIDs) and   supplements. ° °This website has more information on Xarelto®: www.xarelto.com. ° ° °

## 2014-01-09 LAB — CBC
HCT: 22.5 % — ABNORMAL LOW (ref 36.0–46.0)
HCT: 34.5 % — ABNORMAL LOW (ref 36.0–46.0)
HEMOGLOBIN: 11.6 g/dL — AB (ref 12.0–15.0)
Hemoglobin: 7.4 g/dL — ABNORMAL LOW (ref 12.0–15.0)
MCH: 30.3 pg (ref 26.0–34.0)
MCH: 30.6 pg (ref 26.0–34.0)
MCHC: 32.9 g/dL (ref 30.0–36.0)
MCHC: 33.6 g/dL (ref 30.0–36.0)
MCV: 92.2 fL (ref 78.0–100.0)
MCV: 91 fL (ref 78.0–100.0)
PLATELETS: 188 10*3/uL (ref 150–400)
Platelets: 199 10*3/uL (ref 150–400)
RBC: 2.44 MIL/uL — AB (ref 3.87–5.11)
RBC: 3.79 MIL/uL — AB (ref 3.87–5.11)
RDW: 13.3 % (ref 11.5–15.5)
RDW: 14.1 % (ref 11.5–15.5)
WBC: 13.2 10*3/uL — ABNORMAL HIGH (ref 4.0–10.5)
WBC: 10.8 10*3/uL — AB (ref 4.0–10.5)

## 2014-01-09 LAB — PREPARE RBC (CROSSMATCH)

## 2014-01-09 MED ORDER — KETOROLAC TROMETHAMINE 15 MG/ML IJ SOLN
INTRAMUSCULAR | Status: AC
  Filled 2014-01-09: qty 1

## 2014-01-09 MED ORDER — FUROSEMIDE 10 MG/ML IJ SOLN
20.0000 mg | Freq: Once | INTRAMUSCULAR | Status: AC
  Administered 2014-01-09: 20 mg via INTRAVENOUS
  Filled 2014-01-09: qty 2

## 2014-01-09 MED ORDER — DIPHENHYDRAMINE HCL 25 MG PO CAPS
25.0000 mg | ORAL_CAPSULE | Freq: Once | ORAL | Status: AC
  Administered 2014-01-09: 25 mg via ORAL
  Filled 2014-01-09: qty 1

## 2014-01-09 MED ORDER — ACETAMINOPHEN 325 MG PO TABS
650.0000 mg | ORAL_TABLET | Freq: Once | ORAL | Status: AC
  Administered 2014-01-09: 650 mg via ORAL
  Filled 2014-01-09: qty 2

## 2014-01-09 NOTE — Progress Notes (Addendum)
2200 pt began to have severe pain in her right knee unrelieved by vicodin or toradol 7.5 mg. Ainsley Spinner, PA notified. Orders received for oxy ir and robaxin. Order received to pull hemovac since tubing was cracked and leaking in bed. When hemovac tube was removed a large clot was pulled out as well.

## 2014-01-09 NOTE — Progress Notes (Signed)
Physical Therapy Treatment Patient Details Name: [name] MRN: [id number] DOB: 08-29-1945 Today's Date: 02/04/2014    History of Present Illness 68 y.o. pt s/p R TKA on 01/07/14. PMHx of L TKA, arthritis, lumbar fusion, and L rotator cuff repair.    PT Comments    Progressing well despite having low Hgb today and increased pain from yesterday. Will plan for stair education tomorrow if Hbg improves with blood transfusion in prep for discharge home with spouse.  Follow Up Recommendations  Home health PT     Equipment Recommendations  Rolling walker with 5" wheels       Precautions / Restrictions Precautions Precautions: Knee Required Braces or Orthoses: Knee Immobilizer - Right Knee Immobilizer - Right: On when out of bed or walking Restrictions RLE Weight Bearing: Weight bearing as tolerated    Mobility  Bed Mobility Overal bed mobility: Modified Independent             General bed mobility comments: with cues pt able to get self to edge of bed with bed flat and no rails used.  Transfers Overall transfer level: Needs assistance Equipment used: Rolling walker (2 wheeled) Transfers: Sit to/from Stand Sit to Stand: Supervision         General transfer comment: cues on hand placement and ant weight shift with standing.  Ambulation/Gait Ambulation/Gait assistance: Min guard Ambulation Distance (Feet): 150 Feet Assistive device: Rolling walker (2 wheeled) Gait Pattern/deviations: Step-to pattern;Antalgic;Step-through pattern;Decreased stride length   Gait velocity interpretation: Below normal speed for age/gender General Gait Details: decreased distance today due to fatique (hgb low today-pt to recieve blood transfusion). No dizziness reported with any activity.   Stairs            Wheelchair Mobility    Modified Rankin (Stroke Patients Only)          Cognition Arousal/Alertness: Awake/alert Behavior During Therapy: WFL for tasks  assessed/performed Overall Cognitive Status: Within Functional Limits for tasks assessed                      Exercises Total Joint Exercises Ankle Circles/Pumps: AROM;Both;10 reps;Supine Quad Sets: AROM;Strengthening;Right;10 reps;Supine Short Arc Quad: AAROM;Strengthening;Right;10 reps;Supine Heel Slides: AAROM;Strengthening;Right;10 reps;Supine Hip ABduction/ADduction: AROM;Strengthening;Right;10 reps;Supine Straight Leg Raises: AAROM;Strengthening;Right;10 reps;Supine           PT Goals (current goals can now be found in the care plan section) Acute Rehab PT Goals PT Goal Formulation: With family Time For Goal Achievement: 01/14/14 Potential to Achieve Goals: Good Progress towards PT goals: Progressing toward goals    Frequency  7X/week    PT Plan Current plan remains appropriate       End of Session   Activity Tolerance: Patient tolerated treatment well Patient left: with call bell/phone within reach;with family/visitor present;in bed     Time: 9357-0177 PT Time Calculation (min): 28 min  Charges:  $Gait Training: 8-22 mins $Therapeutic Exercise: 8-22 mins                    G Codes:      Willow Ora 02/04/2014, 1:04 PM  Willow Ora, PTA Office- [phone]

## 2014-01-09 NOTE — Progress Notes (Signed)
[facility] Progress Note weekend coverage  Subjective  Events from last night are noted, nursing note reviewed Patient doing much better this morning Pain is improved Has been out of bed this morning to use the bathroom Patient denies any lightheadedness or dizziness but again has not been up 1 time today Drain was pulled yesterday as the tubing was cracked and was leaking over the bed  Review of Systems  Constitutional: Negative for fever and chills.       No dizziness or lightheadedness  Eyes: Negative for blurred vision.  Respiratory: Negative for shortness of breath and wheezing.   Cardiovascular: Negative for chest pain and palpitations.  Gastrointestinal: Negative for nausea, vomiting and abdominal pain.  Genitourinary: Negative for dysuria.  Musculoskeletal:       Right knee pain is improved  Neurological: Negative for tingling, sensory change, weakness and headaches.      Objective   BP 106/47  Pulse 90  Temp(Src) 99.3 F (37.4 C) (Oral)  Resp 16  Ht 5' (1.524 m)  Wt 51.166 kg (112 lb 12.8 oz)  BMI 22.03 kg/m2  SpO2 98%  Intake/Output     06/27 0701 - 06/28 0700 06/28 0701 - 06/29 0700   P.O. 1200    I.V. (mL/kg)     IV Piggyback     Total Intake(mL/kg) 1200 (23.5)    Urine (mL/kg/hr)     Drains 150 (0.1)    Blood     Total Output 150     Net +1050          Urine Occurrence 3 x      Labs  Results for LORIJEAN, HUSSER (MRN [id number]) as of 01/09/2014 10:04  Ref. Range 01/09/2014 05:14  WBC Latest Range: 4.0-10.5 K/uL 13.2 (H)  RBC Latest Range: 3.87-5.11 MIL/uL 2.44 (L)  Hemoglobin Latest Range: 12.0-15.0 g/dL 7.4 (L)  HCT Latest Range: 36.0-46.0 % 22.5 (L)  MCV Latest Range: 78.0-100.0 fL 92.2  MCH Latest Range: 26.0-34.0 pg 30.3  MCHC Latest Range: 30.0-36.0 g/dL 32.9  RDW Latest Range: 11.5-15.5 % 13.3  Platelets Latest Range: 150-400 K/uL 199   CBC Latest Ref Rng 01/09/2014 01/08/2014 12/28/2013  WBC 4.0 - 10.5 K/uL 13.2(H)  11.3(H) 7.5  Hemoglobin 12.0 - 15.0 g/dL 7.4(L) 9.4(L) 12.5  Hematocrit 36.0 - 46.0 % 22.5(L) 28.7(L) 37.5  Platelets 150 - 400 K/uL 199 240 315      Exam  Gen: Awake and alert, resting comfortably in bed, no acute distress. Lungs: Clear to auscultation bilaterally Cardiac: Regular rate and rhythm, S1 and S2 Abd: Soft, nontender, nondistended, + bowel sounds Ext:   Right lower extremity       Incision looks fantastic. Pin site is clean. No signs of infection. No drainage.                         Swelling is controlled                           DPN, SPN, TN sensory functions are intact                           EHL, FHL, anterior tibialis, posterior tibialis, peroneals and gastrocsoleus complex motor function are intact                           Patient  can perform a quad set                           Compartments are soft and nontender. No pain with passive stretching                           Extremity is warm                           Palpable dorsalis pedis pulse    Assessment and Plan   POD/HD#: 44   68 year old status post right total knee arthroplasty for end-stage the right knee   1. Endstage DJD right knee status post right TKA               Dressing changed, dressing changes as needed. Ace wrap applied             Weight-bear as tolerated             Range of motion as tolerated and continue with CPM             Total knee precautions             Ice and elevate             PT and OT               2. Pain management:              continue with Norco  OxyIR added last night for uncontrolled pain. Patient tolerated well  Continue his current regimen               3. ABL anemia/Hemodynamics              a significant drop from preop H&H  Blood pressures on the lower end.  Proceed with transfusion of 2 units of packed red cells  4. Medical issues               Resume home meds  5. DVT/PE prophylaxis:             SCDs, TED hose             Patient on  Rivaroxaban 6. ID:               Completed perioperative antibiotics  7. FEN/Foley/Lines:             KVO IV fluids  Diet as tolerated  8. Dispo:             probable DC home tomorrow    Jari Pigg, PA-C [facility] [phone] (P) 01/09/2014 10:01 AM  **Disclaimer: This note may have been dictated with voice recognition software. Similar sounding words can inadvertently be transcribed and this note may contain transcription errors which may not have been corrected upon publication of note.**

## 2014-01-09 NOTE — Progress Notes (Signed)
No reaction noted to first unit of PRBCs.

## 2014-01-09 NOTE — Progress Notes (Signed)
No reaction noted with the 2nd unit of PRBCs.

## 2014-01-10 LAB — TYPE AND SCREEN
ABO/RH(D): O POS
ANTIBODY SCREEN: NEGATIVE
UNIT DIVISION: 0
Unit division: 0

## 2014-01-10 LAB — BASIC METABOLIC PANEL
BUN: 27 mg/dL — AB (ref 6–23)
CALCIUM: 8.2 mg/dL — AB (ref 8.4–10.5)
CHLORIDE: 103 meq/L (ref 96–112)
CO2: 27 mEq/L (ref 19–32)
CREATININE: 0.7 mg/dL (ref 0.50–1.10)
GFR, EST NON AFRICAN AMERICAN: 86 mL/min — AB (ref >90)
Glucose, Bld: 118 mg/dL — ABNORMAL HIGH (ref 70–99)
Potassium: 4.6 mEq/L (ref 3.7–5.3)
Sodium: 140 mEq/L (ref 137–147)

## 2014-01-10 LAB — CBC
HCT: 31.7 % — ABNORMAL LOW (ref 36.0–46.0)
Hemoglobin: 10.5 g/dL — ABNORMAL LOW (ref 12.0–15.0)
MCH: 29.7 pg (ref 26.0–34.0)
MCHC: 33.1 g/dL (ref 30.0–36.0)
MCV: 89.5 fL (ref 78.0–100.0)
PLATELETS: 179 10*3/uL (ref 150–400)
RBC: 3.54 MIL/uL — ABNORMAL LOW (ref 3.87–5.11)
RDW: 14.4 % (ref 11.5–15.5)
WBC: 10.2 10*3/uL (ref 4.0–10.5)

## 2014-01-10 NOTE — Progress Notes (Signed)
Subjective: 3 Days Post-Op Procedure(s) (LRB): RIGHT TOTAL KNEE ARTHROPLASTY MEDIAL/LATERAL COMPARTMENTS WITH PATELLA RESURFACING (Right) Patient reports pain as mild and moderate.   Pt up with PT walking halls this AM.  Pain controlled.  Objective: Vital signs in last 24 hours: Temp:  [97.9 F (36.6 C)-99.9 F (37.7 C)] 98 F (36.7 C) (06/29 2774) Pulse Rate:  [61-72] 61 (06/29 0632) Resp:  [16-18] 16 (06/29 1287) BP: (106-146)/(47-68) 112/50 mmHg (06/29 0632) SpO2:  [94 %-96 %] 95 % (06/29 8676)  Intake/Output from previous day: 06/28 0701 - 06/29 0700 In: 1570 [P.O.:420; I.V.:500; Blood:650] Out: -  Intake/Output this shift:     Recent Labs  01/08/14 0530 01/09/14 0514 01/09/14 1936 01/10/14 0041  HGB 9.4* 7.4* 11.6* 10.5*    Recent Labs  01/09/14 1936 01/10/14 0041  WBC 10.8* 10.2  RBC 3.79* 3.54*  HCT 34.5* 31.7*  PLT 188 179    Recent Labs  01/08/14 0530 01/10/14 0041  NA 139 140  K 4.4 4.6  CL 102 103  CO2 26 27  BUN 18 27*  CREATININE 0.60 0.70  GLUCOSE 142* 118*  CALCIUM 8.3* 8.2*   No results found for this basename: LABPT, INR,  in the last 72 hours  Neurovascular intact Sensation intact distally Intact pulses distally Dorsiflexion/Plantar flexion intact Incision: dressing C/D/I  Assessment/Plan: 3 Days Post-Op Procedure(s) (LRB): RIGHT TOTAL KNEE ARTHROPLASTY MEDIAL/LATERAL COMPARTMENTS WITH PATELLA RESURFACING (Right) Up with therapy Discharge home with home health  Chriss Czar 01/10/2014, 8:18 AM

## 2014-01-10 NOTE — Discharge Summary (Signed)
PATIENT ID: [name]        MRN:  [id number]          DOB/AGE: 70-10-1945 / 68 y.o.    DISCHARGE SUMMARY  ADMISSION DATE:    01/07/2014 DISCHARGE DATE:   01/10/2014   ADMISSION DIAGNOSIS: OA RIGHT KNEE    DISCHARGE DIAGNOSIS:  OA RIGHT KNEE    ADDITIONAL DIAGNOSIS: Active Problems:   Osteoarthritis of right knee   Acute blood loss anemia   Depression  Past Medical History  Diagnosis Date  . Arthritis     lower back & knees  . Headache(784.0)     occasionally from a fall a month ago  . Joint pain   . Joint swelling   . Osteoarthritis   . Back pain   . Nocturia   . History of blood transfusion     no abnormal reaction noted  . Cataracts, bilateral   . Depression     takes Citalopram daily  . Insomnia     PROCEDURE: Procedure(s): RIGHT TOTAL KNEE ARTHROPLASTY MEDIAL/LATERAL COMPARTMENTS WITH PATELLA RESURFACING Right on 01/07/2014  CONSULTS: none     HISTORY:  See H&P in chart  HOSPITAL COURSE:  [name] is a 68 y.o. admitted on 01/07/2014 and found to have a diagnosis of OA RIGHT KNEE.  After appropriate laboratory studies were obtained  they were taken to the operating room on 01/07/2014 and underwent  Procedure(s): RIGHT TOTAL KNEE ARTHROPLASTY MEDIAL/LATERAL COMPARTMENTS WITH PATELLA RESURFACING  Right.   They were given perioperative antibiotics:  Anti-infectives   Start     Dose/Rate Route Frequency Ordered Stop   01/07/14 1330  ceFAZolin (ANCEF) IVPB 1 g/50 mL premix     1 g 100 mL/hr over 30 Minutes Intravenous Every 6 hours 01/07/14 1221 01/07/14 2104   01/07/14 0600  ceFAZolin (ANCEF) IVPB 2 g/50 mL premix     2 g 100 mL/hr over 30 Minutes Intravenous On call to O.R. 01/06/14 1419 01/07/14 0725    .  Tolerated the procedure well.  Transferred to nursing unit form PACU less responsive was given Narcan and response time improved.  Made adjustments to pain medications.    POD #1, allowed out of bed to a chair.  PT for ambulation and exercise  program. IV saline locked.  O2 discontionued.  POD #2, continued PT and ambulation.  Hemovac pulled.  Was given 2 units of RBC's due to low H&H.   The remainder of the hospital course was dedicated to ambulation and strengthening.   The patient was discharged on 3 Days Post-Op in  Stable condition.  Blood products given:2 units CC PRBC  DIAGNOSTIC STUDIES: Recent vital signs: Patient Vitals for the past 24 hrs:  BP Temp Temp src Pulse Resp SpO2  01/10/14 0632 112/50 mmHg 98 F (36.7 C) Oral 61 16 95 %  01/10/14 0400 - - - - 16 96 %  01/10/14 0000 - - - - 16 96 %  01/09/14 2133 146/54 mmHg 99.9 F (37.7 C) Oral - 16 96 %  01/09/14 2000 - - - - 16 96 %  01/09/14 1733 133/54 mmHg 98.5 F (36.9 C) Oral 69 16 95 %  01/09/14 1633 123/50 mmHg 98.2 F (36.8 C) Oral 72 16 95 %  01/09/14 1534 121/47 mmHg 97.9 F (36.6 C) Oral 66 16 94 %  01/09/14 1509 131/56 mmHg 98.1 F (36.7 C) Oral 71 16 95 %  01/09/14 1430 128/68 mmHg 98.1 F (36.7 C) Oral  64 16 94 %  01/09/14 1359 115/59 mmHg 99.5 F (37.5 C) Oral 68 18 95 %  01/09/14 1259 112/48 mmHg 98.2 F (36.8 C) Oral 67 16 94 %  01/09/14 1159 106/50 mmHg 98 F (36.7 C) Oral 71 16 94 %  01/09/14 1152 - - - - 16 94 %  01/09/14 1136 115/63 mmHg 98.2 F (36.8 C) Oral 64 16 94 %       Recent laboratory studies:  Recent Labs  01/08/14 0530 01/09/14 0514 01/09/14 1936 01/10/14 0041  WBC 11.3* 13.2* 10.8* 10.2  HGB 9.4* 7.4* 11.6* 10.5*  HCT 28.7* 22.5* 34.5* 31.7*  PLT 240 199 188 179    Recent Labs  01/08/14 0530 01/10/14 0041  NA 139 140  K 4.4 4.6  CL 102 103  CO2 26 27  BUN 18 27*  CREATININE 0.60 0.70  GLUCOSE 142* 118*  CALCIUM 8.3* 8.2*   Lab Results  Component Value Date   INR 1.03 12/28/2013   INR 2.4* 05/13/2008   INR 1.6* 05/12/2008     Recent Radiographic Studies :  Dg Chest 2 View  12/28/2013   CLINICAL DATA:  right knee OA preop eval  EXAM: CHEST - 2 VIEW  COMPARISON:  03/20/2006  FINDINGS: Lungs are  clear. Heart size and mediastinal contours are within normal limits. No effusion. Degenerative disc disease in the visualized upper lumbar spine.  IMPRESSION: No acute cardiopulmonary disease.   Electronically Signed   By: Arne Cleveland M.D.   On: 12/28/2013 13:21    DISCHARGE INSTRUCTIONS:   DISCHARGE MEDICATIONS:     Medication List    STOP taking these medications       meloxicam 15 MG tablet  Commonly known as:  MOBIC      TAKE these medications       Biotin 5000 MCG Caps  Take 5,000 mcg by mouth 2 (two) times daily.     cholecalciferol 1000 UNITS tablet  Commonly known as:  VITAMIN D  Take 1,000 Units by mouth daily.     citalopram 10 MG tablet  Commonly known as:  CELEXA  Take 10 mg by mouth daily.     fish oil-omega-3 fatty acids 1000 MG capsule  Take 2 g by mouth daily.     GLUCOSAMINE CHONDR COMPLEX PO  Take 2 tablets by mouth daily.     multivitamin per tablet  Take 1 tablet by mouth daily.     oxyCODONE-acetaminophen 5-325 MG per tablet  Commonly known as:  ROXICET  Take 1-2 tablets by mouth every 4 (four) hours as needed for severe pain.     rivaroxaban 10 MG Tabs tablet  Commonly known as:  XARELTO  Take 1 tablet (10 mg total) by mouth daily.     vitamin E 100 UNIT capsule  Take 100 Units by mouth daily.        FOLLOW UP VISIT:       Follow-up Information   Follow up with CAFFREY JR,W D, MD. Schedule an appointment as soon as possible for a visit in 2 weeks.   Specialty:  Orthopedic Surgery   Contact information:   [place] [address] [phone]       Follow up with [facility]. (Someone from [facility] will contact you concerning start date and time for physical therapy.)    Contact information:   [address] [place] [address] [phone]  DISPOSITION:   Home  CONDITION:  Stable   [place], Joshua 01/10/2014, 8:15 AM

## 2014-01-10 NOTE — Progress Notes (Signed)
Patient d/c to home, IV, prescriptions given, instructions reviewed.

## 2014-01-10 NOTE — Progress Notes (Signed)
Physical Therapy Treatment Patient Details Name: [name] MRN: [id number] DOB: [birth year] Today's Date: 01/10/2014    History of Present Illness 68 y.o. pt s/p R TKA on 01/07/14. PMHx of L TKA, arthritis, lumbar fusion, and L rotator cuff repair.    PT Comments    Patient making good progress with ambulation and able to complete stair training this morning. Husband will be assisting at home and is very helpful. Patient planning to DC home today. Will not need another session of if planning to DC this morning  Follow Up Recommendations  Home health PT     Equipment Recommendations  Rolling walker with 5" wheels    Recommendations for Other Services       Precautions / Restrictions Precautions Precautions: Knee Restrictions RLE Weight Bearing: Weight bearing as tolerated    Mobility  Bed Mobility Overal bed mobility: Modified Independent                Transfers Overall transfer level: Modified independent                  Ambulation/Gait Ambulation/Gait assistance: Supervision Ambulation Distance (Feet): 250 Feet Assistive device: Rolling walker (2 wheeled) Gait Pattern/deviations: Step-through pattern;Decreased stride length     General Gait Details: Patient able to walk without use of KI with no evidence of buckling.    Stairs Stairs: Yes Stairs assistance: Min assist Stair Management: Step to pattern;Forwards;One rail Right Number of Stairs: 3 General stair comments: Min A for HHA on L (will have assist from husband). Cues for sequency and technique  Wheelchair Mobility    Modified Rankin (Stroke Patients Only)       Balance                                    Cognition Arousal/Alertness: Awake/alert Behavior During Therapy: WFL for tasks assessed/performed Overall Cognitive Status: Within Functional Limits for tasks assessed                      Exercises Total Joint Exercises Quad Sets:  AROM;Strengthening;Right;10 reps;Supine Heel Slides: AAROM;Right;10 reps Hip ABduction/ADduction: AROM;10 reps;Right Straight Leg Raises: AAROM;Right;10 reps Long Arc Quad: AAROM;Right;5 reps    General Comments        Pertinent Vitals/Pain 8/10 R knee pain at end of session. RN student was aware    Home Living                      Prior Function            PT Goals (current goals can now be found in the care plan section) Progress towards PT goals: Progressing toward goals    Frequency  7X/week    PT Plan Current plan remains appropriate    Co-evaluation             End of Session Equipment Utilized During Treatment: Gait belt Activity Tolerance: Patient tolerated treatment well Patient left: in chair;with call bell/phone within reach     Time: 0803-0830 PT Time Calculation (min): 27 min  Charges:  $Gait Training: 8-22 mins $Therapeutic Exercise: 8-22 mins                    G Codes:      Jacqualyn Posey 01/10/2014, 8:33 AM 01/10/2014 Jacqualyn Posey PTA [phone] pager [phone] office

## 2014-01-11 ENCOUNTER — Encounter (HOSPITAL_COMMUNITY): Payer: Self-pay | Admitting: Orthopedic Surgery

## 2014-03-08 ENCOUNTER — Ambulatory Visit (INDEPENDENT_AMBULATORY_CARE_PROVIDER_SITE_OTHER): Payer: Medicare Other | Admitting: Family Medicine

## 2014-03-08 ENCOUNTER — Ambulatory Visit (INDEPENDENT_AMBULATORY_CARE_PROVIDER_SITE_OTHER): Payer: Medicare Other

## 2014-03-08 VITALS — BP 146/54 | HR 59 | Temp 98.3°F | Resp 16 | Ht 59.5 in | Wt 108.2 lb

## 2014-03-08 DIAGNOSIS — M25429 Effusion, unspecified elbow: Secondary | ICD-10-CM

## 2014-03-08 DIAGNOSIS — M7020 Olecranon bursitis, unspecified elbow: Secondary | ICD-10-CM

## 2014-03-08 DIAGNOSIS — M25421 Effusion, right elbow: Secondary | ICD-10-CM

## 2014-03-08 DIAGNOSIS — M7021 Olecranon bursitis, right elbow: Secondary | ICD-10-CM

## 2014-03-08 NOTE — Progress Notes (Signed)
Chief Complaint:  Chief Complaint  Patient presents with  . Elbow Injury    Rt Elbow swollen - fell in the bathroom 1 month ago    HPI: [name] is a 68 y.o. female who is here for  Right elbow mass for the last 2 months. She noticed it after she fell. She fell and hit her head and also her elbow in the bathtub. She denies LOC, she has not had any concussive sxs. She has no fevers, chills, warmth, redness, or pain around her elbow joint. She has not tried anything for the swelling. She is doing well overall. No LOC or HA after fall.   Past Medical History  Diagnosis Date  . Arthritis     lower back & knees  . Headache(784.0)     occasionally from a fall a month ago  . Joint pain   . Joint swelling   . Osteoarthritis   . Back pain   . Nocturia   . History of blood transfusion     no abnormal reaction noted  . Cataracts, bilateral   . Depression     takes Citalopram daily  . Insomnia    Past Surgical History  Procedure Laterality Date  . Mass removed from left ring finger  2013  . Mass excision  twice: 08/28/2011; 2012    Procedure: EXCISION MASS;  Surgeon: Wynonia Sours, MD;  Location: [place];  Service: Orthopedics;  Laterality: Left;  excision mass left ring finger  . Mass excision  02/11/2012    Procedure: EXCISION MASS;  Surgeon: Wynonia Sours, MD;  Location: [place];  Service: Orthopedics;  Laterality: Left;  EXCISION MUCOID CYST, DEBRIDEMENT INTERPHALANGEAL JOINT LEFT THUMB  . Joint replacement  2009    left knee replacement  . Back surgery  2007    lumbar fusion w/ rods & screws- lower back  . Cyst excision  2005    lumbar cyst  . Shoulder arthroscopy with rotator cuff repair and subacromial decompression Left 07/26/2013    Procedure: LEFT SHOULDER ARTHROSCOPY WITH /SUBACROMIAL DECOMPRESSION AND DISTAL CLAVICLE RESECTION;  Surgeon: Yvette Rack., MD;  Location: [place];  Service: Orthopedics;   Laterality: Left;  . Colonoscopy    . Retinal detachment surgery Right   . Total knee arthroplasty Right 01/07/2014    Procedure: RIGHT TOTAL KNEE ARTHROPLASTY MEDIAL/LATERAL COMPARTMENTS WITH PATELLA RESURFACING;  Surgeon: Yvette Rack., MD;  Location: [place];  Service: Orthopedics;  Laterality: Right;   History   Social History  . Marital Status: Married    Spouse Name: N/A    Number of Children: N/A  . Years of Education: N/A   Social History Main Topics  . Smoking status: Never Smoker   . Smokeless tobacco: None  . Alcohol Use: No  . Drug Use: No  . Sexual Activity: Yes    Birth Control/ Protection: Post-menopausal   Other Topics Concern  . None   Social History Narrative  . None   Family History  Problem Relation Age of Onset  . Cancer Mother   . Alcohol abuse Father    Allergies  Allergen Reactions  . Sulfa Antibiotics Hives   Prior to Admission medications   Medication Sig Start Date End Date Taking? Authorizing Provider  Biotin 5000 MCG CAPS Take 5,000 mcg by mouth 2 (two) times daily.    Yes Historical Provider, MD  cholecalciferol (VITAMIN D) 1000 UNITS  tablet Take 1,000 Units by mouth daily.   Yes Historical Provider, MD  citalopram (CELEXA) 10 MG tablet Take 10 mg by mouth daily.   Yes Historical Provider, MD  fish oil-omega-3 fatty acids 1000 MG capsule Take 2 g by mouth daily.   Yes Historical Provider, MD  Glucosamine-Chondroitin (GLUCOSAMINE CHONDR COMPLEX PO) Take 2 tablets by mouth daily.   Yes Historical Provider, MD  multivitamin [facility]) per tablet Take 1 tablet by mouth daily.   Yes Historical Provider, MD  oxyCODONE-acetaminophen (ROXICET) 5-325 MG per tablet Take 1-2 tablets by mouth every 4 (four) hours as needed for severe pain. 01/07/14  Yes Joshua Chadwell, PA-C  vitamin E 100 UNIT capsule Take 100 Units by mouth daily.   Yes Historical Provider, MD     ROS: The patient denies fevers, chills, night sweats, unintentional weight loss,  chest pain, palpitations, wheezing, dyspnea on exertion, nausea, vomiting, abdominal pain, dysuria, hematuria, melena, numbness, weakness, or tingling.   All other systems have been reviewed and were otherwise negative with the exception of those mentioned in the HPI and as above.    PHYSICAL EXAM: Filed Vitals:   03/08/14 0933  BP: 146/54  Pulse: 59  Temp: 98.3 F (36.8 C)  Resp: 16   Filed Vitals:   03/08/14 0933  Height: 4' 11.5" (1.511 m)  Weight: 108 lb 3.2 oz (49.079 kg)   Body mass index is 21.5 kg/(m^2).  General: Alert, no acute distress HEENT:  Normocephalic, atraumatic, oropharynx patent. EOMI, PERRLA Cardiovascular:  Regular rate and rhythm, no rubs murmurs or gallops.  No Carotid bruits, radial pulse intact. No pedal edema.  Respiratory: Clear to auscultation bilaterally.  No wheezes, rales, or rhonchi.  No cyanosis, no use of accessory musculature GI: No organomegaly, abdomen is soft and non-tender, positive bowel sounds.  No masses. Skin: No rashes. Neurologic: Facial musculature symmetric. Psychiatric: Patient is appropriate throughout our interaction. Lymphatic: No cervical lymphadenopathy Musculoskeletal: Gait intact. RIght elbow effusion , nontender, no warmth, at olecranon Full ROM, 5/5 strength, 2/2 DTRs, sensation intact   LABS: Results for orders placed during the hospital encounter of 01/07/14  CBC      Result Value Ref Range   WBC 11.3 (*) 4.0 - 10.5 K/uL   RBC 3.14 (*) 3.87 - 5.11 MIL/uL   Hemoglobin 9.4 (*) 12.0 - 15.0 g/dL   HCT 28.7 (*) 36.0 - 46.0 %   MCV 91.4  78.0 - 100.0 fL   MCH 29.9  26.0 - 34.0 pg   MCHC 32.8  30.0 - 36.0 g/dL   RDW 12.9  11.5 - 15.5 %   Platelets 240  150 - 400 K/uL  BASIC METABOLIC PANEL      Result Value Ref Range   Sodium 139  137 - 147 mEq/L   Potassium 4.4  3.7 - 5.3 mEq/L   Chloride 102  96 - 112 mEq/L   CO2 26  19 - 32 mEq/L   Glucose, Bld 142 (*) 70 - 99 mg/dL   BUN 18  6 - 23 mg/dL   Creatinine, Ser  0.60  0.50 - 1.10 mg/dL   Calcium 8.3 (*) 8.4 - 10.5 mg/dL   GFR calc non Af Amer >90  >90 mL/min   GFR calc Af Amer >90  >90 mL/min  CBC      Result Value Ref Range   WBC 13.2 (*) 4.0 - 10.5 K/uL   RBC 2.44 (*) 3.87 - 5.11 MIL/uL   Hemoglobin 7.4 (*)  12.0 - 15.0 g/dL   HCT 22.5 (*) 36.0 - 46.0 %   MCV 92.2  78.0 - 100.0 fL   MCH 30.3  26.0 - 34.0 pg   MCHC 32.9  30.0 - 36.0 g/dL   RDW 13.3  11.5 - 15.5 %   Platelets 199  150 - 400 K/uL  BASIC METABOLIC PANEL      Result Value Ref Range   Sodium 140  137 - 147 mEq/L   Potassium 4.6  3.7 - 5.3 mEq/L   Chloride 103  96 - 112 mEq/L   CO2 27  19 - 32 mEq/L   Glucose, Bld 118 (*) 70 - 99 mg/dL   BUN 27 (*) 6 - 23 mg/dL   Creatinine, Ser 0.70  0.50 - 1.10 mg/dL   Calcium 8.2 (*) 8.4 - 10.5 mg/dL   GFR calc non Af Amer 86 (*) >90 mL/min   GFR calc Af Amer >90  >90 mL/min  CBC      Result Value Ref Range   WBC 10.2  4.0 - 10.5 K/uL   RBC 3.54 (*) 3.87 - 5.11 MIL/uL   Hemoglobin 10.5 (*) 12.0 - 15.0 g/dL   HCT 31.7 (*) 36.0 - 46.0 %   MCV 89.5  78.0 - 100.0 fL   MCH 29.7  26.0 - 34.0 pg   MCHC 33.1  30.0 - 36.0 g/dL   RDW 14.4  11.5 - 15.5 %   Platelets 179  150 - 400 K/uL  CBC      Result Value Ref Range   WBC 10.8 (*) 4.0 - 10.5 K/uL   RBC 3.79 (*) 3.87 - 5.11 MIL/uL   Hemoglobin 11.6 (*) 12.0 - 15.0 g/dL   HCT 34.5 (*) 36.0 - 46.0 %   MCV 91.0  78.0 - 100.0 fL   MCH 30.6  26.0 - 34.0 pg   MCHC 33.6  30.0 - 36.0 g/dL   RDW 14.1  11.5 - 15.5 %   Platelets 188  150 - 400 K/uL  PREPARE RBC (CROSSMATCH)      Result Value Ref Range   Order Confirmation ORDER PROCESSED BY BLOOD BANK       EKG/XRAY:   Primary read interpreted by Dr. Marin Comment at [facility] Neg for fracture or dislocation She has mild olecranon effusion   ASSESSMENT/PLAN: Encounter Diagnoses  Name Primary?  . Elbow swelling, right Yes  . Olecranon bursitis of right elbow    No skin erythema or wound noted. Risks (including but not limited to bleeding and  infection), benefits, and alternatives discussed for R elbow aspiration. Verbal consent obtained after any questions were answered., and verbal understanding expressed. Landmarks noted, and marked as needed. Area cleansed with Betadine x3, ethyl chloride spray for topical anesthesia, followed by alcohol swab. 18 gauge needle used. Patient had 10 cc of blood removed. No pus.  Precautions given for infection F/prn      Gross sideeffects, risk and benefits, and alternatives of medications d/w patient. Patient is aware that all medications have potential sideeffects and we are unable to predict every sideeffect or drug-drug interaction that may occur.  Brelyn Woehl, [place], DO 03/08/2014 11:38 AM

## 2014-03-08 NOTE — Patient Instructions (Signed)
Olecranon Bursitis Bursitis is swelling and soreness (inflammation) of a fluid-filled sac (bursa) that covers and protects a joint. Olecranon bursitis occurs over the elbow.  CAUSES Bursitis can be caused by injury, overuse of the joint, arthritis, or infection.  SYMPTOMS   Tenderness, swelling, warmth, or redness over the elbow.  Elbow pain with movement. This is greater with bending the elbow.  Squeaking sound when the bursa is rubbed or moved.  Increasing size of the bursa without pain or discomfort.  Fever with increasing pain and swelling if the bursa becomes infected. HOME CARE INSTRUCTIONS   Put ice on the affected area.  Put ice in a plastic bag.  Place a towel between your skin and the bag.  Leave the ice on for 15-20 minutes each hour while awake. Do this for the first 2 days.  When resting, elevate your elbow above the level of your heart. This helps reduce swelling.  Continue to put the joint through a full range of motion 4 times per day. Rest the injured joint at other times. When the pain lessens, begin normal slow movements and usual activities.  Only take over-the-counter or prescription medicines for pain, discomfort, or fever as directed by your caregiver.  Reduce your intake of milk and related dairy products (cheese, yogurt). They may make your condition worse. SEEK IMMEDIATE MEDICAL CARE IF:   Your pain increases even during treatment.  You have a fever.  You have heat and inflammation over the bursa and elbow.  You have a red line that goes up your arm.  You have pain with movement of your elbow. MAKE SURE YOU:   Understand these instructions.  Will watch your condition.  Will get help right away if you are not doing well or get worse. Document Released: 07/31/2006 Document Revised: 09/23/2011 Document Reviewed: 06/16/2007 ExitCare Patient Information 2015 [employer]. This information is not intended to replace advice given to you by your  health care provider. Make sure you discuss any questions you have with your health care provider.  

## 2014-05-24 ENCOUNTER — Other Ambulatory Visit: Payer: Self-pay | Admitting: Family Medicine

## 2014-05-24 DIAGNOSIS — R519 Headache, unspecified: Secondary | ICD-10-CM

## 2014-05-24 DIAGNOSIS — R51 Headache: Principal | ICD-10-CM

## 2014-05-29 ENCOUNTER — Ambulatory Visit
Admission: RE | Admit: 2014-05-29 | Discharge: 2014-05-29 | Disposition: A | Discharge status: Home / Self Care | Payer: Medicare Other | Source: Ambulatory Visit | Attending: Family Medicine | Admitting: Family Medicine

## 2014-05-29 DIAGNOSIS — R519 Headache, unspecified: Secondary | ICD-10-CM

## 2014-05-29 DIAGNOSIS — R51 Headache: Principal | ICD-10-CM

## 2014-06-15 ENCOUNTER — Ambulatory Visit (INDEPENDENT_AMBULATORY_CARE_PROVIDER_SITE_OTHER): Payer: Medicare Other | Admitting: Neurology

## 2014-06-15 ENCOUNTER — Encounter: Payer: Self-pay | Admitting: Neurology

## 2014-06-15 VITALS — BP 130/58 | HR 66 | Temp 98.3°F | Resp 18 | Ht 60.0 in | Wt 108.6 lb

## 2014-06-15 DIAGNOSIS — I6200 Nontraumatic subdural hemorrhage, unspecified: Secondary | ICD-10-CM

## 2014-06-15 DIAGNOSIS — F0781 Postconcussional syndrome: Secondary | ICD-10-CM

## 2014-06-15 DIAGNOSIS — S065X9A Traumatic subdural hemorrhage with loss of consciousness of unspecified duration, initial encounter: Secondary | ICD-10-CM

## 2014-06-15 DIAGNOSIS — R413 Other amnesia: Secondary | ICD-10-CM

## 2014-06-15 DIAGNOSIS — G4440 Drug-induced headache, not elsewhere classified, not intractable: Secondary | ICD-10-CM

## 2014-06-15 DIAGNOSIS — F329 Major depressive disorder, single episode, unspecified: Secondary | ICD-10-CM

## 2014-06-15 DIAGNOSIS — F32A Depression, unspecified: Secondary | ICD-10-CM

## 2014-06-15 DIAGNOSIS — G44309 Post-traumatic headache, unspecified, not intractable: Secondary | ICD-10-CM

## 2014-06-15 DIAGNOSIS — S065XAA Traumatic subdural hemorrhage with loss of consciousness status unknown, initial encounter: Secondary | ICD-10-CM

## 2014-06-15 DIAGNOSIS — G250 Essential tremor: Secondary | ICD-10-CM

## 2014-06-15 DIAGNOSIS — G4441 Drug-induced headache, not elsewhere classified, intractable: Secondary | ICD-10-CM

## 2014-06-15 MED ORDER — DICLOFENAC SODIUM 75 MG PO TBEC: 75.0000 mg | DELAYED_RELEASE_TABLET | Freq: Two times a day (BID) | ORAL | Status: DC | PRN

## 2014-06-15 NOTE — Patient Instructions (Signed)
1.  Start nortriptyline 10mg  at bedtime.  Side effects include sleepiness, dizziness and dry mouth. 2.  Stop citalopram 3.  Stop tramadol 4.  When you get headache, take diclofenac 75mg .  Take no more than twice a day and no more than 2 days out of the week to prevent rebound headache 5.  Follow up in 6 weeks.

## 2014-06-15 NOTE — Progress Notes (Signed)
NEUROLOGY CONSULTATION NOTE  [name] MRN: [id number] DOB: 11/27/1945  Referring provider: Dr. Kenton Kingfisher Primary care provider: Dr. Kenton Kingfisher  Reason for consult:  Headache  HISTORY OF PRESENT ILLNESS: [name] is a 68 year old left-handed woman with depression, right sided hearing loss, arthritis, tremor and history of knee and shoulder surgery who presents for headache.  Records and MRI of the brain personally reviewed.  She is accompanied by her husband who provides some history.  At the end of September, she slipped and fell out of the bathtub and hit the back of her head.  She did not lose consciousness but felt woozy for a couple of days.  About 1-2 weeks later, she developed a headache.  It was initially bi-frontal, then moved to the back of the head as well.  It is a non-throbbing ache, about 8/10 intensity.  It lasts anywhere from 1/2 a day to all day.  There are no associated symptoms such as nausea or photophobia.  She has chronic phono-sensitivity due to her hearing aid.  She denies dizziness or focal numbness or weakness.  She has not had problems with balance or gait.  She feels a little more "edgy".  She was taking citalopram but reduced it to 1/2 pill twice a week because it didn't seem to help.  At first, she was taking Tylenol, Advil and Aleve, which was ineffective.  Last month, she was given tramadol, which helps.  She takes in 4-5 days out of the week.  She had an MRI of the brain without contrast performed on 05/29/14, which showed small 1-2 mm late subacute or chronic right-sided subdural hematoma without mass effect.  She has no prior history of headache.  She also reports memory problems for the past several months, but worse over the past couple of months.  It is short-term memory problems.  She will forget details about a conversation she had the previous day.  She denies repeating questions, misplacing objects or disorientation while driving.  There is no known  family history of dementia.  PAST MEDICAL HISTORY: Past Medical History  Diagnosis Date  . Arthritis     lower back & knees  . Headache(784.0)     occasionally from a fall a month ago  . Joint pain   . Joint swelling   . Osteoarthritis   . Back pain   . Nocturia   . History of blood transfusion     no abnormal reaction noted  . Cataracts, bilateral   . Depression     takes Citalopram daily  . Insomnia     PAST SURGICAL HISTORY: Past Surgical History  Procedure Laterality Date  . Mass removed from left ring finger  2013  . Mass excision  twice: 08/28/2011; 2012    Procedure: EXCISION MASS;  Surgeon: Wynonia Sours, MD;  Location: [place];  Service: Orthopedics;  Laterality: Left;  excision mass left ring finger  . Mass excision  02/11/2012    Procedure: EXCISION MASS;  Surgeon: Wynonia Sours, MD;  Location: [place];  Service: Orthopedics;  Laterality: Left;  EXCISION MUCOID CYST, DEBRIDEMENT INTERPHALANGEAL JOINT LEFT THUMB  . Joint replacement  2009    left knee replacement  . Back surgery  2007    lumbar fusion w/ rods & screws- lower back  . Cyst excision  2005    lumbar cyst  . Shoulder arthroscopy with rotator cuff repair and subacromial decompression Left 07/26/2013  Procedure: LEFT SHOULDER ARTHROSCOPY WITH /SUBACROMIAL DECOMPRESSION AND DISTAL CLAVICLE RESECTION;  Surgeon: Yvette Rack., MD;  Location: [place];  Service: Orthopedics;  Laterality: Left;  . Colonoscopy    . Retinal detachment surgery Right   . Total knee arthroplasty Right 01/07/2014    Procedure: RIGHT TOTAL KNEE ARTHROPLASTY MEDIAL/LATERAL COMPARTMENTS WITH PATELLA RESURFACING;  Surgeon: Yvette Rack., MD;  Location: [place];  Service: Orthopedics;  Laterality: Right;    MEDICATIONS: Current Outpatient Prescriptions on File Prior to Visit  Medication Sig Dispense Refill  . Biotin 5000 MCG CAPS Take 5,000 mcg by mouth 2 (two) times daily.     .  cholecalciferol (VITAMIN D) 1000 UNITS tablet Take 1,000 Units by mouth daily.    . citalopram (CELEXA) 10 MG tablet Take 10 mg by mouth daily.    . fish oil-omega-3 fatty acids 1000 MG capsule Take 2 g by mouth daily.    . Glucosamine-Chondroitin (GLUCOSAMINE CHONDR COMPLEX PO) Take 2 tablets by mouth daily.    . multivitamin (THERAGRAN) per tablet Take 1 tablet by mouth daily.    . vitamin E 100 UNIT capsule Take 100 Units by mouth daily.    Marland Kitchen oxyCODONE-acetaminophen (ROXICET) 5-325 MG per tablet Take 1-2 tablets by mouth every 4 (four) hours as needed for severe pain. (Patient not taking: Reported on 06/15/2014) 100 tablet 0   No current facility-administered medications on file prior to visit.    ALLERGIES: Allergies  Allergen Reactions  . Sulfa Antibiotics Hives    FAMILY HISTORY: Family History  Problem Relation Age of Onset  . Cancer Mother     pancretic   . Alcohol abuse Father     SOCIAL HISTORY: History   Social History  . Marital Status: Married    Spouse Name: N/A    Number of Children: N/A  . Years of Education: N/A   Occupational History  . Not on file.   Social History Main Topics  . Smoking status: Never Smoker   . Smokeless tobacco: Not on file  . Alcohol Use: No  . Drug Use: No  . Sexual Activity:    Partners: Male    Birth Control/ Protection: Post-menopausal   Other Topics Concern  . Not on file   Social History Narrative    REVIEW OF SYSTEMS: Constitutional: No fevers, chills, or sweats, no generalized fatigue, change in appetite Eyes: No visual changes, double vision, eye pain Ear, nose and throat: No hearing loss, ear pain, nasal congestion, sore throat Cardiovascular: No chest pain, palpitations Respiratory:  No shortness of breath at rest or with exertion, wheezes GastrointestinaI: No nausea, vomiting, diarrhea, abdominal pain, fecal incontinence Genitourinary:  No dysuria, urinary retention or frequency Musculoskeletal:  No neck  pain, back pain Integumentary: No rash, pruritus, skin lesions Neurological: as above Psychiatric: No depression, insomnia, anxiety Endocrine: No palpitations, fatigue, diaphoresis, mood swings, change in appetite, change in weight, increased thirst Hematologic/Lymphatic:  No anemia, purpura, petechiae. Allergic/Immunologic: no itchy/runny eyes, nasal congestion, recent allergic reactions, rashes  PHYSICAL EXAM: Filed Vitals:   06/15/14 0814  BP: 130/58  Pulse: 66  Temp: 98.3 F (36.8 C)  Resp: 18   General: No acute distress Head:  Normocephalic/atraumatic Eyes:  fundi unremarkable, without vessel changes, exudates, hemorrhages or papilledema. Neck: supple, no paraspinal tenderness, full range of motion Back: No paraspinal tenderness Heart: regular rate and rhythm Lungs: Clear to auscultation bilaterally. Vascular: No carotid bruits. Neurological Exam: Mental status: alert and oriented to  person, place, and time, recent and remote memory intact, fund of knowledge intact, attention and concentration intact, speech fluent and not dysarthric, language intact. MMSE - Mini Mental State Exam 06/15/2014  Orientation to time 5  Orientation to Place 5  Registration 3  Attention/ Calculation 5  Recall 2  Language- name 2 objects 2  Language- repeat 1  Language- follow 3 step command 3  Language- read & follow direction 1  Write a sentence 1  Copy design 1  Total score 29   Cranial nerves: CN I: not tested CN II: pupils equal, round and reactive to light, visual fields intact, fundi unremarkable, without vessel changes, exudates, hemorrhages or papilledema. CN III, IV, VI:  full range of motion, no nystagmus, no ptosis CN V: facial sensation intact CN VII: upper and lower face symmetric CN VIII: reduced hearing on the right. CN IX, X: gag intact, uvula midline CN XI: sternocleidomastoid and trapezius muscles intact CN XII: tongue midline Bulk & Tone: normal, no  fasciculations. Motor:  5/5 throughout Sensation:  Pinprick and vibration intact Deep Tendon Reflexes:  3+ throughout, toes downgoing. Finger to nose testing:  intention tremor biltaterally Gait:  Normal station and stride.  Able to turn and walk in tandem. Romberg negative.  IMPRESSION: Post-traumatic headache Medication-overuse headache Postconcussion syndrome Subdural hematoma, very small and resolving Memory problems, likely increased due to postconcussion syndrome Essential tremor, stable depression  PLAN: 1.  Start nortriptyline 10mg  at bedtime.  Side effects include sleepiness, dizziness and dry mouth. 2.  Stop citalopram 3.  Stop tramadol 4.  When you get headache, take diclofenac 75mg .  Take no more than twice a day and no more than 2 days out of the week to prevent rebound headache 5.  Follow up in 6 weeks.  Thank you for allowing me to take part in the care of this patient.  Metta Clines, DO  CC: Shirline Frees, MD

## 2014-06-29 ENCOUNTER — Telehealth: Payer: Self-pay | Admitting: *Deleted

## 2014-06-29 NOTE — Telephone Encounter (Signed)
nortriptyline 10mg  at bedtime.#30 with 2 refills called to pharmacy

## 2014-07-27 ENCOUNTER — Ambulatory Visit: Payer: Medicare Other | Admitting: Neurology

## 2014-08-01 ENCOUNTER — Ambulatory Visit: Payer: Medicare Other | Admitting: Neurology

## 2014-08-01 DIAGNOSIS — Z029 Encounter for administrative examinations, unspecified: Secondary | ICD-10-CM

## 2014-08-02 ENCOUNTER — Telehealth: Payer: Self-pay | Admitting: Neurology

## 2014-08-02 NOTE — Telephone Encounter (Signed)
Pt no showed 08/01/13 appt w/ Dr. Tomi Likens. No show letter mailed to pt / Sherri S.

## 2014-08-09 ENCOUNTER — Telehealth: Payer: Self-pay | Admitting: Neurology

## 2014-08-09 NOTE — Telephone Encounter (Signed)
08/09/14-spoke w/ pt's husband, he says he did call to cancel the 08/01/14 appt with our office. He was charged a no show fee. We will allow a courtesy write off of this no show fee for 2016. Emailed charge correction to delete charge / [facility]

## 2015-04-05 ENCOUNTER — Ambulatory Visit (INDEPENDENT_AMBULATORY_CARE_PROVIDER_SITE_OTHER): Payer: Medicare (Managed Care) | Admitting: Family Medicine

## 2015-04-05 ENCOUNTER — Encounter (INDEPENDENT_AMBULATORY_CARE_PROVIDER_SITE_OTHER): Payer: Self-pay

## 2015-04-05 VITALS — BP 155/69 | HR 80 | Temp 98.5°F | Resp 12 | Wt 108.0 lb

## 2015-04-05 DIAGNOSIS — Z4802 Encounter for removal of sutures: Secondary | ICD-10-CM

## 2015-04-05 NOTE — Patient Instructions (Signed)
Laceration, Hand (All Closures)  A laceration is a cut through the skin. This will usually require stitches (sutures) or staples if it is deep. Minor cuts may be treated with surgical tape closures or skin adhesive.    Home care  The following guidelines will help you care for your laceration at home:   If a bandage was applied and it becomes wet or dirty, replace it. Otherwise, leave it in place for the first 24 hours, then change it once a day or as directed.   If stitches or staples were used, clean the wound daily. Each day, look at the wound for any of the warning signs listed below.   After removing the bandage, wash the area with soap and water. Use a wet cotton swab to loosen and remove any blood or crust that forms.   Talk with your doctor before applying any antibiotic ointment to the wound. Reapply the bandage.   You may remove the bandage and shower as usual after the first 24 hours, but do not soak the area in water (no swimming) until the stitches or staples are removed.   If surgical tape was used, keep the area clean and dry. If it becomes wet, blot it dry with a towel. It will usually fall off within 7-10 days.   If skin adhesive was used, do not scratch, rub, or pick at the adhesive film. Do not place tape directly over the film. Do not apply liquid, ointment or creams to the wound while the film is in place. This means do not clean the wound with peroxide and do not apply antibiotic ointment. Avoid activities that cause heavy sweating until the film has fallen off. Protect the wound from prolonged exposure to sunlight or tanning lamps. You may shower as usual but do not soak the wound in water (no baths or swimming).   The doctor may prescribe an antibiotic cream or ointment to prevent infection. Do not stop taking this medication until you have finished the prescribed course or the doctor tells you to stop.The doctor may also prescribe medications for pain. Follow the doctor's  instructions for taking these medications. If you have chronic liver or kidney disease or ever had a stomach ulcer or GI bleeding, talk with your doctor before using these medicines.  Follow-up care  Follow up with your health care provider. Most skin wounds heal within ten days. However, an infection may sometimes occur despite proper treatment. Therefore, check the wound daily for the warning signs listed below. Stitches and staples should be removed within 7-14 days. If tape closures were used, you may remove them yourself if they have not fallen off after 10 days. If skin glue was used, the film will fall off by itself in 5-10 days. Notify your doctor if you notice persistent numbness or weakness in the injured extremity.  When to seek medical advice  Call your health care provider right awayif any of these occur:   Increasing pain in the wound   Redness, swelling or pus coming from the wound   Fever of 100.4F (38C) or higher, or as directed by your health care provider   If stitches or staples come apart or fall out before your next appointment   If the surgical tape falls off within seven days, or the wound edges re-open   Bleeding not controlled by direct pressure   2000-2015 [employer] [address], [place] [address]. All rights reserved. This information is   not intended as a substitute for professional medical care. Always follow your healthcare professional's instructions.

## 2015-04-05 NOTE — Progress Notes (Signed)
Chief Complaint   Patient presents with   . Suture / Staple Removal     Suture removal - placed in forehead 04/01/2015 at [facility]       Subjective:      Donna Luna is a 69 y.o. female who obtained a laceration 4 days ago, which required closure with a few sutures. Mechanism of injury: fall. She denies pain, redness, or drainage from the wound. Her last tetanus was uptodate . ago.    The following portions of the patient's history were reviewed and updated as appropriate: allergies, current medications, past family history, past medical history, past social history, past surgical history and problem list.    Review of Systems  Pertinent items are noted in HPI.      Objective:      BP 155/69 mmHg  Pulse 80  Temp(Src) 98.5 F (36.9 C) (Tympanic)  Resp 12  Wt 48.988 kg (108 lb)  Injury exam:  A 2.5 cm laceration noted on the forehead is healing well, without evidence of infection.      Assessment:      Laceration is healing well, without evidence of infection.      Plan:        1. 6 sutures were removed. Applied steri strips and band aid  2. Wound care discussed.  3. Follow up as needed.

## 2015-05-30 LAB — GLUCOSE, POCT (MANUAL RESULT ENTRY): POC Glucose: 110 mg/dl — AB (ref 70–99)

## 2015-08-23 DIAGNOSIS — J209 Acute bronchitis, unspecified: Secondary | ICD-10-CM | POA: Diagnosis not present

## 2015-09-05 ENCOUNTER — Other Ambulatory Visit: Payer: Self-pay | Admitting: Family Medicine

## 2015-09-05 ENCOUNTER — Ambulatory Visit
Admission: RE | Admit: 2015-09-05 | Discharge: 2015-09-05 | Disposition: A | Discharge status: Home / Self Care | Payer: PPO | Source: Ambulatory Visit | Attending: Family Medicine | Admitting: Family Medicine

## 2015-09-05 DIAGNOSIS — R059 Cough, unspecified: Secondary | ICD-10-CM

## 2015-09-05 DIAGNOSIS — R05 Cough: Secondary | ICD-10-CM | POA: Diagnosis not present

## 2015-12-06 DIAGNOSIS — M25532 Pain in left wrist: Secondary | ICD-10-CM | POA: Diagnosis not present

## 2015-12-07 ENCOUNTER — Other Ambulatory Visit: Payer: Self-pay | Admitting: Family Medicine

## 2015-12-07 ENCOUNTER — Ambulatory Visit
Admission: RE | Admit: 2015-12-07 | Discharge: 2015-12-07 | Disposition: A | Discharge status: Home / Self Care | Payer: PPO | Source: Ambulatory Visit | Attending: Family Medicine | Admitting: Family Medicine

## 2015-12-07 DIAGNOSIS — M25532 Pain in left wrist: Secondary | ICD-10-CM | POA: Diagnosis not present

## 2015-12-07 DIAGNOSIS — M7989 Other specified soft tissue disorders: Secondary | ICD-10-CM | POA: Diagnosis not present

## 2016-02-16 DIAGNOSIS — R413 Other amnesia: Secondary | ICD-10-CM | POA: Diagnosis not present

## 2016-05-01 DIAGNOSIS — F419 Anxiety disorder, unspecified: Secondary | ICD-10-CM | POA: Diagnosis not present

## 2016-05-01 DIAGNOSIS — R413 Other amnesia: Secondary | ICD-10-CM | POA: Diagnosis not present

## 2016-05-01 DIAGNOSIS — Z23 Encounter for immunization: Secondary | ICD-10-CM | POA: Diagnosis not present

## 2016-12-03 DIAGNOSIS — F324 Major depressive disorder, single episode, in partial remission: Secondary | ICD-10-CM | POA: Diagnosis not present

## 2016-12-03 DIAGNOSIS — M170 Bilateral primary osteoarthritis of knee: Secondary | ICD-10-CM | POA: Diagnosis not present

## 2016-12-03 DIAGNOSIS — R413 Other amnesia: Secondary | ICD-10-CM | POA: Diagnosis not present

## 2017-01-07 DIAGNOSIS — F324 Major depressive disorder, single episode, in partial remission: Secondary | ICD-10-CM | POA: Diagnosis not present

## 2017-01-07 DIAGNOSIS — R413 Other amnesia: Secondary | ICD-10-CM | POA: Diagnosis not present

## 2017-01-08 ENCOUNTER — Other Ambulatory Visit: Payer: Self-pay | Admitting: Family Medicine

## 2017-01-08 DIAGNOSIS — R413 Other amnesia: Secondary | ICD-10-CM

## 2017-01-17 ENCOUNTER — Other Ambulatory Visit: Payer: PPO

## 2017-01-31 ENCOUNTER — Ambulatory Visit
Admission: RE | Admit: 2017-01-31 | Discharge: 2017-01-31 | Disposition: A | Discharge status: Home / Self Care | Payer: PPO | Source: Ambulatory Visit | Attending: Family Medicine | Admitting: Family Medicine

## 2017-01-31 DIAGNOSIS — R413 Other amnesia: Secondary | ICD-10-CM

## 2017-01-31 DIAGNOSIS — R51 Headache: Secondary | ICD-10-CM | POA: Diagnosis not present

## 2017-01-31 DIAGNOSIS — S0990XA Unspecified injury of head, initial encounter: Secondary | ICD-10-CM | POA: Diagnosis not present

## 2017-02-10 DIAGNOSIS — R413 Other amnesia: Secondary | ICD-10-CM | POA: Diagnosis not present

## 2017-04-14 DIAGNOSIS — R413 Other amnesia: Secondary | ICD-10-CM | POA: Diagnosis not present

## 2017-04-14 DIAGNOSIS — R197 Diarrhea, unspecified: Secondary | ICD-10-CM | POA: Diagnosis not present

## 2017-04-14 DIAGNOSIS — Z23 Encounter for immunization: Secondary | ICD-10-CM | POA: Diagnosis not present

## 2017-04-14 DIAGNOSIS — R05 Cough: Secondary | ICD-10-CM | POA: Diagnosis not present

## 2017-07-24 ENCOUNTER — Encounter: Payer: Self-pay | Admitting: Neurology

## 2017-07-24 ENCOUNTER — Ambulatory Visit (INDEPENDENT_AMBULATORY_CARE_PROVIDER_SITE_OTHER): Payer: PPO | Admitting: Neurology

## 2017-07-24 ENCOUNTER — Encounter: Payer: Self-pay | Admitting: *Deleted

## 2017-07-24 ENCOUNTER — Encounter (INDEPENDENT_AMBULATORY_CARE_PROVIDER_SITE_OTHER): Payer: Self-pay

## 2017-07-24 VITALS — BP 134/74 | HR 79 | Ht 59.5 in | Wt 107.8 lb

## 2017-07-24 DIAGNOSIS — F0390 Unspecified dementia without behavioral disturbance: Secondary | ICD-10-CM | POA: Diagnosis not present

## 2017-07-24 MED ORDER — DONEPEZIL HCL 10 MG PO TABS: 10.0000 mg | ORAL_TABLET | Freq: Every day | ORAL | 11 refills | Status: DC

## 2017-07-24 MED ORDER — MEMANTINE HCL 10 MG PO TABS: 10.0000 mg | ORAL_TABLET | Freq: Two times a day (BID) | ORAL | 11 refills | Status: DC

## 2017-07-24 NOTE — Progress Notes (Signed)
PATIENT: [name] DOB: [birth year]  Chief Complaint  Patient presents with  . Memory Loss    MMSE 22/30 - 2 animals.  She is here with her husband, [name] and daughter, Warren Lacy. She is here to have her memory loss, agitation and word finding diffulty further evaluated.  Marland Kitchen PCP    Shirline Frees, MD     HISTORICAL  [name] is a 72 years old female, seen in refer by primary care physician Dr. Shirline Frees, for evaluation of memory loss, initial evaluation was on July 24, 2017.  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  She had significant decline since summer 2018, more noticeable word finding difficulties, no longer cooking, her husband has taken financial management at home, she also quit driving a few months ago,  There was no family history of dementia  She has become much less active, tends to watch TV all day long  Laboratory evaluations June 2018, hemoglobin of 14.2, creatinine 0.6, normal TSH 2.39, B12 414  MRI of the brain in July 2018::Generalized atrophy, supratentorium small vessel disease    REVIEW OF SYSTEMS: Full 14 system review of systems performed and notable only for hearing loss, diarrhea, joint pain, memory loss  ALLERGIES: Allergies  Allergen Reactions  . Bupropion Other (See Comments)    Body aches   . Melatonin Other (See Comments)    Shakes, nervous  . Sulfa Antibiotics Hives    HOME MEDICATIONS: Current Outpatient Medications  Medication Sig Dispense Refill  . Ascorbic Acid (VITAMIN  C PO) Take by mouth daily.    . cholecalciferol (VITAMIN D) 1000 UNITS tablet Take 1,000 Units by mouth daily.    . Cyanocobalamin (VITAMIN B-12 PO) Take by mouth daily.    . fish oil-omega-3 fatty acids 1000 MG capsule Take 2 g by mouth daily.    . Ginkgo Biloba (GINKOBA PO) Take by mouth daily.    . Glucosamine-Chondroitin (GLUCOSAMINE CHONDR COMPLEX PO) Take 2 tablets by mouth daily.    . meloxicam (MOBIC) 15 MG tablet     . multivitamin (THERAGRAN) per tablet Take 1 tablet by mouth daily.    . vitamin E 100 UNIT capsule Take 100 Units by mouth daily.     No current facility-administered medications for this visit.     PAST MEDICAL HISTORY: Past Medical History:  Diagnosis Date  . Anxiety   . Arthritis    lower back & knees  . Back pain   . Cataracts, bilateral   . Depression    takes Citalopram daily  . Encephalitis 1973  . Headache(784.0)    occasionally from a fall a month ago  . Hearing loss   . History of blood transfusion    no abnormal reaction noted  . Hypertension   . Insomnia   . Joint pain   . Joint swelling   . Memory loss   . Nocturia   . Osteoarthritis   . Rosacea     PAST SURGICAL HISTORY: Past Surgical History:  Procedure Laterality Date  .  BACK SURGERY  2007   lumbar fusion w/ rods & screws- lower back  . COLONOSCOPY    . CYST EXCISION  2005   lumbar cyst  . JOINT REPLACEMENT  2009   left knee replacement  . MASS EXCISION  twice: 08/28/2011; 2012   Procedure: EXCISION MASS;  Surgeon: Wynonia Sours, MD;  Location: [place];  Service: Orthopedics;  Laterality: Left;  excision mass left ring finger  . MASS EXCISION  02/11/2012   Procedure: EXCISION MASS;  Surgeon: Wynonia Sours, MD;  Location: [place];  Service: Orthopedics;  Laterality: Left;  EXCISION MUCOID CYST, DEBRIDEMENT INTERPHALANGEAL JOINT LEFT THUMB  . mass removed from left ring finger  2013  . RETINAL DETACHMENT SURGERY Right   . SHOULDER ARTHROSCOPY  WITH ROTATOR CUFF REPAIR AND SUBACROMIAL DECOMPRESSION Left 07/26/2013   Procedure: LEFT SHOULDER ARTHROSCOPY WITH /SUBACROMIAL DECOMPRESSION AND DISTAL CLAVICLE RESECTION;  Surgeon: Yvette Rack., MD;  Location: [place];  Service: Orthopedics;  Laterality: Left;  . TOTAL KNEE ARTHROPLASTY Right 01/07/2014   Procedure: RIGHT TOTAL KNEE ARTHROPLASTY MEDIAL/LATERAL COMPARTMENTS WITH PATELLA RESURFACING;  Surgeon: Yvette Rack., MD;  Location: [place];  Service: Orthopedics;  Laterality: Right;    FAMILY HISTORY: Family History  Problem Relation Age of Onset  . Pancreatic cancer Mother   . Alcohol abuse Father     SOCIAL HISTORY:  Social History   Socioeconomic History  . Marital status: Married    Spouse name: Not on file  . Number of children: 2  . Years of education: College  . Highest education level: Not on file  Social Needs  . Financial resource strain: Not on file  . Food insecurity - worry: Not on file  . Food insecurity - inability: Not on file  . Transportation needs - medical: Not on file  . Transportation needs - non-medical: Not on file  Occupational History  . Occupation: Retired  Tobacco Use  . Smoking status: Never Smoker  . Smokeless tobacco: Never Used  Substance and Sexual Activity  . Alcohol use: No    Alcohol/week: 0.0 oz  . Drug use: No  . Sexual activity: Yes    Partners: Male    Birth control/protection: Post-menopausal  Other Topics Concern  . Not on file  Social History Narrative   Lives at home with her husband.   Left-handed.   No caffeine per day.     PHYSICAL EXAM   Vitals:   07/24/17 0739  BP: 134/74  Pulse: 79  Weight: 107 lb 12 oz (48.9 kg)  Height: 4' 11.5" (1.511 m)    Not recorded      Body mass index is 21.4 kg/m.  PHYSICAL EXAMNIATION:  Gen: NAD, conversant, well nourised, obese, well groomed                     Cardiovascular: Regular rate rhythm, no peripheral edema, warm, nontender. Eyes:  Conjunctivae clear without exudates or hemorrhage Neck: Supple, no carotid bruits. Pulmonary: Clear to auscultation bilaterally   NEUROLOGICAL EXAM: MMSE - Mini Mental State Exam 07/24/2017 06/15/2014  Orientation to time 2 5  Orientation to Place 4 5  Registration 3 3  Attention/ Calculation 4 5  Recall 0 2  Language- name 2 objects 2 2  Language- repeat 1 1  Language- follow 3 step command 3 3  Language- read & follow direction 1 1  Write a sentence 1 1  Copy design 1 1  Total score 22 29  animal naming 2   CRANIAL NERVES: CN II: Visual fields are full to confrontation. Fundoscopic exam is normal with sharp discs and no vascular changes. Pupils are round equal and briskly reactive to light. CN III, IV, VI: extraocular movement are normal. No ptosis. CN V: Facial sensation is intact to pinprick in all 3 divisions bilaterally. Corneal responses are intact.  CN VII: Face is symmetric with normal eye closure and smile. CN VIII: Hearing is normal to rubbing fingers CN IX, X: Palate elevates symmetrically. Phonation is normal. CN XI: Head turning and shoulder shrug are intact CN XII: Tongue is midline with normal movements and no atrophy.  MOTOR: There is no pronator drift of out-stretched arms. Muscle bulk and tone are normal. Muscle strength is normal.  REFLEXES: Reflexes are 2+ and symmetric at the biceps, triceps, knees, and ankles. Plantar responses are flexor.  SENSORY: Intact to light touch, pinprick, positional sensation and vibratory sensation are intact in fingers and toes.  COORDINATION: Rapid alternating movements and fine finger movements are intact. There is no dysmetria on finger-to-nose and heel-knee-shin.    GAIT/STANCE: Posture is normal. Gait is steady with normal steps, base, arm swing, and turning. Heel and toe walking are normal. Tandem gait is normal.  Romberg is absent.   DIAGNOSTIC DATA (LABS, IMAGING, TESTING) - I reviewed patient records, labs,  notes, testing and imaging myself where available.   ASSESSMENT AND PLAN  Neeta Storey is a 72 y.o. female    Dementia  Most consistent with central nervous system degenerative disorder   start Namenda 10 mg twice a day  Aricept 10mg  daily  Return in 3 months, good candidate for mild dementia trial  Marcial Pacas, M.D. Ph.D.  [facility] [address] [place], [place] [address] Ph: [phone] Fax: [phone]  CC: Shirline Frees, MD

## 2017-09-17 ENCOUNTER — Telehealth: Payer: Self-pay | Admitting: Neurology

## 2017-09-17 NOTE — Telephone Encounter (Signed)
I have given Margie in research this pt's information.

## 2017-09-17 NOTE — Telephone Encounter (Signed)
Pt's husband called, he said at [facility] it was discussed the pt being in a research study. He had forgot to call back about this but is interested in the pt participating. Please call to advise

## 2017-10-22 ENCOUNTER — Encounter: Payer: Self-pay | Admitting: Neurology

## 2017-10-22 ENCOUNTER — Ambulatory Visit (INDEPENDENT_AMBULATORY_CARE_PROVIDER_SITE_OTHER): Payer: PPO | Admitting: Neurology

## 2017-10-22 VITALS — BP 132/74 | HR 70 | Ht 59.5 in | Wt 108.5 lb

## 2017-10-22 DIAGNOSIS — F0390 Unspecified dementia without behavioral disturbance: Secondary | ICD-10-CM

## 2017-10-22 DIAGNOSIS — R109 Unspecified abdominal pain: Secondary | ICD-10-CM | POA: Diagnosis not present

## 2017-10-22 DIAGNOSIS — M170 Bilateral primary osteoarthritis of knee: Secondary | ICD-10-CM | POA: Diagnosis not present

## 2017-10-22 DIAGNOSIS — R413 Other amnesia: Secondary | ICD-10-CM | POA: Diagnosis not present

## 2017-10-22 DIAGNOSIS — R112 Nausea with vomiting, unspecified: Secondary | ICD-10-CM | POA: Diagnosis not present

## 2017-10-22 MED ORDER — RIVASTIGMINE 4.6 MG/24HR TD PT24: 4.6000 mg | MEDICATED_PATCH | Freq: Every day | TRANSDERMAL | 3 refills | Status: DC

## 2017-10-22 NOTE — Progress Notes (Signed)
PATIENT: [name] DOB: Jul 09, 1946  Chief Complaint  Patient presents with  . Memory Loss    MMSE 18/30 - 2 animals.  She is here with her husband, [name].  She was unable to continue donepezil due to intolerable diarrhea.  She is still taking memantine daily.     HISTORICAL  [name] is a 72 years old female, seen in refer by primary care physician Dr. Shirline Frees, for evaluation of memory loss, initial evaluation was on July 24, 2017.  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  She had significant decline since summer 2018, more noticeable word finding difficulties, no longer cooking, her husband has taken financial management at home, she also quit driving a few months ago,  There was no family history of dementia  She has become much less active, tends to watch TV all day long  Laboratory evaluations June 2018, hemoglobin of 14.2, creatinine 0.6, normal TSH 2.39, B12 414  MRI of the brain in July 2018::Generalized atrophy, supratentorium small vessel disease  UPDATE October 22 2017: Husband reported mild improvement with Namenda 10 mg twice a day, but she could not tolerate Aricept due to GI side effect, diarrhea, she also complains of recent onset of headaches, his right lower abdominal pain, has GI appointment pending,    REVIEW OF SYSTEMS: Full 14 system review of systems performed and notable only for abdominal pain, vomiting, memory loss, agitation, confusion ALLERGIES: Allergies  Allergen Reactions  .  Bupropion Other (See Comments)    Body aches   . Donepezil Diarrhea  . Melatonin Other (See Comments)    Shakes, nervous  . Sulfa Antibiotics Hives    HOME MEDICATIONS: Current Outpatient Medications  Medication Sig Dispense Refill  . Ascorbic Acid (VITAMIN C PO) Take by mouth daily.    . cholecalciferol (VITAMIN D) 1000 UNITS tablet Take 1,000 Units by mouth daily.    . Cyanocobalamin (VITAMIN B-12 PO) Take by mouth daily.    . fish oil-omega-3 fatty acids 1000 MG capsule Take 2 g by mouth daily.    . Ginkgo Biloba (GINKOBA PO) Take by mouth daily.    . Glucosamine-Chondroitin (GLUCOSAMINE CHONDR COMPLEX PO) Take 2 tablets by mouth daily.    . meloxicam (MOBIC) 15 MG tablet     . memantine (NAMENDA) 10 MG tablet Take 1 tablet (10 mg total) by mouth 2 (two) times daily. 60 tablet 11  . multivitamin (THERAGRAN) per tablet Take 1 tablet by mouth daily.    . vitamin E 100 UNIT capsule Take 100 Units by mouth daily.     No current facility-administered medications for this visit.     PAST MEDICAL HISTORY: Past Medical History:  Diagnosis Date  . Anxiety   . Arthritis    lower back & knees  . Back pain   . Cataracts, bilateral   . Depression    takes Citalopram daily  . Encephalitis 1973  . Headache(784.0)    occasionally from a fall a month ago  . Hearing loss   .  History of blood transfusion    no abnormal reaction noted  . Hypertension   . Insomnia   . Joint pain   . Joint swelling   . Memory loss   . Nocturia   . Osteoarthritis   . Rosacea     PAST SURGICAL HISTORY: Past Surgical History:  Procedure Laterality Date  . BACK SURGERY  2007   lumbar fusion w/ rods & screws- lower back  . COLONOSCOPY    . CYST EXCISION  2005   lumbar cyst  . JOINT REPLACEMENT  2009   left knee replacement  . MASS EXCISION  twice: 08/28/2011; 2012   Procedure: EXCISION MASS;  Surgeon: Wynonia Sours, MD;  Location: [place];  Service: Orthopedics;  Laterality:  Left;  excision mass left ring finger  . MASS EXCISION  02/11/2012   Procedure: EXCISION MASS;  Surgeon: Wynonia Sours, MD;  Location: [place];  Service: Orthopedics;  Laterality: Left;  EXCISION MUCOID CYST, DEBRIDEMENT INTERPHALANGEAL JOINT LEFT THUMB  . mass removed from left ring finger  2013  . RETINAL DETACHMENT SURGERY Right   . SHOULDER ARTHROSCOPY WITH ROTATOR CUFF REPAIR AND SUBACROMIAL DECOMPRESSION Left 07/26/2013   Procedure: LEFT SHOULDER ARTHROSCOPY WITH /SUBACROMIAL DECOMPRESSION AND DISTAL CLAVICLE RESECTION;  Surgeon: Yvette Rack., MD;  Location: [place];  Service: Orthopedics;  Laterality: Left;  . TOTAL KNEE ARTHROPLASTY Right 01/07/2014   Procedure: RIGHT TOTAL KNEE ARTHROPLASTY MEDIAL/LATERAL COMPARTMENTS WITH PATELLA RESURFACING;  Surgeon: Yvette Rack., MD;  Location: [place];  Service: Orthopedics;  Laterality: Right;    FAMILY HISTORY: Family History  Problem Relation Age of Onset  . Pancreatic cancer Mother   . Alcohol abuse Father     SOCIAL HISTORY:  Social History   Socioeconomic History  . Marital status: Married    Spouse name: Not on file  . Number of children: 2  . Years of education: College  . Highest education level: Not on file  Occupational History  . Occupation: Retired  Scientific laboratory technician  . Financial resource strain: Not on file  . Food insecurity:    Worry: Not on file    Inability: Not on file  . Transportation needs:    Medical: Not on file    Non-medical: Not on file  Tobacco Use  . Smoking status: Never Smoker  . Smokeless tobacco: Never Used  Substance and Sexual Activity  . Alcohol use: No    Alcohol/week: 0.0 oz  . Drug use: No  . Sexual activity: Yes    Partners: Male    Birth control/protection: Post-menopausal  Lifestyle  . Physical activity:    Days per week: Not on file    Minutes per session: Not on file  . Stress: Not on file  Relationships  . Social connections:    Talks on  phone: Not on file    Gets together: Not on file    Attends religious service: Not on file    Active member of club or organization: Not on file    Attends meetings of clubs or organizations: Not on file    Relationship status: Not on file  . Intimate partner violence:    Fear of current or ex partner: Not on file    Emotionally abused: Not on file    Physically abused: Not on file    Forced sexual activity: Not on file  Other Topics Concern  . Not on file  Social History  Narrative   Lives at home with her husband.   Left-handed.   No caffeine per day.     PHYSICAL EXAM   Vitals:   10/22/17 0829  BP: 132/74  Pulse: 70  Weight: 108 lb 8 oz (49.2 kg)  Height: 4' 11.5" (1.511 m)    Not recorded      Body mass index is 21.55 kg/m.  PHYSICAL EXAMNIATION:  Gen: NAD, conversant, well nourised, obese, well groomed                     Cardiovascular: Regular rate rhythm, no peripheral edema, warm, nontender. Eyes: Conjunctivae clear without exudates or hemorrhage Neck: Supple, no carotid bruits. Pulmonary: Clear to auscultation bilaterally   NEUROLOGICAL EXAM: MMSE - Mini Mental State Exam 10/22/2017 07/24/2017 06/15/2014  Orientation to time 2 2 5   Orientation to Place 4 4 5   Registration 3 3 3   Attention/ Calculation 0 4 5  Recall 0 0 2  Language- name 2 objects 2 2 2   Language- repeat 1 1 1   Language- follow 3 step command 3 3 3   Language- read & follow direction 1 1 1   Write a sentence 1 1 1   Copy design 1 1 1   Total score 18 22 29   animal naming 2   CRANIAL NERVES: CN II: Visual fields are full to confrontation.  Keep pupils are round equal and briskly reactive to light. CN III, IV, VI: extraocular movement are normal. No ptosis. CN V: Facial sensation is intact to pinprick in all 3 divisions bilaterally. Corneal responses are intact.  CN VII: Face is symmetric with normal eye closure and smile. CN VIII: Hearing is normal to rubbing fingers CN IX, X: Palate  elevates symmetrically. Phonation is normal. CN XI: Head turning and shoulder shrug are intact CN XII: Tongue is midline with normal movements and no atrophy.  MOTOR: There is no pronator drift of out-stretched arms. Muscle bulk and tone are normal. Muscle strength is normal.  REFLEXES: Reflexes are 2+ and symmetric at the biceps, triceps, knees, and ankles. Plantar responses are flexor.  SENSORY: Intact to light touch, pinprick, positional sensation and vibratory sensation are intact in fingers and toes.  COORDINATION: Rapid alternating movements and fine finger movements are intact. There is no dysmetria on finger-to-nose and heel-knee-shin.    GAIT/STANCE: Posture is normal. Gait is steady with normal steps, base, arm swing, and turning. Heel and toe walking are normal. Tandem gait is normal.  Romberg is absent.   DIAGNOSTIC DATA (LABS, IMAGING, TESTING) - I reviewed patient records, labs, notes, testing and imaging myself where available.   ASSESSMENT AND PLAN  [name] is a 72 y.o. female    Dementia  Most consistent with central nervous system degenerative disorder  Keep  Namenda 10 mg twice a day  Could not tolerate Aricept 10mg  daily due to GI side effect, diarrhea  Will try low-dose Exelon patch  Return in 6 months, good candidate for mild dementia trial, information is provided today  Marcial Pacas, M.D. Ph.D.  [facility] [address], [place], [place] [address] Ph: [phone] Fax: [phone]  CC: Shirline Frees, MD

## 2017-11-09 DIAGNOSIS — J069 Acute upper respiratory infection, unspecified: Secondary | ICD-10-CM | POA: Diagnosis not present

## 2017-11-17 ENCOUNTER — Other Ambulatory Visit: Payer: Self-pay | Admitting: *Deleted

## 2017-11-17 ENCOUNTER — Encounter: Payer: Self-pay | Admitting: *Deleted

## 2017-11-17 DIAGNOSIS — H6503 Acute serous otitis media, bilateral: Secondary | ICD-10-CM | POA: Diagnosis not present

## 2017-11-17 DIAGNOSIS — H9193 Unspecified hearing loss, bilateral: Secondary | ICD-10-CM | POA: Diagnosis not present

## 2017-11-17 DIAGNOSIS — H906 Mixed conductive and sensorineural hearing loss, bilateral: Secondary | ICD-10-CM | POA: Diagnosis not present

## 2017-11-17 NOTE — Patient Outreach (Signed)
[facility]) Care Management  11/17/2017  [name] 05-04-1946 [id number]  Referral via Nurse Call Center: telephone call from spouse-asking question about-patient's medication:  Telephone call to patient(spouse); left HIPPA compliant voice mail requesting call back.  Plan: Geophysicist/field seismologist. Follow up 2-4 business days.  Sherrin Daisy, RN BSN [place] Management Coordinator [facility] Care Management  [phone]

## 2017-11-21 ENCOUNTER — Other Ambulatory Visit: Payer: Self-pay | Admitting: *Deleted

## 2017-11-21 NOTE — Patient Outreach (Signed)
[facility]) Care Management  11/21/2017  Georgeanne Frankland [birth year] [id number]  Referral via Nurse Call Center: telephone call from spouse-asking question about-patient's medication:  Telephone call x2; spouse answered call (patient has hx -dementia) and was advised of reason for follow up call. Spouse states he has gotten questions answered regarding patient's medications. States patient is doing fine and she is able to take medications that were prescribed without any problems.  Patient was advised of [facility] care management services. Spouse states he has no concerns that require case management at this time. States he is very happy with his spouse's medical team. Thanked this care coordinator for calling & advised that he has [facility] contact information if needed.  Plan: Close case.  Sherrin Daisy, RN BSN [facility]CM Care Management Coordinator [facility] Care Management  [phone]   .

## 2017-12-10 DIAGNOSIS — H906 Mixed conductive and sensorineural hearing loss, bilateral: Secondary | ICD-10-CM | POA: Diagnosis not present

## 2017-12-10 DIAGNOSIS — H6122 Impacted cerumen, left ear: Secondary | ICD-10-CM | POA: Diagnosis not present

## 2018-04-21 NOTE — Progress Notes (Signed)
[facility]  PATIENT: [name] DOB: [birth year]   REASON FOR VISIT: Follow-up for memory loss  HISTORY FROM: Patient and husband    HISTORY OF PRESENT ILLNESS: [name] is a 72 years old female, seen in refer by primary care physician Dr. Shirline Frees, for evaluation of memory loss, initial evaluation was on July 24, 2017.  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  She had significant decline since summer 2018, more noticeable word finding difficulties, no longer cooking, her husband has taken financial management at home, she also quit driving a few months ago,  There was no family history of dementia  She has become much less active, tends to watch TV all day long  Laboratory evaluations June 2018, hemoglobin of 14.2, creatinine 0.6, normal TSH 2.39, B12 414  MRI of the brain in July 2018::Generalized atrophy, supratentorium small vessel disease  UPDATE October 22 2017:YY Husband reported mild improvement with Namenda 10 mg twice a day, but she could not tolerate Aricept due to GI side effect, diarrhea, she also complains of recent onset of headaches, his right lower abdominal pain, has GI appointment pending, UPDATE 10/10/2019CM Ms. Balla, 72 year old female returns for follow-up with history of dementia.  She has had problems with Aricept in the past and was placed on Exelon patch at her last visit.  She remains on Namenda 10 mg twice a day without side effects.  According to the husband  he feels her memory is better.  Appetite is good she sleeps well.  She can perform dressing feeding and bathing herself she no longer cooks she no longer drives.  She no longer does the finances.  She gets little exercise and was encouraged to do so.  No falls no balance issues.  She returns for reevaluation   REVIEW OF SYSTEMS: Full 14 system review of systems performed and notable only for those listed, all others are neg:  Constitutional: neg  Cardiovascular: neg Ear/Nose/Throat: Hearing loss Skin: neg Eyes: neg Respiratory: neg Gastroitestinal: neg  Hematology/Lymphatic: neg  Endocrine: neg Musculoskeletal: Back pain seen by Ortho Allergy/Immunology: neg Neurological: Memory loss Psychiatric: neg Sleep : neg   ALLERGIES: Allergies  Allergen Reactions  . Bupropion Other (See Comments)    Body aches   . Donepezil Diarrhea  . Melatonin Other (See Comments)    Shakes, nervous  . Sulfa Antibiotics Hives    HOME MEDICATIONS: Outpatient Medications Prior to Visit  Medication Sig Dispense Refill  . Ascorbic Acid (VITAMIN C PO) Take by mouth daily.    . cholecalciferol (VITAMIN D) 1000 UNITS tablet Take 1,000 Units by mouth daily.    . Cyanocobalamin (VITAMIN B-12 PO) Take by mouth daily.    . fish oil-omega-3 fatty acids 1000 MG capsule Take 2 g by mouth daily.    . Ginkgo Biloba (GINKOBA PO) Take by mouth daily.    . meloxicam (MOBIC) 15 MG tablet     . memantine (NAMENDA) 10 MG tablet Take 1 tablet (10 mg total) by mouth 2 (two) times  daily. 60 tablet 11  . multivitamin (THERAGRAN) per tablet Take 1 tablet by mouth daily.    . rivastigmine (EXELON) 4.6 mg/24hr Place 1 patch (4.6 mg total) onto the skin daily. 30 patch 3  . TURMERIC PO Take 1 capsule by mouth daily.    . vitamin E 100 UNIT capsule Take 100 Units by mouth daily.    . Glucosamine-Chondroitin (GLUCOSAMINE CHONDR COMPLEX PO) Take 2 tablets by mouth daily.     No facility-administered medications prior to  visit.     PAST MEDICAL HISTORY: Past Medical History:  Diagnosis Date  . Anxiety   . Arthritis    lower back & knees  . Back pain   . Cataracts, bilateral   . Depression    takes Citalopram daily  . Encephalitis 1973  . Headache(784.0)    occasionally from a fall a month ago  . Hearing loss   . History of blood transfusion    no abnormal reaction noted  . Hypertension   . Insomnia   . Joint pain   . Joint swelling   . Memory loss   . Nocturia   . Osteoarthritis   . Rosacea     PAST SURGICAL HISTORY: Past Surgical History:  Procedure Laterality Date  . BACK SURGERY  2007   lumbar fusion w/ rods & screws- lower back  . COLONOSCOPY    . CYST EXCISION  2005   lumbar cyst  . JOINT REPLACEMENT  2009   left knee replacement  . MASS EXCISION  twice: 08/28/2011; 2012   Procedure: EXCISION MASS;  Surgeon: Wynonia Sours, MD;  Location: [place];  Service: Orthopedics;  Laterality: Left;  excision mass left ring finger  . MASS EXCISION  02/11/2012   Procedure: EXCISION MASS;  Surgeon: Wynonia Sours, MD;  Location: [place];  Service: Orthopedics;  Laterality: Left;  EXCISION MUCOID CYST, DEBRIDEMENT INTERPHALANGEAL JOINT LEFT THUMB  . mass removed from left ring finger  2013  . RETINAL DETACHMENT SURGERY Right   . SHOULDER ARTHROSCOPY WITH ROTATOR CUFF REPAIR AND SUBACROMIAL DECOMPRESSION Left 07/26/2013   Procedure: LEFT SHOULDER ARTHROSCOPY WITH /SUBACROMIAL DECOMPRESSION AND DISTAL CLAVICLE RESECTION;  Surgeon: Yvette Rack., MD;  Location: [place];  Service: Orthopedics;  Laterality: Left;  . TOTAL KNEE ARTHROPLASTY Right 01/07/2014   Procedure: RIGHT TOTAL KNEE ARTHROPLASTY MEDIAL/LATERAL COMPARTMENTS WITH PATELLA RESURFACING;  Surgeon: Yvette Rack., MD;  Location: [place];  Service: Orthopedics;  Laterality: Right;    FAMILY HISTORY: Family History  Problem Relation Age of Onset  . Pancreatic cancer Mother   . Alcohol  abuse Father     SOCIAL HISTORY: Social History   Socioeconomic History  . Marital status: Married    Spouse name: Not on file  . Number of children: 2  . Years of education: College  . Highest education level: Not on file  Occupational History  . Occupation: Retired  Scientific laboratory technician  . Financial resource strain: Not on file  . Food insecurity:    Worry: Not on file    Inability: Not on file  . Transportation needs:    Medical: Not on file    Non-medical: Not on file  Tobacco Use  . Smoking status: Never Smoker  . Smokeless tobacco: Never Used  Substance and Sexual Activity  . Alcohol use: No    Alcohol/week: 0.0 standard drinks  . Drug use: No  . Sexual activity: Yes  Partners: Male    Birth control/protection: Post-menopausal  Lifestyle  . Physical activity:    Days per week: Not on file    Minutes per session: Not on file  . Stress: Not on file  Relationships  . Social connections:    Talks on phone: Not on file    Gets together: Not on file    Attends religious service: Not on file    Active member of club or organization: Not on file    Attends meetings of clubs or organizations: Not on file    Relationship status: Not on file  . Intimate partner violence:    Fear of current or ex partner: Not on file    Emotionally abused: Not on file    Physically abused: Not on file    Forced sexual activity: Not on file  Other Topics Concern  . Not on file  Social History Narrative   Lives at home with her husband.   Left-handed.   No caffeine per day.     PHYSICAL EXAM  Vitals:   04/23/18 0908  BP: 119/64  Pulse: 68  Weight: 115 lb 9.6 oz (52.4 kg)  Height: 4' 11.5" (1.511 m)   Body mass index is 22.96 kg/m.  Generalized: Well developed, in no acute distress  Head: normocephalic and atraumatic,. Oropharynx benign  Neck: Supple, Musculoskeletal: No deformity   Neurological examination   Mentation: Alert Unable to draw clock MMSE - Mini Mental  State Exam 04/23/2018 10/22/2017 07/24/2017  Orientation to time 0 2 2  Orientation to Place 2 4 4   Registration 3 3 3   Attention/ Calculation 4 0 4  Recall 1 0 0  Language- name 2 objects 1 2 2   Language- repeat 1 1 1   Language- follow 3 step command 1 3 3   Language- read & follow direction 1 1 1   Write a sentence 1 1 1   Copy design 1 1 1   Total score 16 18 22   Follows all commands speech and language fluent.   Cranial nerve II-XII: Pupils were equal round reactive to light extraocular movements were full, visual field were full on confrontational test. Facial sensation and strength were normal. hearing was intact to finger rubbing bilaterally. Uvula tongue midline. head turning and shoulder shrug were normal and symmetric.Tongue protrusion into cheek strength was normal. Motor: normal bulk and tone, full strength in the BUE, BLE, Sensory: normal and symmetric to light touch,  Coordination: finger-nose-finger, heel-to-shin bilaterally, no dysmetria Reflexes: Brachioradialis 2/2, biceps 2/2, triceps 2/2, patellar 2/2, Achilles 2/2, plantar responses were flexor bilaterally. Gait and Station: Rising up from seated position without assistance, normal stance,  moderate stride, good arm swing, smooth turning, able to perform tiptoe, and heel walking without difficulty. Tandem gait is steady.  No assistive device  DIAGNOSTIC DATA (LABS, IMAGING, TESTING) - I reviewed patient records, labs, notes, testing and imaging myself where available.  Lab Results  Component Value Date   WBC 10.2 01/10/2014   HGB 10.5 (L) 01/10/2014   HCT 31.7 (L) 01/10/2014   MCV 89.5 01/10/2014   PLT 179 01/10/2014      Component Value Date/Time   NA 140 01/10/2014 0041   K 4.6 01/10/2014 0041   CL 103 01/10/2014 0041   CO2 27 01/10/2014 0041   GLUCOSE 118 (H) 01/10/2014 0041   BUN 27 (H) 01/10/2014 0041   CREATININE 0.70 01/10/2014 0041   CALCIUM 8.2 (L) 01/10/2014 0041   PROT 7.3 12/28/2013 1135    ALBUMIN  3.8 12/28/2013 1135   AST 20 12/28/2013 1135   ALT 16 12/28/2013 1135   ALKPHOS 78 12/28/2013 1135   BILITOT 0.4 12/28/2013 1135   GFRNONAA 86 (L) 01/10/2014 0041   GFRAA >90 01/10/2014 0041    ASSESSMENT AND PLAN Teresina Bugaj is a 72 y.o. female   here to follow-up for dementia most consistent with central nervous system degenerative disorder.  She could not tolerate Aricept due to GI side effects.   PLAN: Continue Namenda 10 mg twice daily Increase Exelon patch to 9.5 mg daily Exercise by walking at least daily Strategy games such as crossword puzzles to do.  Solitaire etc. Follow-up in 6 months Dennie Bible, [facility], [facility], APRN  [facility] [address], [place], [place] [address] [phone]

## 2018-04-23 ENCOUNTER — Encounter: Payer: Self-pay | Admitting: Nurse Practitioner

## 2018-04-23 ENCOUNTER — Ambulatory Visit (INDEPENDENT_AMBULATORY_CARE_PROVIDER_SITE_OTHER): Payer: PPO | Admitting: Nurse Practitioner

## 2018-04-23 VITALS — BP 119/64 | HR 68 | Ht 59.5 in | Wt 115.6 lb

## 2018-04-23 DIAGNOSIS — F0390 Unspecified dementia without behavioral disturbance: Secondary | ICD-10-CM | POA: Diagnosis not present

## 2018-04-23 MED ORDER — RIVASTIGMINE 9.5 MG/24HR TD PT24: 9.5000 mg | MEDICATED_PATCH | Freq: Every day | TRANSDERMAL | 6 refills | Status: DC

## 2018-04-23 NOTE — Progress Notes (Signed)
I have reviewed and agreed above plan. 

## 2018-04-23 NOTE — Patient Instructions (Signed)
Continue Namenda 10 mg twice daily Increase Exelon patch to 9.5 mg daily Exercise by walking at least daily Strategy games such as crossword puzzles to do.  Solitaire etc. Follow-up in 6 months

## 2018-08-25 ENCOUNTER — Other Ambulatory Visit: Payer: Self-pay | Admitting: Neurology

## 2018-10-27 ENCOUNTER — Other Ambulatory Visit: Payer: Self-pay

## 2018-10-27 ENCOUNTER — Ambulatory Visit (INDEPENDENT_AMBULATORY_CARE_PROVIDER_SITE_OTHER): Payer: PPO | Admitting: Neurology

## 2018-10-27 ENCOUNTER — Telehealth: Payer: Self-pay | Admitting: Neurology

## 2018-10-27 ENCOUNTER — Encounter: Payer: Self-pay | Admitting: Neurology

## 2018-10-27 DIAGNOSIS — F0390 Unspecified dementia without behavioral disturbance: Secondary | ICD-10-CM | POA: Diagnosis not present

## 2018-10-27 MED ORDER — RIVASTIGMINE 13.3 MG/24HR TD PT24: 13.3000 mg | MEDICATED_PATCH | Freq: Every day | TRANSDERMAL | 4 refills | Status: DC

## 2018-10-27 MED ORDER — MEMANTINE HCL 10 MG PO TABS: 10.0000 mg | ORAL_TABLET | Freq: Two times a day (BID) | ORAL | 4 refills | Status: DC

## 2018-10-27 NOTE — Telephone Encounter (Signed)
LVM to schedule a 6 month follow-up with Dr. Krista Blue.

## 2018-10-27 NOTE — Progress Notes (Signed)
PATIENT: [name] DOB: Jan 03, 1946  HISTORICAL  Virtual Visit via Video    I connected with Audrie Gallus on 10/27/18 at  By video  And verified that I am speaking with the correct person using two identifiers.   I discussed the limitations, risks, security and privacy concerns of performing an evaluation and management service by video And the availability of in person appointments. I also discussed with the patient that there may be a patient responsible charge related to this service. The patient expressed understanding and agreed to proceed.    HISTORY OF PRESENT ILLNESS: [name] is a 73 years old female, seen in refer by primary care physician Dr. Shirline Frees, for evaluation of memory loss, initial evaluation was on July 24, 2017.  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  She had significant decline since summer 2018, more noticeable word finding difficulties, no longer cooking, her husband has taken financial management at home, she also quit driving a few months ago,  There was no family history of dementia  She has become much less active, tends to watch TV all day long  Laboratory evaluations June 2018, hemoglobin of 14.2, creatinine 0.6, normal TSH 2.39, B12 414  MRI of the brain in July 2018::Generalized atrophy, supratentorium small vessel disease  UPDATE October 22 2017:  Husband reported mild improvement with Namenda 10 mg twice a day, but she could not tolerate Aricept due to GI side effect,  diarrhea, she also complains of recent onset of headaches, his right lower abdominal pain, has GI appointment pending,  Update October 27, 2018, I was able to interview patient, with her husband present.  She is overall doing well with current medications Namenda 10 mg twice a day, Exelon patch 9.5 mg daily, she sleeps well, has good appetite    REVIEW OF SYSTEMS: Full 14 system review of systems performed and notable only for those listed, all others are neg:     Observations/Objective: I have reviewed problem lists, medications, allergies.  She is not oriented to year, month, her age, was confused about current event, do not know why she cannot go out as usual,  Assessment and Plan: Dementia  Doing well with current medication, refilled her Namenda 10 mg twice a day, 18-month supply  Change Exelon patch to 13.3 mg every day  Follow Up Instructions:  6 months    I discussed the assessment and treatment plan with the patient. The patient was provided an opportunity to ask questions and all were answered. The patient agreed with the plan and demonstrated an understanding of the instructions.   The patient was advised to call back or seek an in-person evaluation if the symptoms worsen or if the condition fails to improve as anticipated.  I provided 30 minutes of non-face-to-face time during this encounter.    REVIEW OF SYSTEMS: Full 14 system review of systems performed and notable only for as above All other review of systems were negative.  ALLERGIES: Allergies  Allergen Reactions  . Bupropion Other (See  Comments)    Body aches   . Donepezil Diarrhea  . Melatonin Other (See Comments)    Shakes, nervous  . Sulfa Antibiotics Hives    HOME MEDICATIONS: Current Outpatient Medications  Medication Sig Dispense Refill  . Ascorbic Acid (VITAMIN C PO) Take by mouth daily.    . cholecalciferol (VITAMIN D) 1000 UNITS tablet Take 1,000 Units by mouth daily.    . Cyanocobalamin  (VITAMIN B-12 PO) Take by mouth daily.    . fish oil-omega-3 fatty acids 1000 MG capsule Take 2 g by mouth daily.    . Ginkgo Biloba (GINKOBA PO) Take by mouth daily.    . Glucosamine-Chondroitin (GLUCOSAMINE CHONDR COMPLEX PO) Take 2 tablets by mouth daily.    . meloxicam (MOBIC) 15 MG tablet     . memantine (NAMENDA) 10 MG tablet TAKE 1 TABLET BY MOUTH TWICE DAILY 60 tablet 5  . multivitamin (THERAGRAN) per tablet Take 1 tablet by mouth daily.    . rivastigmine (EXELON) 9.5 mg/24hr Place 1 patch (9.5 mg total) onto the skin daily. 30 patch 6  . TURMERIC PO Take 1 capsule by mouth daily.    . vitamin E 100 UNIT capsule Take 100 Units by mouth daily.     No current facility-administered medications for this visit.     PAST MEDICAL HISTORY: Past Medical History:  Diagnosis Date  . Anxiety   . Arthritis    lower back & knees  . Back pain   . Cataracts, bilateral   . Depression    takes Citalopram daily  . Encephalitis 1973  . Headache(784.0)    occasionally from a fall a month ago  . Hearing loss   . History of blood transfusion    no abnormal reaction noted  . Hypertension   . Insomnia   . Joint pain   . Joint swelling   . Memory loss   . Nocturia   . Osteoarthritis   . Rosacea     PAST SURGICAL HISTORY: Past Surgical History:  Procedure Laterality Date  . BACK SURGERY  2007   lumbar fusion w/ rods & screws- lower back  . COLONOSCOPY    . CYST EXCISION  2005   lumbar cyst  . JOINT REPLACEMENT  2009   left knee replacement  . MASS EXCISION  twice: 08/28/2011; 2012   Procedure: EXCISION MASS;  Surgeon: Wynonia Sours, MD;  Location: [place];  Service: Orthopedics;  Laterality: Left;  excision mass left ring finger  . MASS EXCISION  02/11/2012   Procedure: EXCISION MASS;  Surgeon: Wynonia Sours, MD;  Location: [place];  Service: Orthopedics;  Laterality: Left;  EXCISION MUCOID CYST, DEBRIDEMENT INTERPHALANGEAL JOINT LEFT THUMB  . mass  removed from left ring finger  2013  . RETINAL DETACHMENT SURGERY Right   . SHOULDER ARTHROSCOPY WITH ROTATOR CUFF REPAIR AND SUBACROMIAL DECOMPRESSION Left 07/26/2013   Procedure: LEFT SHOULDER ARTHROSCOPY WITH /SUBACROMIAL DECOMPRESSION AND DISTAL CLAVICLE RESECTION;  Surgeon: Yvette Rack., MD;  Location: [place];  Service: Orthopedics;  Laterality: Left;  . TOTAL KNEE ARTHROPLASTY Right 01/07/2014   Procedure: RIGHT TOTAL KNEE ARTHROPLASTY MEDIAL/LATERAL COMPARTMENTS WITH PATELLA RESURFACING;  Surgeon: Yvette Rack., MD;  Location: [place];  Service: Orthopedics;  Laterality: Right;    FAMILY HISTORY: Family History  Problem Relation Age of Onset  . Pancreatic cancer Mother   . Alcohol abuse Father     SOCIAL HISTORY:   Social  History   Socioeconomic History  . Marital status: Married    Spouse name: Not on file  . Number of children: 2  . Years of education: College  . Highest education level: Not on file  Occupational History  . Occupation: Retired  Scientific laboratory technician  . Financial resource strain: Not on file  . Food insecurity:    Worry: Not on file    Inability: Not on file  . Transportation needs:    Medical: Not on file    Non-medical: Not on file  Tobacco Use  . Smoking status: Never Smoker  . Smokeless tobacco: Never Used  Substance and Sexual Activity  . Alcohol use: No    Alcohol/week: 0.0 standard drinks  . Drug use: No  . Sexual activity: Yes    Partners: Male    Birth control/protection: Post-menopausal  Lifestyle  . Physical activity:    Days per week: Not on file    Minutes per session: Not on file  . Stress: Not on file  Relationships  . Social connections:    Talks on phone: Not on file    Gets together: Not on file    Attends religious service: Not on file    Active member of club or organization: Not on file    Attends meetings of clubs or organizations: Not on file    Relationship status: Not on file  . Intimate partner  violence:    Fear of current or ex partner: Not on file    Emotionally abused: Not on file    Physically abused: Not on file    Forced sexual activity: Not on file  Other Topics Concern  . Not on file  Social History Narrative   Lives at home with her husband.   Left-handed.   No caffeine per day.    Marcial Pacas, M.D. Ph.D.  [facility] [address], [place], [place] [address] Ph: [phone] Fax: [phone]  CC: Referring Provider

## 2018-10-29 ENCOUNTER — Telehealth: Payer: Self-pay | Admitting: Neurology

## 2018-10-29 NOTE — Telephone Encounter (Signed)
I returned the call to her husband.  He has noticed a change in her breathing over the last 2-3 months.  She has actually been on Exelon since 10/2017.  Dr. Krista Blue stated that she could try a short trial off the medication to see if it is contributing to these symptoms, if they would like.  She has never been seen by her PCP for this change in breathing.  Her husband would like to seek a full evaluation of other potential causes before coming off the medication.  He feels she receives a lot of benefit from it.  He will call us back for any further concerns.

## 2018-10-29 NOTE — Telephone Encounter (Signed)
Pts husband states that the pt is wheezing while she sleeps. He thinks it has to do with her medication rivastigmine (EXELON) 13.3 MG/24HR He would like a call back to discuss.

## 2019-01-27 DIAGNOSIS — G300 Alzheimer's disease with early onset: Secondary | ICD-10-CM | POA: Diagnosis not present

## 2019-01-27 DIAGNOSIS — F0280 Dementia in other diseases classified elsewhere without behavioral disturbance: Secondary | ICD-10-CM | POA: Diagnosis not present

## 2019-05-04 ENCOUNTER — Encounter: Payer: Self-pay | Admitting: Neurology

## 2019-05-04 ENCOUNTER — Ambulatory Visit (INDEPENDENT_AMBULATORY_CARE_PROVIDER_SITE_OTHER): Payer: PPO | Admitting: Neurology

## 2019-05-04 ENCOUNTER — Other Ambulatory Visit: Payer: Self-pay

## 2019-05-04 VITALS — BP 127/75 | HR 81 | Temp 98.0°F | Ht 59.5 in | Wt 124.0 lb

## 2019-05-04 DIAGNOSIS — F0390 Unspecified dementia without behavioral disturbance: Secondary | ICD-10-CM | POA: Diagnosis not present

## 2019-05-04 DIAGNOSIS — F03918 Unspecified dementia, unspecified severity, with other behavioral disturbance: Secondary | ICD-10-CM | POA: Insufficient documentation

## 2019-05-04 MED ORDER — MEMANTINE HCL 10 MG PO TABS: 10.0000 mg | ORAL_TABLET | Freq: Two times a day (BID) | ORAL | 4 refills | Status: DC

## 2019-05-04 MED ORDER — RIVASTIGMINE 13.3 MG/24HR TD PT24: 13.3000 mg | MEDICATED_PATCH | Freq: Every day | TRANSDERMAL | 4 refills | Status: DC

## 2019-05-04 NOTE — Progress Notes (Signed)
PATIENT: [name] DOB: [birth year]  Chief Complaint  Patient presents with  . Memory Loss    MMSE 15/30 - 2 animals.  She is here with her husband, [name].  They both feel her memory is about the same.  They are concerned loose stools may be related to her memory medications (Namenda, Exelon).     HISTORICAL : [name] is a 73 years old female, seen in refer by primary care physician Dr. Shirline Frees, for evaluation of memory loss, initial evaluation was on July 24, 2017.  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  She had significant decline since summer 2018, more noticeable word finding difficulties, no longer cooking, her husband has taken financial management at home, she also quit driving a few months ago,  There was no family history of dementia  She has become much less active, tends to watch TV all day long  Laboratory evaluations June 2018, hemoglobin of 14.2, creatinine 0.6, normal TSH 2.39, B12 414  MRI of the brain in July 2018::Generalized atrophy, supratentorium small vessel disease  UPDATE October 22 2017:  Husband reported mild improvement with Namenda 10 mg twice a day, but she could not tolerate Aricept due to GI side effect, diarrhea, she also complains of recent onset of headaches, his right lower abdominal pain, has GI appointment pending,  Virtual visit on October 27, 2018, I was able to interview patient, with her husband present.  She is overall doing well with current medications Namenda  10 mg twice a day, Exelon patch 9.5 mg daily, she sleeps well, has good appetite  UPDATE May 04 2019: She is with her husband at today's clinical visit, Mini-Mental Status Examination is 15 out of 30, she sleeps well, eats well,  REVIEW OF SYSTEMS: Full 14 system review of systems performed and notable only for as above All other review of systems were negative.  ALLERGIES: Allergies  Allergen Reactions  . Bupropion Other (See Comments)    Body aches   . Donepezil Diarrhea  . Melatonin Other (See Comments)    Shakes, nervous  . Sulfa Antibiotics Hives    HOME MEDICATIONS: Current Outpatient Medications  Medication Sig Dispense Refill  . Ascorbic Acid (VITAMIN C PO) Take by mouth daily.    . cholecalciferol (VITAMIN D) 1000 UNITS tablet Take 1,000 Units by mouth daily.    . Cyanocobalamin (VITAMIN B-12 PO) Take by mouth daily.    . Ginkgo Biloba (GINKOBA PO) Take by mouth daily.    . memantine (NAMENDA) 10 MG tablet Take 1 tablet (10 mg total) by mouth 2 (two) times daily. 180 tablet 4  . multivitamin (THERAGRAN) per tablet Take 1 tablet by mouth daily.    . rivastigmine (EXELON) 13.3 MG/24HR Place 1 patch (13.3 mg total) onto the skin daily. 90 patch 4  . TURMERIC PO Take 1 capsule by mouth daily.    . vitamin E 100 UNIT capsule Take 100 Units by mouth daily.     No current facility-administered medications for this visit.  PAST MEDICAL HISTORY: Past Medical History:  Diagnosis Date  . Anxiety   . Arthritis    lower back & knees  . Back pain   . Cataracts, bilateral   . Depression    takes Citalopram daily  . Encephalitis 1973  . Headache(784.0)    occasionally from a fall a month ago  . Hearing loss   . History of blood transfusion    no abnormal reaction noted  . Hypertension   . Insomnia   . Joint pain   . Joint swelling   . Memory loss   . Nocturia   . Osteoarthritis   . Rosacea     PAST SURGICAL HISTORY: Past Surgical History:  Procedure  Laterality Date  . BACK SURGERY  2007   lumbar fusion w/ rods & screws- lower back  . COLONOSCOPY    . CYST EXCISION  2005   lumbar cyst  . JOINT REPLACEMENT  2009   left knee replacement  . MASS EXCISION  twice: 08/28/2011; 2012   Procedure: EXCISION MASS;  Surgeon: Wynonia Sours, MD;  Location: [facility];  Service: Orthopedics;  Laterality: Left;  excision mass left ring finger  . MASS EXCISION  02/11/2012   Procedure: EXCISION MASS;  Surgeon: Wynonia Sours, MD;  Location: [facility];  Service: Orthopedics;  Laterality: Left;  EXCISION MUCOID CYST, DEBRIDEMENT INTERPHALANGEAL JOINT LEFT THUMB  . mass removed from left ring finger  2013  . RETINAL DETACHMENT SURGERY Right   . SHOULDER ARTHROSCOPY WITH ROTATOR CUFF REPAIR AND SUBACROMIAL DECOMPRESSION Left 07/26/2013   Procedure: LEFT SHOULDER ARTHROSCOPY WITH /SUBACROMIAL DECOMPRESSION AND DISTAL CLAVICLE RESECTION;  Surgeon: Yvette Rack., MD;  Location: [facility];  Service: Orthopedics;  Laterality: Left;  . TOTAL KNEE ARTHROPLASTY Right 01/07/2014   Procedure: RIGHT TOTAL KNEE ARTHROPLASTY MEDIAL/LATERAL COMPARTMENTS WITH PATELLA RESURFACING;  Surgeon: Yvette Rack., MD;  Location: [facility];  Service: Orthopedics;  Laterality: Right;    FAMILY HISTORY: Family History  Problem Relation Age of Onset  . Pancreatic cancer Mother   . Alcohol abuse Father     SOCIAL HISTORY: Social History   Socioeconomic History  . Marital status: Married    Spouse name: Not on file  . Number of children: 2  . Years of education: College  . Highest education level: Not on file  Occupational History  . Occupation: Retired  Scientific laboratory technician  . Financial resource strain: Not on file  . Food insecurity    Worry: Not on file    Inability: Not on file  . Transportation needs    Medical: Not on file    Non-medical: Not on file  Tobacco Use  . Smoking status: Never Smoker  . Smokeless tobacco: Never Used   Substance and Sexual Activity  . Alcohol use: No    Alcohol/week: 0.0 standard drinks  . Drug use: No  . Sexual activity: Yes    Partners: Male    Birth control/protection: Post-menopausal  Lifestyle  . Physical activity    Days per week: Not on file    Minutes per session: Not on file  . Stress: Not on file  Relationships  . Social Herbalist on phone: Not on file    Gets together: Not on file    Attends religious service: Not on file    Active member of club or organization: Not on file    Attends meetings of clubs or  organizations: Not on file    Relationship status: Not on file  . Intimate partner violence    Fear of current or ex partner: Not on file    Emotionally abused: Not on file    Physically abused: Not on file    Forced sexual activity: Not on file  Other Topics Concern  . Not on file  Social History Narrative   Lives at home with her husband.   Left-handed.   No caffeine per day.     PHYSICAL EXAM   Vitals:   05/04/19 0928  BP: 127/75  Pulse: 81  Temp: 98 F (36.7 C)  Weight: 124 lb (56.2 kg)  Height: 4' 11.5" (1.511 m)    Not recorded      Body mass index is 24.63 kg/m.  PHYSICAL EXAMNIATION:  Gen: NAD, conversant, well nourised, well groomed                     Cardiovascular: Regular rate rhythm, no peripheral edema, warm, nontender. Eyes: Conjunctivae clear without exudates or hemorrhage Neck: Supple, no carotid bruits. Pulmonary: Clear to auscultation bilaterally   NEUROLOGICAL EXAM:  MMSE - Mini Mental State Exam 05/04/2019 04/23/2018 10/22/2017  Orientation to time 0 0 2  Orientation to Place 4 2 4   Registration 3 3 3   Attention/ Calculation 0 4 0  Recall 0 1 0  Language- name 2 objects 1 1 2   Language- repeat 1 1 1   Language- follow 3 step command 3 1 3   Language- read & follow direction 1 1 1   Write a sentence 1 1 1   Copy design 1 1 1   Total score 15 16 18      CRANIAL NERVES: CN II: Visual fields are full  to confrontation. Pupils are round equal and briskly reactive to light. CN III, IV, VI: extraocular movement are normal. No ptosis. CN V: Facial sensation is intact to pinprick in all 3 divisions bilaterally. Corneal responses are intact.  CN VII: Face is symmetric with normal eye closure and smile. CN VIII: Hearing is normal to causal conversation. CN IX, X: Palate elevates symmetrically. Phonation is normal. CN XI: Head turning and shoulder shrug are intact CN XII: Tongue is midline with normal movements and no atrophy.  MOTOR: There is no pronator drift of out-stretched arms. Muscle bulk and tone are normal. Muscle strength is normal.  REFLEXES: Reflexes are 2+ and symmetric at the biceps, triceps, knees, and ankles. Plantar responses are flexor.  SENSORY: Intact to light touch, pinprick, positional sensation and vibratory sensation are intact in fingers and toes.  COORDINATION: Rapid alternating movements and fine finger movements are intact. There is no dysmetria on finger-to-nose and heel-knee-shin.    GAIT/STANCE: Posture is normal. Gait is steady with normal steps, base, arm swing, and turning.   DIAGNOSTIC DATA (LABS, IMAGING, TESTING) - I reviewed patient records, labs, notes, testing and imaging myself where available.   ASSESSMENT AND PLAN  Aury Toia is a 73 y.o. female   Dementia  Overall stable,  Continue Namenda 10 mg twice a day, Exelon patch 13.3 mg every day  Continue moderate exercise   Marcial Pacas, M.D. Ph.D.  [facility] [address], [place],  [address] Ph: [phone] Fax: [phone]  CC: Referring Provider

## 2019-07-10 IMAGING — MR MR HEAD W/O CM
10 series · 44 of 48 positions shown · non-contrast
Comparison: Brain MRI 05/29/2014.

CLINICAL DATA: 70-year-old female status post fall in shower [REDACTED]. No loss of consciousness. Subsequent frontal headaches and
memory loss.

EXAM:
MRI HEAD WITHOUT CONTRAST
TECHNIQUE: Multiplanar, multiecho pulse sequences of the brain and surrounding
structures were obtained without intravenous contrast.

[Series 2: T1 · sagittal · 5.0mm · 0.45mm/px · 1 of 23 slices shown]
[im 1/23]
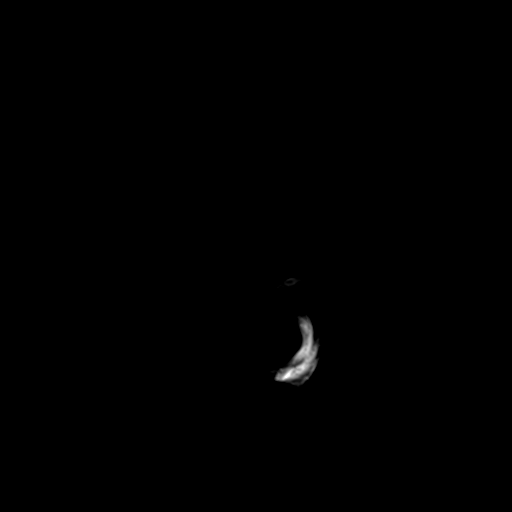

[Series 3: DWI · axial · 3.0mm · 1.80mm/px · z∈[-96,+51]mm · 9 of 100 slices shown (1 of 4)]
[im 1/100]
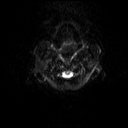
[im 13/100]
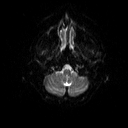
[im 25/100]
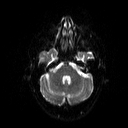
[im 38/100]
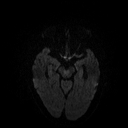
[im 50/100]
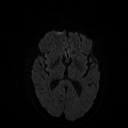
[im 62/100]
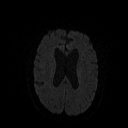
[im 75/100]
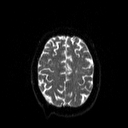
[im 87/100]
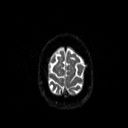
[im 100/100]
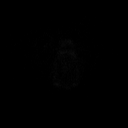

[Series 4: DWI · axial · 3.0mm · 1.80mm/px · z∈[-96,+51]mm · 4 of 49 slices shown (2 of 4)]
[im 1/49]
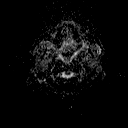
[im 17/49]
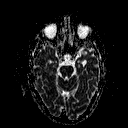
[im 33/49]
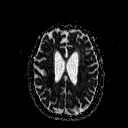
[im 49/49]
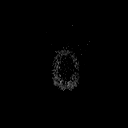

[Series 5: DWI · coronal · 5.0mm · 1.80mm/px · 6 of 71 slices shown (3 of 4)]
[im 1/71]
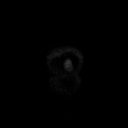
[im 15/71]
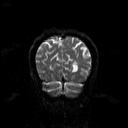
[im 29/71]
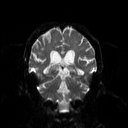
[im 43/71]
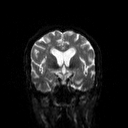
[im 57/71]
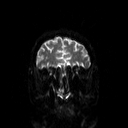
[im 71/71]
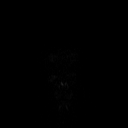

[Series 6: DWI · coronal · 5.0mm · 1.80mm/px · 3 of 36 slices shown (4 of 4)]
[im 1/36]
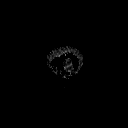
[im 18/36]
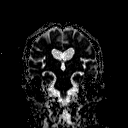
[im 36/36]
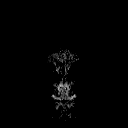

[Series 7: T2 · axial · 5.0mm · 0.51mm/px · z∈[-95,+52]mm · 2 of 22 slices shown (1 of 2)]
[im 1/22]
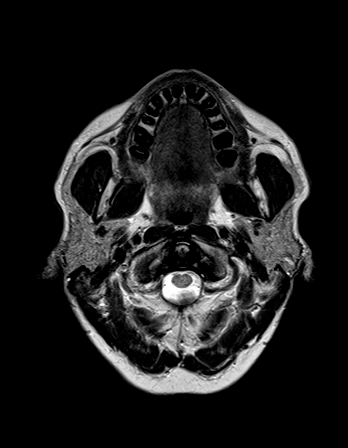
[im 22/22]
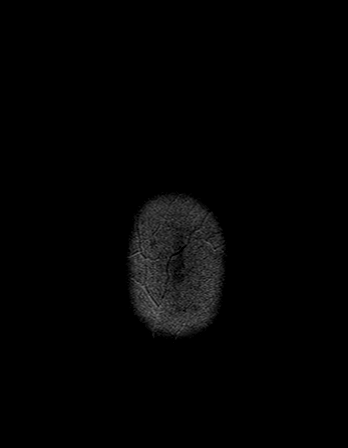

[Series 8: FLAIR · axial · 3.0mm · 0.45mm/px · z∈[-96,+49]mm · 3 of 32 slices shown]
[im 1/32]
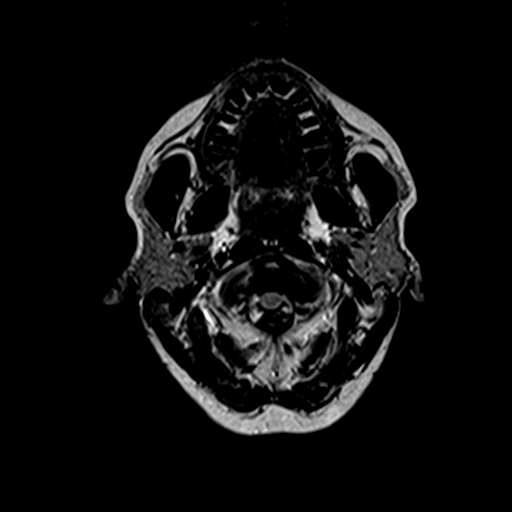
[im 16/32]
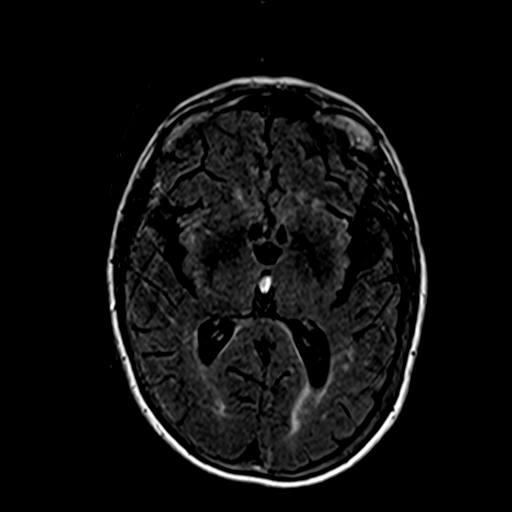
[im 32/32]
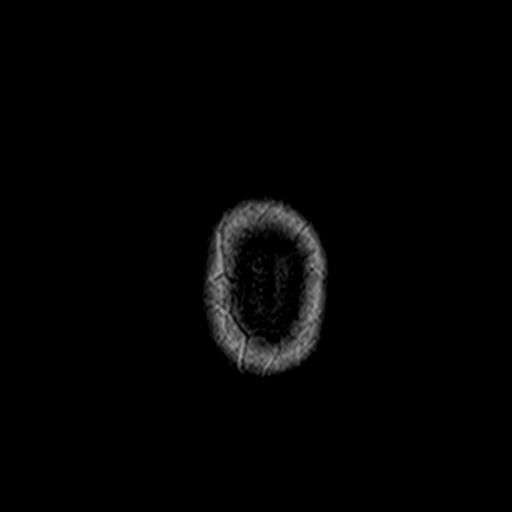

[Series 10: swi_images · axial · 5.0mm · 0.90mm/px · z∈[-96,+49]mm · 3 of 30 slices shown]
[im 1/30]
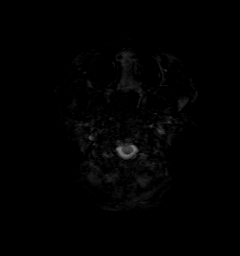
[im 15/30]
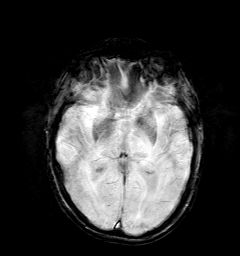
[im 30/30]
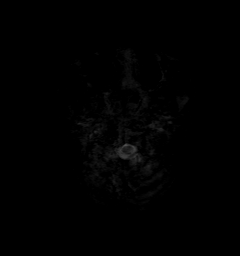

[Series 11: t1_mpr_tra · axial · 1.0mm · 0.45mm/px · z∈[-94,+65]mm · 10 of 160 slices shown]
[im 1/160]
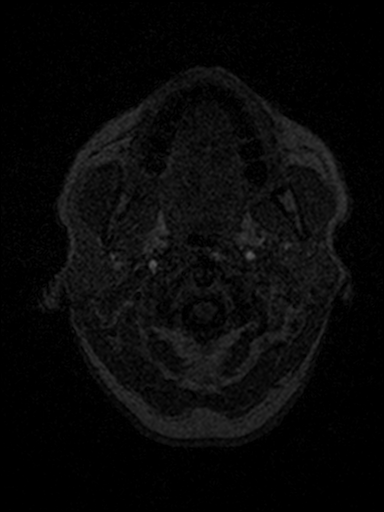
[im 13/160]
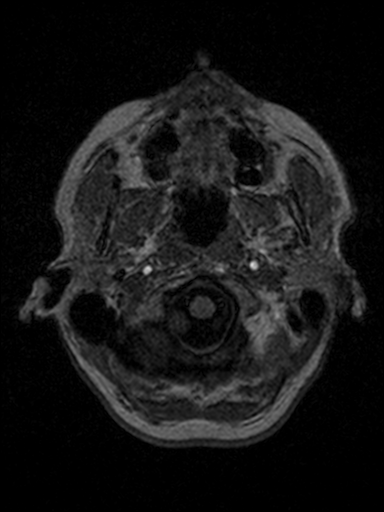
[im 25/160]
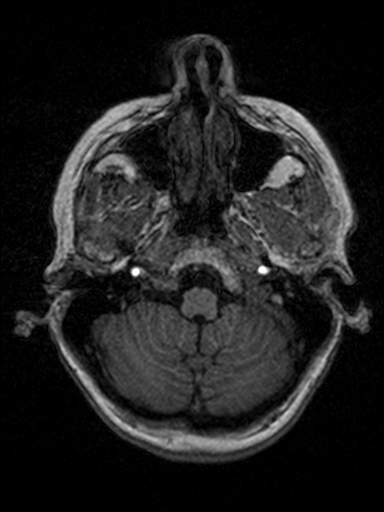
[im 37/160]
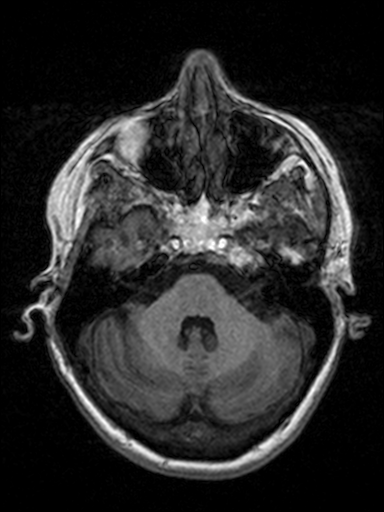
[im 49/160]
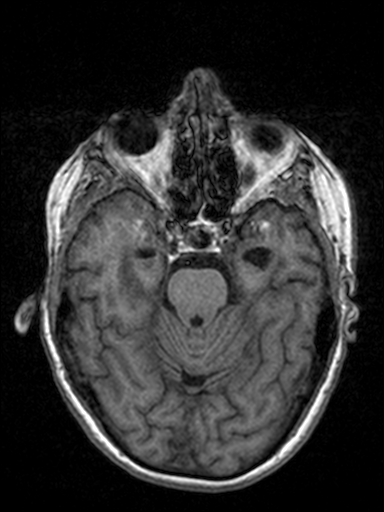
[im 74/160]
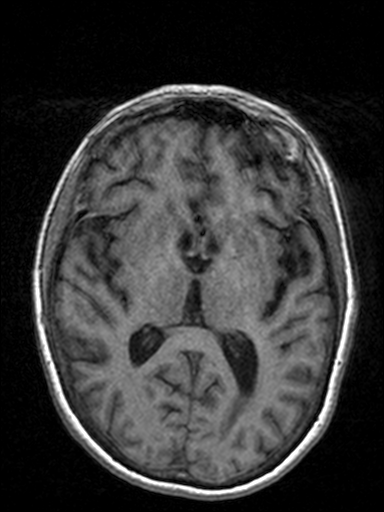
[im 86/160]
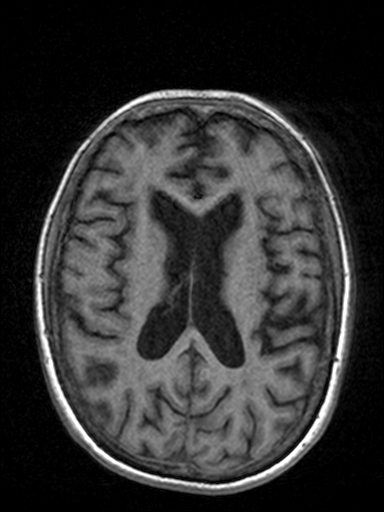
[im 111/160]
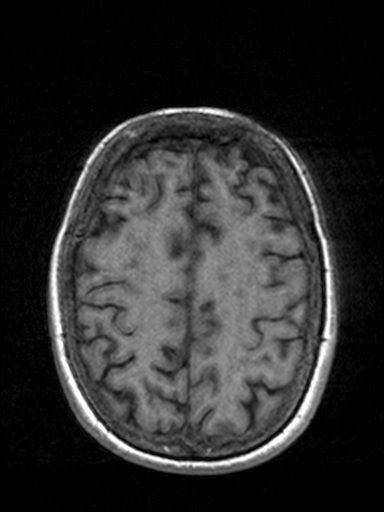
[im 135/160]
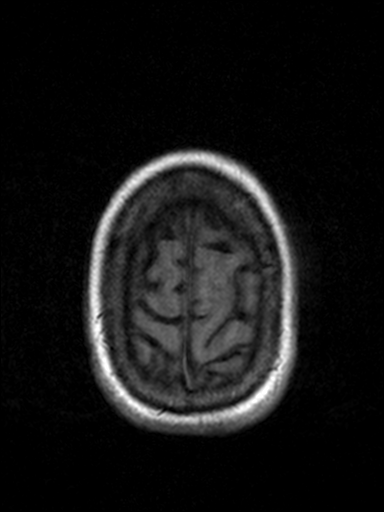
[im 160/160]
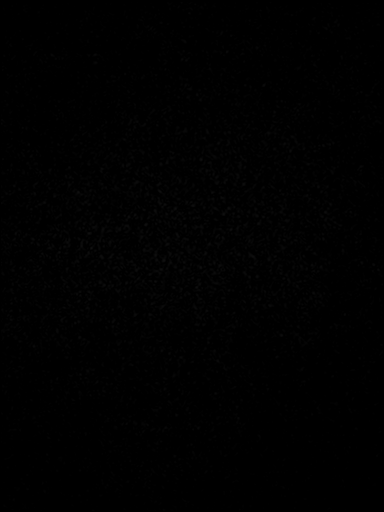

[Series 12: T2 · coronal · 5.0mm · 0.45mm/px · 3 of 36 slices shown (2 of 2)]
[im 1/36]
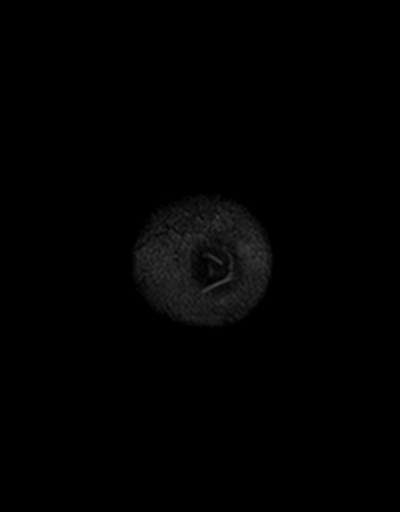
[im 18/36]
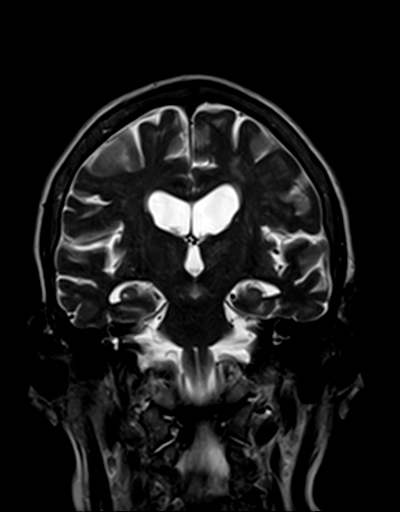
[im 36/36]
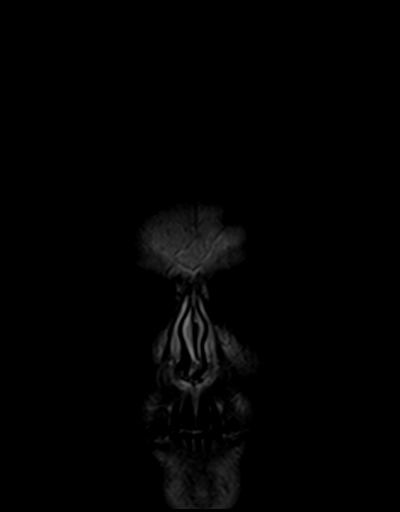

[44 of 48 positions shown; findings below may reference images not displayed]

FINDINGS: Brain: Mild generalized cerebral volume loss since 8302. No definite
disproportionate regions of cerebral atrophy.

No restricted diffusion to suggest acute infarction. No midline
shift, mass effect, evidence of mass lesion, ventriculomegaly,
extra-axial collection or acute intracranial hemorrhage.
Cervicomedullary junction and pituitary are within normal limits.

Chronic but increased scattered nonspecific cerebral white matter T2
and FLAIR hyperintensity, with a nodular appearance throughout
mostly the central and subcortical white matter. No cortical
encephalomalacia identified. No definite chronic cerebral blood
products. Deep gray matter nuclei, brainstem, and cerebellum appear
stable and within normal limits for age.

Vascular: Major intracranial vascular flow voids are stable and
within normal limits.

Skull and upper cervical spine: Negative. Normal bone marrow signal.

Sinuses/Orbits: Stable and negative orbits soft tissues. Increased
mild bilateral paranasal sinus mucosal thickening.

Other: Visible internal auditory structures appear normal. Mastoids
remain clear. Visible scalp and face soft tissues appear normal;
there is a small sebaceous cyst or focus of scalp susceptibility
artifact along the posterior right convexity on series 11, image
121).
IMPRESSION: 1.  No acute intracranial abnormality.
2. Some generalized cerebral volume loss since 8302, and progression
of moderately advanced but nonspecific cerebral white matter signal
changes which are most commonly due to chronic small vessel disease.

## 2019-09-09 DIAGNOSIS — G300 Alzheimer's disease with early onset: Secondary | ICD-10-CM | POA: Diagnosis not present

## 2019-09-09 DIAGNOSIS — E7800 Pure hypercholesterolemia, unspecified: Secondary | ICD-10-CM | POA: Diagnosis not present

## 2019-11-01 NOTE — Progress Notes (Signed)
PATIENT: [name] DOB: [birth year]  REASON FOR VISIT: follow up HISTORY FROM: patient  HISTORY OF PRESENT ILLNESS: Today 11/02/19  HISTORY  Atalee Calip a 74 years old female, seen in refer by primary care physician Dr. Shirline Frees, for evaluation of memory loss, initial evaluation was on July 24, 2017.  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  She had significant decline since summer 2018, more noticeable word finding difficulties, no longer cooking, her husband has taken financial management at home, she also quit driving a few months ago,  There was no family history of dementia  She has become much less active, tends to watch TV all day long  Laboratory evaluations June 2018, hemoglobin of 14.2, creatinine 0.6, normal TSH 2.39, B12 414  MRI of the brain in July 2018::Generalized atrophy, supratentorium small vessel disease  UPDATE October 22 2017:  Husband reported mild improvement with Namenda 10 mg twice a day, but she could not tolerate Aricept due to GI side effect, diarrhea, she also complains of recent onset of headaches, his right lower abdominal pain, has GI appointment pending,  Virtual visit on October 27, 2018, I was able to interview patient, with her husband present.  She is overall doing well with current medications Namenda 10 mg twice a day, Exelon patch 9.5 mg daily, she sleeps well, has good appetite  UPDATE May 04 2019: She is with her husband at today's clinical visit, Mini-Mental  Status Examination is 15 out of 30, she sleeps well, eats well,  Update, November 02, 2019 SS: Here today for follow-up accompanied by her husband.  MMSE was 11/30 today.  She remains on Namenda and Exelon patch.  Tolerating medications well.  Memory has gradually declined.  Overall, no change in function, but short term memory problems.  She is able to perform her own ADLs, requires some supervision with bathing.  She has a good appetite and sleeps well.  She enjoys walking, going to her grand kids ball games in [place], and watching TV.  No falls or wandering.  Her demeanor, overall pleasant, occasionally agitated, such as driving, thinks it takes longer to get places.   REVIEW OF SYSTEMS: Out of a complete 14 system review of symptoms, the patient complains only of the following symptoms, and all other reviewed systems are negative.  Memory loss  ALLERGIES: Allergies  Allergen Reactions  . Bupropion Other (See Comments)    Body aches   . Donepezil Diarrhea  . Melatonin Other (See Comments)    Shakes, nervous  . Sulfa Antibiotics Hives    HOME MEDICATIONS: Outpatient Medications Prior to Visit  Medication Sig Dispense Refill  . Ascorbic Acid (VITAMIN C PO) Take by mouth daily.    . B Complex-C (SUPER B COMPLEX PO) Take by mouth.    . cholecalciferol (VITAMIN D) 1000 UNITS tablet Take 1,000 Units by mouth daily.    . Cyanocobalamin (VITAMIN B-12 PO) Take by mouth daily.    . Ginkgo Biloba (GINKOBA PO) Take by mouth daily.    Marland Kitchen  memantine (NAMENDA) 10 MG tablet Take 1 tablet (10 mg total) by mouth 2 (two) times daily. 180 tablet 4  . multivitamin (THERAGRAN) per tablet Take 1 tablet by mouth daily.    . rivastigmine (EXELON) 13.3 MG/24HR Place 1 patch (13.3 mg total) onto the skin daily. 90 patch 4  . TURMERIC PO Take 1 capsule by mouth daily.    . vitamin E 100 UNIT capsule Take 100 Units by mouth daily.     No facility-administered medications prior to visit.    PAST MEDICAL  HISTORY: Past Medical History:  Diagnosis Date  . Anxiety   . Arthritis    lower back & knees  . Back pain   . Cataracts, bilateral   . Depression    takes Citalopram daily  . Encephalitis 1973  . Headache(784.0)    occasionally from a fall a month ago  . Hearing loss   . History of blood transfusion    no abnormal reaction noted  . Hypertension   . Insomnia   . Joint pain   . Joint swelling   . Memory loss   . Nocturia   . Osteoarthritis   . Rosacea     PAST SURGICAL HISTORY: Past Surgical History:  Procedure Laterality Date  . BACK SURGERY  2007   lumbar fusion w/ rods & screws- lower back  . COLONOSCOPY    . CYST EXCISION  2005   lumbar cyst  . JOINT REPLACEMENT  2009   left knee replacement  . MASS EXCISION  twice: 08/28/2011; 2012   Procedure: EXCISION MASS;  Surgeon: Wynonia Sours, MD;  Location: [facility];  Service: Orthopedics;  Laterality: Left;  excision mass left ring finger  . MASS EXCISION  02/11/2012   Procedure: EXCISION MASS;  Surgeon: Wynonia Sours, MD;  Location: [facility];  Service: Orthopedics;  Laterality: Left;  EXCISION MUCOID CYST, DEBRIDEMENT INTERPHALANGEAL JOINT LEFT THUMB  . mass removed from left ring finger  2013  . RETINAL DETACHMENT SURGERY Right   . SHOULDER ARTHROSCOPY WITH ROTATOR CUFF REPAIR AND SUBACROMIAL DECOMPRESSION Left 07/26/2013   Procedure: LEFT SHOULDER ARTHROSCOPY WITH /SUBACROMIAL DECOMPRESSION AND DISTAL CLAVICLE RESECTION;  Surgeon: Yvette Rack., MD;  Location: [facility];  Service: Orthopedics;  Laterality: Left;  . TOTAL KNEE ARTHROPLASTY Right 01/07/2014   Procedure: RIGHT TOTAL KNEE ARTHROPLASTY MEDIAL/LATERAL COMPARTMENTS WITH PATELLA RESURFACING;  Surgeon: Yvette Rack., MD;  Location: [facility];  Service: Orthopedics;  Laterality: Right;    FAMILY HISTORY: Family History  Problem Relation Age of Onset  . Pancreatic cancer Mother   . Alcohol abuse Father     SOCIAL  HISTORY: Social History   Socioeconomic History  . Marital status: Married    Spouse name: Not on file  . Number of children: 2  . Years of education: College  . Highest education level: Not on file  Occupational History  . Occupation: Retired  Tobacco Use  . Smoking status: Never Smoker  . Smokeless tobacco: Never Used  Substance and Sexual Activity  . Alcohol use: No    Alcohol/week: 0.0 standard drinks  . Drug use: No  . Sexual activity: Yes    Partners: Male    Birth control/protection: Post-menopausal  Other Topics Concern  . Not on file  Social History Narrative   Lives at home with her husband.   Left-handed.   No caffeine per day.   Social Determinants of Health  Financial Resource Strain:   . Difficulty of Paying Living Expenses:   Food Insecurity:   . Worried About Charity fundraiser in the Last Year:   . Arboriculturist in the Last Year:   Transportation Needs:   . Film/video editor (Medical):   Marland Kitchen Lack of Transportation (Non-Medical):   Physical Activity:   . Days of Exercise per Week:   . Minutes of Exercise per Session:   Stress:   . Feeling of Stress :   Social Connections:   . Frequency of Communication with Friends and Family:   . Frequency of Social Gatherings with Friends and Family:   . Attends Religious Services:   . Active Member of Clubs or Organizations:   . Attends Archivist Meetings:   Marland Kitchen Marital Status:   Intimate Partner Violence:   . Fear of Current or Ex-Partner:   . Emotionally Abused:   Marland Kitchen Physically Abused:   . Sexually Abused:     PHYSICAL EXAM  Vitals:   11/02/19 0934  BP: 128/60  Pulse: 92  Weight: 123 lb (55.8 kg)  Height: 5' (1.524 m)   Body mass index is 24.02 kg/m.  Generalized: Well developed, in no acute distress  MMSE - Mini Mental State Exam 11/02/2019 05/04/2019 04/23/2018  Orientation to time 1 0 0  Orientation to Place 3 4 2   Registration 3 3 3   Attention/ Calculation 0 0 4  Recall  0 0 1  Language- name 2 objects 1 1 1   Language- repeat 1 1 1   Language- follow 3 step command 1 3 1   Language- read & follow direction 1 1 1   Write a sentence 0 1 1  Copy design 0 1 1  Total score 11 15 16     Neurological examination  Mentation: Alert, most of history is provided by her husband.  She is smiling, engaged in visit.  Mild difficulty following exam commands. Cranial nerve II-XII: Pupils were equal round reactive to light. Extraocular movements were full, visual field were full on confrontational test. Facial sensation and strength were normal. Head turning and shoulder shrug  were normal and symmetric. Motor: The motor testing reveals 5 over 5 strength of all 4 extremities. Good symmetric motor tone is noted throughout.  Sensory: Sensory testing is intact to soft touch on all 4 extremities. No evidence of extinction is noted.  Coordination: Cerebellar testing reveals good finger-nose-finger and heel-to-shin bilaterally.  Gait and station: Gait is slightly wide-based, overall steady, no assistive device Reflexes: Deep tendon reflexes are symmetric and normal bilaterally.   DIAGNOSTIC DATA (LABS, IMAGING, TESTING) - I reviewed patient records, labs, notes, testing and imaging myself where available.  Lab Results  Component Value Date   WBC 10.2 01/10/2014   HGB 10.5 (L) 01/10/2014   HCT 31.7 (L) 01/10/2014   MCV 89.5 01/10/2014   PLT 179 01/10/2014      Component Value Date/Time   NA 140 01/10/2014 0041   K 4.6 01/10/2014 0041   CL 103 01/10/2014 0041   CO2 27 01/10/2014 0041   GLUCOSE 118 (H) 01/10/2014 0041   BUN 27 (H) 01/10/2014 0041   CREATININE 0.70 01/10/2014 0041   CALCIUM 8.2 (L) 01/10/2014 0041   PROT 7.3 12/28/2013 1135   ALBUMIN 3.8 12/28/2013 1135   AST 20 12/28/2013 1135   ALT 16 12/28/2013 1135   ALKPHOS 78 12/28/2013 1135   BILITOT 0.4 12/28/2013 1135   GFRNONAA 86 (L) 01/10/2014 0041  GFRAA >90 01/10/2014 0041   No results found for:  CHOL, HDL, LDLCALC, LDLDIRECT, TRIG, CHOLHDL No results found for: HGBA1C No results found for: VITAMINB12 No results found for: TSH    ASSESSMENT AND PLAN 74 y.o. year old female  has a past medical history of Anxiety, Arthritis, Back pain, Cataracts, bilateral, Depression, Encephalitis (1973), Headache(784.0), Hearing loss, History of blood transfusion, Hypertension, Insomnia, Joint pain, Joint swelling, Memory loss, Nocturia, Osteoarthritis, and Rosacea. here with:  1.  Dementia -MMSE 11/30 today, mild decline since last seen -Continue Namenda 10 mg twice a day -Continue Exelon patch 13.3 mg daily -Encouraged exercise and activity, brain stimulating exercises -Follow-up in 6 months or sooner if needed, discussed plan to transition follow-up with PCP   I spent 20 minutes of face-to-face and non-face-to-face time with patient.  This included previsit chart review, lab review, study review, order entry, electronic health record documentation, patient education.  Butler Denmark, AGNP-C, DNP 11/02/2019, 9:46 AM [facility] [address] [place], [place] [address] [phone]

## 2019-11-02 ENCOUNTER — Other Ambulatory Visit: Payer: Self-pay

## 2019-11-02 ENCOUNTER — Ambulatory Visit: Payer: PPO | Admitting: Neurology

## 2019-11-02 ENCOUNTER — Encounter: Payer: Self-pay | Admitting: Neurology

## 2019-11-02 VITALS — BP 128/60 | HR 92 | Ht 60.0 in | Wt 123.0 lb

## 2019-11-02 DIAGNOSIS — F0390 Unspecified dementia without behavioral disturbance: Secondary | ICD-10-CM

## 2019-11-02 MED ORDER — MEMANTINE HCL 10 MG PO TABS: 10.0000 mg | ORAL_TABLET | Freq: Two times a day (BID) | ORAL | 4 refills | Status: DC

## 2019-11-02 MED ORDER — RIVASTIGMINE 13.3 MG/24HR TD PT24: 13.3000 mg | MEDICATED_PATCH | Freq: Every day | TRANSDERMAL | 4 refills | Status: DC

## 2019-11-02 NOTE — Patient Instructions (Signed)
It was great to meet you today! Continue current medications Try to increase activity and exercise as tolerated  See you back in 6 months

## 2019-11-23 NOTE — Progress Notes (Signed)
I have reviewed and agreed above plan. 

## 2020-03-09 DIAGNOSIS — E7800 Pure hypercholesterolemia, unspecified: Secondary | ICD-10-CM | POA: Diagnosis not present

## 2020-03-09 DIAGNOSIS — G300 Alzheimer's disease with early onset: Secondary | ICD-10-CM | POA: Diagnosis not present

## 2020-05-03 ENCOUNTER — Ambulatory Visit: Payer: PPO | Admitting: Neurology

## 2020-06-19 ENCOUNTER — Encounter: Payer: Self-pay | Admitting: Neurology

## 2020-06-19 ENCOUNTER — Ambulatory Visit: Payer: PPO | Admitting: Neurology

## 2020-06-19 ENCOUNTER — Other Ambulatory Visit: Payer: Self-pay

## 2020-06-19 VITALS — BP 134/74 | HR 82 | Ht 60.0 in | Wt 125.5 lb

## 2020-06-19 DIAGNOSIS — F02818 Dementia in other diseases classified elsewhere, unspecified severity, with other behavioral disturbance: Secondary | ICD-10-CM

## 2020-06-19 DIAGNOSIS — F0281 Dementia in other diseases classified elsewhere with behavioral disturbance: Secondary | ICD-10-CM

## 2020-06-19 DIAGNOSIS — G301 Alzheimer's disease with late onset: Secondary | ICD-10-CM

## 2020-06-19 MED ORDER — QUETIAPINE FUMARATE 25 MG PO TABS
25.0000 mg | ORAL_TABLET | Freq: Every day | ORAL | 11 refills | Status: DC
Start: 2020-06-19 — End: 2020-12-18

## 2020-06-19 NOTE — Progress Notes (Signed)
HISTORY OF PRESENT ILLNESS: [name] a 74 years old female, seen in refer by primary care physician Dr. Shirline Frees, for evaluation of memory loss, initial evaluation was on July 24, 2017.  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  She had significant decline since summer 2018, more noticeable word finding difficulties, no longer cooking, her husband has taken financial management at home, she also quit driving a few months ago,  There was no family history of dementia  She has become much less active, tends to watch TV all day long  Laboratory evaluations June 2018, hemoglobin of 14.2, creatinine 0.6, normal TSH 2.39, B12 414  MRI of the brain in July 2018::Generalized atrophy, supratentorium small vessel disease  UPDATE October 22 2017:  Husband reported mild improvement with Namenda 10 mg twice a day, but she could not tolerate Aricept due to GI side effect, diarrhea, she also complains of recent onset of headaches, his right lower abdominal pain, has GI appointment pending,  Virtual visit on October 27, 2018, I was able to interview patient, with her husband present.  She is overall doing well with current medications Namenda 10 mg twice a day, Exelon patch 9.5 mg daily, she sleeps well, has good appetite  UPDATE May 04 2019: She is with her husband at today's clinical visit, Mini-Mental Status Examination is 15 out of 30, she sleeps well, eats well,  UPDATE Jun 19 2020: She is accompanied by her husband at  today's clinical visit, complains of agitation at evening time, using Ativan as needed couple times each months.  She sleeps well most of the time, has good appetite, no significant gait abnormality.  REVIEW OF SYSTEMS: Out of a complete 14 system review of symptoms, the patient complains only of the following symptoms, and all other reviewed systems are negative.  Memory loss  ALLERGIES: Allergies  Allergen Reactions  . Bupropion Other (See Comments)    Body aches   . Donepezil Diarrhea  . Melatonin Other (See Comments)    Shakes, nervous  . Sulfa Antibiotics Hives    HOME MEDICATIONS: Outpatient Medications Prior to Visit  Medication Sig Dispense Refill  . Ascorbic Acid (VITAMIN C PO) Take by mouth daily.    . B Complex-C (SUPER B COMPLEX PO) Take by mouth.    . cholecalciferol (VITAMIN D) 1000 UNITS tablet Take 1,000 Units by mouth daily.    . Cyanocobalamin (VITAMIN B-12 PO) Take by mouth daily.    . Ginkgo Biloba (GINKOBA PO) Take by mouth daily.    Marland Kitchen LORazepam (ATIVAN) 0.5 MG tablet Take 0.5 mg by mouth as needed.    . memantine (NAMENDA) 10 MG tablet Take 1 tablet (10 mg total) by mouth 2 (two) times daily. 180 tablet 4  . multivitamin (THERAGRAN) per tablet Take 1 tablet by mouth daily.    . rivastigmine (EXELON) 13.3 MG/24HR Place 1 patch (13.3 mg total) onto the skin daily. 90 patch 4  . TURMERIC PO Take 1 capsule by mouth daily.    . vitamin E  100 UNIT capsule Take 100 Units by mouth daily.     No facility-administered medications prior to visit.    PAST MEDICAL HISTORY: Past Medical History:  Diagnosis Date  . Anxiety   . Arthritis    lower back & knees  . Back pain   . Cataracts, bilateral   . Depression    takes Citalopram daily  . Encephalitis 1973  . Headache(784.0)    occasionally from a fall a month ago  . Hearing loss   . History of blood transfusion    no abnormal reaction noted  . Hypertension   . Insomnia   . Joint pain   . Joint swelling    . Memory loss   . Nocturia   . Osteoarthritis   . Rosacea     PAST SURGICAL HISTORY: Past Surgical History:  Procedure Laterality Date  . BACK SURGERY  2007   lumbar fusion w/ rods & screws- lower back  . COLONOSCOPY    . CYST EXCISION  2005   lumbar cyst  . JOINT REPLACEMENT  2009   left knee replacement  . MASS EXCISION  twice: 08/28/2011; 2012   Procedure: EXCISION MASS;  Surgeon: Wynonia Sours, MD;  Location: [facility];  Service: Orthopedics;  Laterality: Left;  excision mass left ring finger  . MASS EXCISION  02/11/2012   Procedure: EXCISION MASS;  Surgeon: Wynonia Sours, MD;  Location: [facility];  Service: Orthopedics;  Laterality: Left;  EXCISION MUCOID CYST, DEBRIDEMENT INTERPHALANGEAL JOINT LEFT THUMB  . mass removed from left ring finger  2013  . RETINAL DETACHMENT SURGERY Right   . SHOULDER ARTHROSCOPY WITH ROTATOR CUFF REPAIR AND SUBACROMIAL DECOMPRESSION Left 07/26/2013   Procedure: LEFT SHOULDER ARTHROSCOPY WITH /SUBACROMIAL DECOMPRESSION AND DISTAL CLAVICLE RESECTION;  Surgeon: Yvette Rack., MD;  Location: [facility];  Service: Orthopedics;  Laterality: Left;  . TOTAL KNEE ARTHROPLASTY Right 01/07/2014   Procedure: RIGHT TOTAL KNEE ARTHROPLASTY MEDIAL/LATERAL COMPARTMENTS WITH PATELLA RESURFACING;  Surgeon: Yvette Rack., MD;  Location: [facility];  Service: Orthopedics;  Laterality: Right;    FAMILY HISTORY: Family History  Problem Relation Age of Onset  . Pancreatic cancer Mother   . Alcohol abuse Father     SOCIAL HISTORY: Social History   Socioeconomic History  . Marital status: Married    Spouse name: Not on file  . Number of children: 2  . Years of education: College  . Highest education level: Not on file  Occupational History  . Occupation: Retired  Tobacco Use  . Smoking status: Never Smoker  . Smokeless tobacco: Never Used  Vaping Use  . Vaping Use: Never used  Substance and Sexual Activity  .  Alcohol use: No    Alcohol/week: 0.0 standard drinks  . Drug use: No  . Sexual activity: Yes    Partners: Male    Birth control/protection: Post-menopausal  Other Topics Concern  . Not on file  Social History Narrative   Lives at home with her husband.   Left-handed.   No caffeine per day.   Social Determinants of Health   Financial Resource Strain:   . Difficulty of Paying Living Expenses: Not on file  Food Insecurity:   . Worried About Charity fundraiser in the Last Year: Not on file  . Ran Out of Food in the Last Year: Not on file  Transportation Needs:   . Lack of Transportation (Medical): Not on file  .  Lack of Transportation (Non-Medical): Not on file  Physical Activity:   . Days of Exercise per Week: Not on file  . Minutes of Exercise per Session: Not on file  Stress:   . Feeling of Stress : Not on file  Social Connections:   . Frequency of Communication with Friends and Family: Not on file  . Frequency of Social Gatherings with Friends and Family: Not on file  . Attends Religious Services: Not on file  . Active Member of Clubs or Organizations: Not on file  . Attends Archivist Meetings: Not on file  . Marital Status: Not on file  Intimate Partner Violence:   . Fear of Current or Ex-Partner: Not on file  . Emotionally Abused: Not on file  . Physically Abused: Not on file  . Sexually Abused: Not on file    PHYSICAL EXAM  Vitals:   06/19/20 0738  BP: 134/74  Pulse: 82  Weight: 125 lb 8 oz (56.9 kg)  Height: 5' (1.524 m)   Body mass index is 24.51 kg/m.  Generalized: Well developed, in no acute distress  MMSE - Mini Mental State Exam 11/02/2019 05/04/2019 04/23/2018  Orientation to time 1 0 0  Orientation to Place 3 4 2   Registration 3 3 3   Attention/ Calculation 0 0 4  Recall 0 0 1  Language- name 2 objects 1 1 1   Language- repeat 1 1 1   Language- follow 3 step command 1 3 1   Language- read & follow direction 1 1 1   Write a sentence 0 1  1  Copy design 0 1 1  Total score 11 15 16     Neurological examination  Mentation: Alert, quiet, follow commands, rely on her husband to provide history Cranial nerve II-XII: Pupils were equal round reactive to light. Extraocular movements were full, visual field were full on confrontational test. Facial sensation and strength were normal. Head turning and shoulder shrug  were normal and symmetric. Motor: Normal muscle tone, strength, Coordination: Cerebellar testing reveals good finger-nose-finger and heel-to-shin bilaterally.  Gait and station: Able to get up from seated position arm crossed Reflexes: Deep tendon reflexes are symmetric and normal bilaterally.   DIAGNOSTIC DATA (LABS, IMAGING, TESTING) - I reviewed patient records, labs, notes, testing and imaging myself where available.  Lab Results  Component Value Date   WBC 10.2 01/10/2014   HGB 10.5 (L) 01/10/2014   HCT 31.7 (L) 01/10/2014   MCV 89.5 01/10/2014   PLT 179 01/10/2014      Component Value Date/Time   NA 140 01/10/2014 0041   K 4.6 01/10/2014 0041   CL 103 01/10/2014 0041   CO2 27 01/10/2014 0041   GLUCOSE 118 (H) 01/10/2014 0041   BUN 27 (H) 01/10/2014 0041   CREATININE 0.70 01/10/2014 0041   CALCIUM 8.2 (L) 01/10/2014 0041   PROT 7.3 12/28/2013 1135   ALBUMIN 3.8 12/28/2013 1135   AST 20 12/28/2013 1135   ALT 16 12/28/2013 1135   ALKPHOS 78 12/28/2013 1135   BILITOT 0.4 12/28/2013 1135   GFRNONAA 86 (L) 01/10/2014 0041   GFRAA >90 01/10/2014 0041   ASSESSMENT AND PLAN 74 y.o. year old female   Dementia with agitation  Slow decline over time  Continue Namenda 10 mg twice a day  Continue Exelon patch 13.3 mg daily  Seroquel 25 mg every night  Ativan as needed  Marcial Pacas, M.D. Ph.D.  [facility] [place], [place] [address] Phone: [phone] Fax:  [phone]  

## 2020-09-12 DIAGNOSIS — L820 Inflamed seborrheic keratosis: Secondary | ICD-10-CM | POA: Diagnosis not present

## 2020-10-24 DIAGNOSIS — G300 Alzheimer's disease with early onset: Secondary | ICD-10-CM | POA: Diagnosis not present

## 2020-11-28 ENCOUNTER — Other Ambulatory Visit: Payer: Self-pay | Admitting: Neurology

## 2020-12-18 ENCOUNTER — Encounter: Payer: Self-pay | Admitting: Neurology

## 2020-12-18 ENCOUNTER — Ambulatory Visit: Payer: PPO | Admitting: Neurology

## 2020-12-18 VITALS — BP 128/63 | HR 64 | Ht 60.0 in | Wt 119.0 lb

## 2020-12-18 DIAGNOSIS — G301 Alzheimer's disease with late onset: Secondary | ICD-10-CM | POA: Diagnosis not present

## 2020-12-18 DIAGNOSIS — F0281 Dementia in other diseases classified elsewhere with behavioral disturbance: Secondary | ICD-10-CM | POA: Diagnosis not present

## 2020-12-18 MED ORDER — MEMANTINE HCL 10 MG PO TABS: 10.0000 mg | ORAL_TABLET | Freq: Two times a day (BID) | ORAL | 4 refills | Status: DC

## 2020-12-18 MED ORDER — LORAZEPAM 0.5 MG PO TABS: 0.5000 mg | ORAL_TABLET | ORAL | 3 refills | Status: DC | PRN

## 2020-12-18 MED ORDER — QUETIAPINE FUMARATE 25 MG PO TABS: 25.0000 mg | ORAL_TABLET | Freq: Every day | ORAL | 1 refills | Status: DC

## 2020-12-18 NOTE — Progress Notes (Signed)
HISTORY OF PRESENT ILLNESS: [name] a 75 years old female, seen in refer by primary care physician Dr. Shirline Frees, for evaluation of memory loss, initial evaluation was on July 24, 2017.  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  She had significant decline since summer 2018, more noticeable word finding difficulties, no longer cooking, her husband has taken financial management at home, she also quit driving a few months ago,  There was no family history of dementia  She has become much less active, tends to watch TV all day long  Laboratory evaluations June 2018, hemoglobin of 14.2, creatinine 0.6, normal TSH 2.39, B12 414  MRI of the brain in July 2018::Generalized atrophy, supratentorium small vessel disease  UPDATE October 22 2017:  Husband reported mild improvement with Namenda 10 mg twice a day, but she could not tolerate Aricept due to GI side effect, diarrhea, she also complains of recent onset of headaches, his right lower abdominal pain, has GI appointment pending,  Virtual visit on October 27, 2018, I was able to interview patient, with her husband present.  She is overall doing well with current medications Namenda 10 mg twice a day, Exelon patch 9.5 mg daily, she sleeps well, has good appetite  UPDATE May 04 2019: She is with her husband at today's clinical visit, Mini-Mental Status Examination is 15 out of 30, she sleeps well, eats well,  UPDATE Jun 19 2020: She is accompanied by her husband at  today's clinical visit, complains of agitation at evening time, using Ativan as needed couple times each months.  She sleeps well most of the time, has good appetite, no significant gait abnormality.  Update December 18, 2020 SS: Here today with husband, lately more agitated in the early morning, especially when prompted with activity guidance. Rarely gives Seroquel 25 mg at bedtime, Ativan. Sleeping well, no falls, good appetite. Go to [employer] in [place] twice monthly, support meetings. Isn't left alone. Go on walks. They went to [facility] yesterday for grandson's graduation. She enjoys singing, reading. No changes to medical history.   REVIEW OF SYSTEMS: Out of a complete 14 system review of symptoms, the patient complains only of the following symptoms, and all other reviewed systems are negative.  Memory loss  ALLERGIES: Allergies  Allergen Reactions  . Bupropion Other (See Comments)    Body aches   . Donepezil Diarrhea  . Melatonin Other (See Comments)    Shakes, nervous  . Sulfa Antibiotics Hives    HOME MEDICATIONS: Outpatient Medications Prior to Visit  Medication Sig Dispense Refill  . Ascorbic Acid (VITAMIN C PO) Take by mouth daily.    . B Complex-C (SUPER B COMPLEX PO) Take by mouth.    . cholecalciferol (VITAMIN D) 1000 UNITS tablet Take 1,000 Units by mouth daily.    . Cyanocobalamin (VITAMIN B-12 PO) Take by mouth daily.    . Ginkgo Biloba (GINKOBA PO) Take by mouth daily.    Marland Kitchen LORazepam (ATIVAN) 0.5 MG tablet Take 0.5 mg by mouth as needed.    Marland Kitchen  memantine (NAMENDA) 10 MG tablet Take 1 tablet (10 mg total) by mouth 2 (two) times daily. 180 tablet 4  . multivitamin (THERAGRAN) per tablet Take 1 tablet by mouth daily.    . QUEtiapine (SEROQUEL) 25 MG tablet Take 1 tablet (25 mg total) by mouth at bedtime. 30 tablet 11  . rivastigmine (EXELON) 13.3 MG/24HR APPLY 1 PATCH TOPICALLY ONCE DAILY 90 patch 3  . TURMERIC PO Take 1 capsule by mouth daily.    . vitamin E 100 UNIT  capsule Take 100 Units by mouth daily.     No facility-administered medications prior to visit.    PAST MEDICAL HISTORY: Past Medical History:  Diagnosis Date  . Anxiety   . Arthritis    lower back & knees  . Back pain   . Cataracts, bilateral   . Depression    takes Citalopram daily  . Encephalitis 1973  . Headache(784.0)    occasionally from a fall a month ago  . Hearing loss   . History of blood transfusion    no abnormal reaction noted  . Hypertension   . Insomnia   . Joint pain   . Joint swelling   . Memory loss   . Nocturia   . Osteoarthritis   . Rosacea     PAST SURGICAL HISTORY: Past Surgical History:  Procedure Laterality Date  . BACK SURGERY  2007   lumbar fusion w/ rods & screws- lower back  . COLONOSCOPY    . CYST EXCISION  2005   lumbar cyst  . JOINT REPLACEMENT  2009   left knee replacement  . MASS EXCISION  twice: 08/28/2011; 2012   Procedure: EXCISION MASS;  Surgeon: Wynonia Sours, MD;  Location: [facility];  Service: Orthopedics;  Laterality: Left;  excision mass left ring finger  . MASS EXCISION  02/11/2012   Procedure: EXCISION MASS;  Surgeon: Wynonia Sours, MD;  Location: [employer] [facility];  Service: Orthopedics;  Laterality: Left;  EXCISION MUCOID CYST, DEBRIDEMENT INTERPHALANGEAL JOINT LEFT THUMB  . mass removed from left ring finger  2013  . RETINAL DETACHMENT SURGERY Right   . SHOULDER ARTHROSCOPY WITH ROTATOR CUFF REPAIR AND SUBACROMIAL DECOMPRESSION Left 07/26/2013   Procedure: LEFT SHOULDER ARTHROSCOPY WITH /SUBACROMIAL DECOMPRESSION AND DISTAL CLAVICLE RESECTION;  Surgeon: Yvette Rack., MD;  Location: [facility];  Service: Orthopedics;  Laterality: Left;  . TOTAL KNEE ARTHROPLASTY Right 01/07/2014   Procedure: RIGHT TOTAL KNEE ARTHROPLASTY MEDIAL/LATERAL COMPARTMENTS WITH PATELLA RESURFACING;  Surgeon: Yvette Rack., MD;  Location: [facility];  Service: Orthopedics;  Laterality: Right;    FAMILY  HISTORY: Family History  Problem Relation Age of Onset  . Pancreatic cancer Mother   . Alcohol abuse Father     SOCIAL HISTORY: Social History   Socioeconomic History  . Marital status: Married    Spouse name: Not on file  . Number of children: 2  . Years of education: College  . Highest education level: Not on file  Occupational History  . Occupation: Retired  Tobacco Use  . Smoking status: Never Smoker  . Smokeless tobacco: Never Used  Vaping Use  . Vaping Use: Never used  Substance and Sexual Activity  . Alcohol use: No    Alcohol/week: 0.0 standard drinks  . Drug use: No  . Sexual activity: Yes    Partners: Male    Birth control/protection: Post-menopausal  Other Topics Concern  . Not on file  Social History Narrative  Lives at home with her husband.   Left-handed.   No caffeine per day.   Social Determinants of Health   Financial Resource Strain: Not on file  Food Insecurity: Not on file  Transportation Needs: Not on file  Physical Activity: Not on file  Stress: Not on file  Social Connections: Not on file  Intimate Partner Violence: Not on file    PHYSICAL EXAM  Vitals:   12/18/20 1245  BP: 128/63  Pulse: 64  Weight: 119 lb (54 kg)  Height: 5' (1.524 m)   Body mass index is 23.24 kg/m.  Generalized: Well developed, in no acute distress  MMSE - Mini Mental State Exam 11/02/2019 05/04/2019 04/23/2018  Orientation to time 1 0 0  Orientation to Place 3 4 2   Registration 3 3 3   Attention/ Calculation 0 0 4  Recall 0 0 1  Language- name 2 objects 1 1 1   Language- repeat 1 1 1   Language- follow 3 step command 1 3 1   Language- read & follow direction 1 1 1   Write a sentence 0 1 1  Copy design 0 1 1  Total score 11 15 16     Neurological examination  Mentation: Alert, moderate difficulty following exam commands, history is provided by her husband, disoriented to DOB, place, time Cranial nerve II-XII: Pupils were equal round reactive to light.  Extraocular movements were full, visual field were full on confrontational test. Facial sensation and strength were normal. Head turning and shoulder shrug  were normal and symmetric. Motor: Normal muscle tone, strength, Coordination: slow to follow exam commands Gait and station: Gait is steady, slightly wide-based, independent Reflexes: Deep tendon reflexes are symmetric and normal bilaterally.   DIAGNOSTIC DATA (LABS, IMAGING, TESTING) - I reviewed patient records, labs, notes, testing and imaging myself where available.  Lab Results  Component Value Date   WBC 10.2 01/10/2014   HGB 10.5 (L) 01/10/2014   HCT 31.7 (L) 01/10/2014   MCV 89.5 01/10/2014   PLT 179 01/10/2014      Component Value Date/Time   NA 140 01/10/2014 0041   K 4.6 01/10/2014 0041   CL 103 01/10/2014 0041   CO2 27 01/10/2014 0041   GLUCOSE 118 (H) 01/10/2014 0041   BUN 27 (H) 01/10/2014 0041   CREATININE 0.70 01/10/2014 0041   CALCIUM 8.2 (L) 01/10/2014 0041   PROT 7.3 12/28/2013 1135   ALBUMIN 3.8 12/28/2013 1135   AST 20 12/28/2013 1135   ALT 16 12/28/2013 1135   ALKPHOS 78 12/28/2013 1135   BILITOT 0.4 12/28/2013 1135   GFRNONAA 86 (L) 01/10/2014 0041   GFRAA >90 01/10/2014 0041   ASSESSMENT AND PLAN 75 y.o. year old female   1. Dementia with agitation  -Relies on her husband for history, overall pleasant today -Continue Namenda 10 mg twice a day -Continue Exelon patch 13.3 mg daily -Continue Seroquel 25 mg, take 1/2-1 tablet at bedtime to help with agitation -Continue Ativan 0.5 mg as needed for agitation during the day, refill sent, last fill was 03/24/20, husband rarely give this -Follow-up in 8 months or sooner if needed  I spent 32 minutes of face-to-face and non-face-to-face time with patient.  This included previsit chart review, discussing the memory, medications, how to best handle agitation, community resources, follow-up.    Evangeline Dakin, DNP  [facility] [address], [place], [place] [address] [phone]

## 2020-12-18 NOTE — Patient Instructions (Signed)
Give Seroquel 25 mg, 1/2 tablet to 1 tablet every night to help with mood Use Ativan as needed during the day for agitation  Continue Exelon patch and Namenda tablet  Follow-up in 8 months

## 2020-12-29 DIAGNOSIS — J40 Bronchitis, not specified as acute or chronic: Secondary | ICD-10-CM | POA: Diagnosis not present

## 2021-03-15 DIAGNOSIS — I10 Essential (primary) hypertension: Secondary | ICD-10-CM | POA: Diagnosis not present

## 2021-03-15 DIAGNOSIS — Z0000 Encounter for general adult medical examination without abnormal findings: Secondary | ICD-10-CM | POA: Diagnosis not present

## 2021-03-15 DIAGNOSIS — G300 Alzheimer's disease with early onset: Secondary | ICD-10-CM | POA: Diagnosis not present

## 2021-03-15 DIAGNOSIS — Z23 Encounter for immunization: Secondary | ICD-10-CM | POA: Diagnosis not present

## 2021-03-15 DIAGNOSIS — E7800 Pure hypercholesterolemia, unspecified: Secondary | ICD-10-CM | POA: Diagnosis not present

## 2021-04-11 DIAGNOSIS — M170 Bilateral primary osteoarthritis of knee: Secondary | ICD-10-CM | POA: Diagnosis not present

## 2021-04-11 DIAGNOSIS — F0280 Dementia in other diseases classified elsewhere without behavioral disturbance: Secondary | ICD-10-CM | POA: Diagnosis not present

## 2021-04-11 DIAGNOSIS — M1990 Unspecified osteoarthritis, unspecified site: Secondary | ICD-10-CM | POA: Diagnosis not present

## 2021-04-11 DIAGNOSIS — G300 Alzheimer's disease with early onset: Secondary | ICD-10-CM | POA: Diagnosis not present

## 2021-04-11 DIAGNOSIS — E7800 Pure hypercholesterolemia, unspecified: Secondary | ICD-10-CM | POA: Diagnosis not present

## 2021-04-17 DIAGNOSIS — G300 Alzheimer's disease with early onset: Secondary | ICD-10-CM | POA: Diagnosis not present

## 2021-04-17 DIAGNOSIS — M1990 Unspecified osteoarthritis, unspecified site: Secondary | ICD-10-CM | POA: Diagnosis not present

## 2021-04-17 DIAGNOSIS — F0280 Dementia in other diseases classified elsewhere without behavioral disturbance: Secondary | ICD-10-CM | POA: Diagnosis not present

## 2021-04-17 DIAGNOSIS — E7800 Pure hypercholesterolemia, unspecified: Secondary | ICD-10-CM | POA: Diagnosis not present

## 2021-04-17 DIAGNOSIS — M170 Bilateral primary osteoarthritis of knee: Secondary | ICD-10-CM | POA: Diagnosis not present

## 2021-06-12 DIAGNOSIS — G300 Alzheimer's disease with early onset: Secondary | ICD-10-CM | POA: Diagnosis not present

## 2021-06-12 DIAGNOSIS — E7800 Pure hypercholesterolemia, unspecified: Secondary | ICD-10-CM | POA: Diagnosis not present

## 2021-06-12 DIAGNOSIS — F0280 Dementia in other diseases classified elsewhere without behavioral disturbance: Secondary | ICD-10-CM | POA: Diagnosis not present

## 2021-06-12 DIAGNOSIS — M1990 Unspecified osteoarthritis, unspecified site: Secondary | ICD-10-CM | POA: Diagnosis not present

## 2021-06-12 DIAGNOSIS — M170 Bilateral primary osteoarthritis of knee: Secondary | ICD-10-CM | POA: Diagnosis not present

## 2021-06-26 ENCOUNTER — Telehealth: Payer: Self-pay

## 2021-06-26 NOTE — Telephone Encounter (Signed)
Attempted to contact patient's husband [name] to schedule a Palliative Care consult appointment. No answer left a message to return call.

## 2021-06-27 ENCOUNTER — Telehealth: Payer: Self-pay

## 2021-06-27 NOTE — Telephone Encounter (Signed)
Spoke with patient's husband [name] and scheduled an in-person Palliative Consult for 07/17/2021 @ 1 PM with Dr. Hollace Kinnier. Documentation will be noted in [facility].  COVID screening was negative. No pets in home. Patient lives with husband.  Consent obtained; updated Outlook/Netsmart/Team List and Epic.   Family is aware they may be receiving a call from provider the day before or day of to confirm appointment.

## 2021-07-31 DIAGNOSIS — Z515 Encounter for palliative care: Secondary | ICD-10-CM | POA: Diagnosis not present

## 2021-07-31 DIAGNOSIS — F02818 Dementia in other diseases classified elsewhere, unspecified severity, with other behavioral disturbance: Secondary | ICD-10-CM | POA: Diagnosis not present

## 2021-08-06 DIAGNOSIS — G300 Alzheimer's disease with early onset: Secondary | ICD-10-CM | POA: Diagnosis not present

## 2021-08-06 DIAGNOSIS — E7800 Pure hypercholesterolemia, unspecified: Secondary | ICD-10-CM | POA: Diagnosis not present

## 2021-08-06 DIAGNOSIS — M170 Bilateral primary osteoarthritis of knee: Secondary | ICD-10-CM | POA: Diagnosis not present

## 2021-08-22 NOTE — Progress Notes (Deleted)
HISTORY OF PRESENT ILLNESS: [name] is a 76 years old female, seen in refer by primary care physician Dr. Shirline Frees, for evaluation of memory loss, initial evaluation was on July 24, 2017.   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   She had significant decline since summer 2018, more noticeable word finding difficulties, no longer cooking, her husband has taken financial management at home, she also quit driving a few months ago,   There was no family history of dementia   She has become much less active, tends to watch TV all day long   Laboratory evaluations June 2018, hemoglobin of 14.2, creatinine 0.6, normal TSH 2.39, B12 414   MRI of the brain in July 2018::Generalized atrophy, supratentorium small vessel disease   UPDATE October 22 2017:  Husband reported mild improvement with Namenda 10 mg twice a day, but she could not tolerate Aricept due to GI side effect, diarrhea, she also complains of recent onset of headaches, his right lower abdominal pain, has GI appointment pending,   Virtual visit on October 27, 2018, I was able to interview patient, with her husband present.  She is overall doing well with current medications Namenda 10 mg twice a day, Exelon patch 9.5 mg daily, she sleeps well, has good appetite   UPDATE May 04 2019: She is with her husband at today's clinical visit, Mini-Mental Status Examination is 15 out of 30, she sleeps well, eats well,  UPDATE Jun 19 2020: She is accompanied by her husband at  today's clinical visit, complains of agitation at evening time, using Ativan as needed couple times each months.  She sleeps well most of the time, has good appetite, no significant gait abnormality.  Update December 18, 2020 SS: Here today with husband, lately more agitated in the early morning, especially when prompted with activity guidance. Rarely gives Seroquel 25 mg at bedtime, Ativan. Sleeping well, no falls, good appetite. Go to [employer] in [place] twice monthly, support meetings. Isn't left alone. Go on walks. They went to [facility] yesterday for grandson's graduation. She enjoys singing, reading. No changes to medical history.   Update August 23, 2021 SS:   REVIEW OF SYSTEMS: Out of a complete 14 system review of symptoms, the patient complains only of the following symptoms, and all other reviewed systems are negative.  Memory loss  ALLERGIES: Allergies  Allergen Reactions   Bupropion Other (See Comments)    Body aches    Donepezil Diarrhea   Melatonin Other (See Comments)    Shakes, nervous   Sulfa Antibiotics Hives    HOME MEDICATIONS: Outpatient Medications Prior to Visit  Medication Sig Dispense Refill   Ascorbic Acid (VITAMIN C PO) Take by mouth daily.     B Complex-C (SUPER B COMPLEX PO) Take by mouth.     cholecalciferol (VITAMIN D) 1000 UNITS tablet Take 1,000 Units by mouth daily.     Cyanocobalamin (VITAMIN B-12 PO) Take by mouth daily.     Ginkgo Biloba (GINKOBA PO) Take by mouth  daily.     LORazepam (ATIVAN) 0.5 MG tablet Take 1 tablet (0.5 mg total) by mouth as needed. 30 tablet 3   memantine (NAMENDA) 10 MG tablet Take 1 tablet (10 mg total) by mouth 2 (two) times daily. 180 tablet 4   multivitamin (THERAGRAN) per tablet Take 1 tablet by mouth daily.     QUEtiapine (SEROQUEL) 25 MG tablet Take 1 tablet (25 mg total) by mouth at bedtime. 90 tablet 1   rivastigmine (EXELON) 13.3 MG/24HR APPLY 1 PATCH TOPICALLY ONCE DAILY 90 patch 3   TURMERIC PO Take 1  capsule by mouth daily.     vitamin E 100 UNIT capsule Take 100 Units by mouth daily.     No facility-administered medications prior to visit.    PAST MEDICAL HISTORY: Past Medical History:  Diagnosis Date   Anxiety    Arthritis    lower back & knees   Back pain    Cataracts, bilateral    Depression    takes Citalopram daily   Encephalitis 1973   Headache(784.0)    occasionally from a fall a month ago   Hearing loss    History of blood transfusion    no abnormal reaction noted   Hypertension    Insomnia    Joint pain    Joint swelling    Memory loss    Nocturia    Osteoarthritis    Rosacea     PAST SURGICAL HISTORY: Past Surgical History:  Procedure Laterality Date   BACK SURGERY  2007   lumbar fusion w/ rods & screws- lower back   COLONOSCOPY     CYST EXCISION  2005   lumbar cyst   JOINT REPLACEMENT  2009   left knee replacement   MASS EXCISION  twice: 08/28/2011; 2012   Procedure: EXCISION MASS;  Surgeon: Wynonia Sours, MD;  Location: [facility];  Service: Orthopedics;  Laterality: Left;  excision mass left ring finger   MASS EXCISION  02/11/2012   Procedure: EXCISION MASS;  Surgeon: Wynonia Sours, MD;  Location: [facility];  Service: Orthopedics;  Laterality: Left;  EXCISION MUCOID CYST, DEBRIDEMENT INTERPHALANGEAL JOINT LEFT THUMB   mass removed from left ring finger  2013   RETINAL DETACHMENT SURGERY Right    SHOULDER ARTHROSCOPY WITH ROTATOR CUFF REPAIR AND SUBACROMIAL DECOMPRESSION Left 07/26/2013   Procedure: LEFT SHOULDER ARTHROSCOPY WITH /SUBACROMIAL DECOMPRESSION AND DISTAL CLAVICLE RESECTION;  Surgeon: Yvette Rack., MD;  Location: [facility];  Service: Orthopedics;  Laterality: Left;   TOTAL KNEE ARTHROPLASTY Right 01/07/2014   Procedure: RIGHT TOTAL KNEE ARTHROPLASTY MEDIAL/LATERAL COMPARTMENTS WITH PATELLA RESURFACING;  Surgeon: Yvette Rack., MD;  Location: [facility];  Service: Orthopedics;  Laterality: Right;     FAMILY HISTORY: Family History  Problem Relation Age of Onset   Pancreatic cancer Mother    Alcohol abuse Father     SOCIAL HISTORY: Social History   Socioeconomic History   Marital status: Married    Spouse name: Not on file   Number of children: 2   Years of education: College   Highest education level: Not on file  Occupational History   Occupation: Retired  Tobacco Use   Smoking status: Never   Smokeless tobacco: Never  Vaping Use   Vaping Use: Never used  Substance and Sexual Activity   Alcohol use: No    Alcohol/week: 0.0 standard drinks   Drug use: No   Sexual activity: Yes  Partners: Male    Birth control/protection: Post-menopausal  Other Topics Concern   Not on file  Social History Narrative   Lives at home with her husband.   Left-handed.   No caffeine per day.   Social Determinants of Health   Financial Resource Strain: Not on file  Food Insecurity: Not on file  Transportation Needs: Not on file  Physical Activity: Not on file  Stress: Not on file  Social Connections: Not on file  Intimate Partner Violence: Not on file    PHYSICAL EXAM  There were no vitals filed for this visit.  There is no height or weight on file to calculate BMI.  Generalized: Well developed, in no acute distress  MMSE - Mini Mental State Exam 11/02/2019 05/04/2019 04/23/2018  Orientation to time 1 0 0  Orientation to Place 3 4 2   Registration 3 3 3   Attention/ Calculation 0 0 4  Recall 0 0 1  Language- name 2 objects 1 1 1   Language- repeat 1 1 1   Language- follow 3 step command 1 3 1   Language- read & follow direction 1 1 1   Write a sentence 0 1 1  Copy design 0 1 1  Total score 11 15 16     Neurological examination  Mentation: Alert, moderate difficulty following exam commands, history is provided by her husband, disoriented to DOB, place, time Cranial nerve II-XII: Pupils were equal round reactive to light. Extraocular movements were full, visual field  were full on confrontational test. Facial sensation and strength were normal. Head turning and shoulder shrug  were normal and symmetric. Motor: Normal muscle tone, strength, Coordination: slow to follow exam commands Gait and station: Gait is steady, slightly wide-based, independent Reflexes: Deep tendon reflexes are symmetric and normal bilaterally.   DIAGNOSTIC DATA (LABS, IMAGING, TESTING) - I reviewed patient records, labs, notes, testing and imaging myself where available.  Lab Results  Component Value Date   WBC 10.2 01/10/2014   HGB 10.5 (L) 01/10/2014   HCT 31.7 (L) 01/10/2014   MCV 89.5 01/10/2014   PLT 179 01/10/2014      Component Value Date/Time   NA 140 01/10/2014 0041   K 4.6 01/10/2014 0041   CL 103 01/10/2014 0041   CO2 27 01/10/2014 0041   GLUCOSE 118 (H) 01/10/2014 0041   BUN 27 (H) 01/10/2014 0041   CREATININE 0.70 01/10/2014 0041   CALCIUM 8.2 (L) 01/10/2014 0041   PROT 7.3 12/28/2013 1135   ALBUMIN 3.8 12/28/2013 1135   AST 20 12/28/2013 1135   ALT 16 12/28/2013 1135   ALKPHOS 78 12/28/2013 1135   BILITOT 0.4 12/28/2013 1135   GFRNONAA 86 (L) 01/10/2014 0041   GFRAA >90 01/10/2014 0041   ASSESSMENT AND PLAN 76 y.o. year old female   1. Dementia with agitation  -Relies on her husband for history, overall pleasant today -Continue Namenda 10 mg twice a day -Continue Exelon patch 13.3 mg daily -Continue Seroquel 25 mg, take 1/2-1 tablet at bedtime to help with agitation -Continue Ativan 0.5 mg as needed for agitation during the day, refill sent, last fill was 03/24/20, husband rarely give this -Follow-up in 8 months or sooner if needed     Evangeline Dakin, [facility] [address] [place], [place] [address] [phone]

## 2021-08-23 ENCOUNTER — Ambulatory Visit: Payer: PPO | Admitting: Neurology

## 2021-09-25 ENCOUNTER — Other Ambulatory Visit: Payer: Self-pay

## 2021-09-25 ENCOUNTER — Other Ambulatory Visit: Payer: PPO | Admitting: Internal Medicine

## 2021-09-25 ENCOUNTER — Encounter: Payer: Self-pay | Admitting: Internal Medicine

## 2021-09-25 DIAGNOSIS — F411 Generalized anxiety disorder: Secondary | ICD-10-CM | POA: Insufficient documentation

## 2021-09-25 DIAGNOSIS — F419 Anxiety disorder, unspecified: Secondary | ICD-10-CM | POA: Insufficient documentation

## 2021-09-25 DIAGNOSIS — G301 Alzheimer's disease with late onset: Secondary | ICD-10-CM | POA: Diagnosis not present

## 2021-09-25 DIAGNOSIS — F02818 Dementia in other diseases classified elsewhere, unspecified severity, with other behavioral disturbance: Secondary | ICD-10-CM

## 2021-09-25 DIAGNOSIS — E7800 Pure hypercholesterolemia, unspecified: Secondary | ICD-10-CM | POA: Insufficient documentation

## 2021-09-25 DIAGNOSIS — Z515 Encounter for palliative care: Secondary | ICD-10-CM | POA: Diagnosis not present

## 2021-09-25 NOTE — Progress Notes (Signed)
[facility]   Palliative Care?Follow-Up?Note   Telephone: [phone]    Fax: [phone]    ?   Date of encounter:?09/25/21   PATIENT NAME: ?[name]   DOB:??06/13/2046   Location of Service: ?Home   PRIMARY CARE PROVIDER: ?Dr. Shirline Frees    REFERRING PROVIDER: ?Dr. Shirline Frees   RESPONSIBLE PARTY: ??Husband, Jim--Okuda,James (Spouse)   [phone] (Mobile)   ?   BILLABLE ICD-10:    Alzheimer's disease with behavioral disturbance (F02.81)   Palliative care encounter (Z51.5)   ?   PPS: 40%   ?   HOSPICE ELIGIBILITY/DIAGNOSIS: TBD   ?   I met face to face with patient and family. Palliative Care was asked to follow this patient by consultation request of Dr. Kenton Kingfisher to address advance care planning and goals of care and caregiver support. This is?a follow-up?visit.    ?   ASSESSMENT AND PLAN / RECOMMENDATIONS:    Advance Care Planning/Goals of Care: Goals include to maximize quality of life and symptom management. Our advance care planning conversation included a discussion about: ??   "???????????The value and importance of advance care planning    "???????????Experiences with loved ones who have been seriously ill or have died    "???????????Exploration of personal, cultural or spiritual beliefs that might influence medical decisions    "???????????Exploration of goals of care in the event of a sudden injury or illness    "???????????Identification and preparation of a healthcare agent -[name] is her 67, daughter Warren Lacy is secondary. ?He believes she may have also done a living will. ?   "???????????Review and updating or creation of advance directive documents.   "???????????Decision not to resuscitate or to de-escalate disease focused treatments due to poor prognosis.   ?   "???????????CODE STATUS: Reviewed code status information with husband. Discussed goals of care for patient to include keeping patient safe at home  with best quality of life. Husband feels that despite patient's severe cognitive decline, she is still physically healthy otherwise and he believes that she still has a very good quality of life. Provided full explanation of MOST choices and explored the risks/benefits of each option. Confirmed MOST choices as follows: CPR, Limited scope of treatment, ABX if indicated, IVF for a defined trial period, and feeding tube for a defined period of time.      ?   Symptom Management/Plan:   Alzheimer's disease: Patient continues to have cognitive decline, unable to recall her name today and used less than six words during interaction. Husband reports she is more lucid in AM and gets more confused as the day goes on. She is pleasant, cooperative, but still becomes anxious and agitated with personal care and especially showers. Husband keeps her active with walks in the park or going to watch grandchildren's ballgames. Husband has some ongoing stress and caregiver strain. He is participating in a [employer] group for caregivers and has someone come for a few hours on Thursday mornings. He would still need additional help so he could have more time for self-care. Plan: Continue Namenda 39m BID and Rivastigmine 158mdaily. [facility] for any other caregiver resources and/or counseling as needed.   Palliative care encounter: ?will continue to follow and update ACP documents as needed if/when patient status changes.   ? Thank you for the opportunity to participate in the care of [name]?The palliative care team will continue to follow  for complex medical decision making, advance care planning, and clarification of goals. ?   ?   Return 4 weeks or prn. Please call our office at [phone] if we can be of additional assistance.    ?   This visit was coded based on medical decision making (MDM).  23 minutes spent on acp.    ?    COVID-19 PATIENT SCREENING TOOL   Asked and negative response unless otherwise noted:   ?   Have you had symptoms of covid, tested positive or been in contact with someone with symptoms/positive test in the past 5-10 days? no   ?   Chief Complaint: initial palliative care consult   ?   HISTORY OF PRESENT ILLNESS: ?This is a 76 yo female with a past medical history significant for Alzheimer's, depression, anemia, R knee OA, anxiety, insomnia seen for palliative care follow up visit in her home. Lives with husband of 43 years, [name], who is her primary caregiver and provides most of the information for HPI. [name] reports that patient is sleeping well most nights with occasional waking to talk but falls back to sleep easily. Sleeping 10pm-11:30 am. She also naps in recliner some during the day, but [name] tries to keep her awake and occupied during the day for her sleep-wake cycles. Patient continues to have cognitive decline, unable to recall her name today and used less than six words during visit. Husband reports she is more lucid in AM and gets more confused as the day goes on. She is pleasant, cooperative, but still becomes anxious and agitated with personal care and especially showers. Sundowning symptoms remain a challenge. [name] reports that patient has a good appetite and eats 100% of three meals/day, no dysphagia. She has regular BM's and wears depends for incontinent bowel and bladder. Patient remains independent with ambulation, but [name] still walks with her and holds her hand, no fall reported. Still has locks on outside doors to prevent wandering. Jim assist with all ADL's except feeding.  [name] keeps her active and states patient enjoys folding towels, sitting outside, going for with walks in the park, going to church on occasion, and going to watch grandchildren's ballgames.     They do go to the [place]. ?First and third Tuesday at [facility] has discussions,  games, entertainment, exercise for her. [name] states this has been a wonderful caregiver support for him. He quit working in 2012 to take care of Chante. ?She had been very sharp, energetic and bubbly. 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 ?[name] worked as a Scientist, forensic. 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 [name] is still having high stress levels and unable to do any of the self-care things he used to do for himself, like playing guitar or going to the gym. He still has difficulty dealing with the loss of a grandson in a MVA a couple years ago. He needs additional help with patient care. He still has a friend come for a few hours on Thursday mornings but is still waiting for more resources.     History obtained from review of EMR, discussion with primary team, and interview with family, facility staff/caregiver and/or patient.    I reviewed available labs, medications, imaging, studies and related documents from the EMR. ?Records reviewed and summarized above.    ?   ROS   General: NAD   EYES: denies vision changes  ENMT: denies dysphagia   Cardiovascular: denies chest pain, denies DOE   Pulmonary: denies cough, denies increased SOB   Abdomen: endorses good appetite, denies constipation, endorses incontinence of bowel   GU: denies dysuria, endorses incontinence of urine   MSK: ?denies weakness, no falls reported   Skin: denies rashes or wounds   Neurological: denies pain, has insomnia at times   Psych: Endorses positive mood   Heme/lymph/immuno: denies bruises, abnormal bleeding   ?   Physical Exam:   Current and past weights: ?117 lbs in Dec 2022, BMI 23 (stable from prior visit per PCP notes)   Constitutional: NAD   General: frail appearing, thin   EYES: anicteric sclera, lids intact, no discharge    ENMT: intact hearing, oral mucous membranes moist, dentition intact   CV: S1S2, RRR, no LE edema   Pulmonary:  LCTA, no increased work of breathing, no cough, room air   Abdomen: intake 100% of 3 meals/day, normo-active BS + 4 quadrants, soft and non-tender, no ascites   GU: deferred   MSK: no sarcopenia, moves all extremities, ambulatory but unsteady   Skin: warm and dry, no rashes or wounds on visible skin   Neuro: ?no generalized weakness,?advanced cognitive impairment, significant loss of vocabulary   Psych: non-anxious affect, A and O to familiar faces only   Hem/lymph/immuno: no widespread bruising   ?   PAST MEDICAL HISTORY: ?anxiety, OA bilateral knees and shoulders, AD, allergies, rosacea, osteopenia, microscopic hematuria, encephalitis in 1973, hyperlipidemia, tinnitus with decreased hearing   SOCIAL HX: ?never smoked, does not drink alcohol nor use illicit drugs, 2 children, 4 grandchildren   ALLERGIES: sulfa, melatonin, bupropion   CURRENT MEDICATIONS:    Namenda 37m po bid   Rivastigmine 129m   Zinc   D3   C   [place] biloba   B12   ?   [email or web address]   ?   See Assessment/Plan in the beginning of the note.   ?   Annelie Boak L. ReVernon PreyCMD   Palliative Care   Phone: ?[phone] [email or web address]

## 2021-09-27 ENCOUNTER — Telehealth: Payer: Self-pay

## 2021-09-27 NOTE — Telephone Encounter (Signed)
(  3:58 pm) PC SW left a message for patient's husband-Jim requesting a call back for support. ?

## 2021-11-05 ENCOUNTER — Telehealth: Payer: Self-pay | Admitting: Neurology

## 2021-11-05 MED ORDER — RIVASTIGMINE 13.3 MG/24HR TD PT24: MEDICATED_PATCH | TRANSDERMAL | 3 refills | Status: DC

## 2021-11-05 NOTE — Telephone Encounter (Signed)
Pt husband [name], on [place]) requesting refill for rivastigmine (EXELON) 13.3 MG/24HR at  [facility]. Pt has scheduled appt 12/06/2021.  ?Pt husband would like a call back concerning rivastigmine (EXELON) 13.3 MG/24HR.and pricing of medication.  ?[phone] ?

## 2021-11-05 NOTE — Telephone Encounter (Signed)
Rx sent, they will have to call pharmacy for pricing.  ?

## 2021-11-07 NOTE — Telephone Encounter (Signed)
Attempted to call pt, LVM for call back  °

## 2021-11-07 NOTE — Telephone Encounter (Signed)
Pt's husband said medication is now $200 for 90 day supply instead of $30. Would like a call from the nurse to discuss a generic brand or something to help with the cost. ?

## 2021-11-08 ENCOUNTER — Encounter: Payer: Self-pay | Admitting: Neurology

## 2021-11-08 MED ORDER — DONEPEZIL HCL 10 MG PO TABS: 10.0000 mg | ORAL_TABLET | Freq: Every day | ORAL | 3 refills | Status: DC

## 2021-11-08 NOTE — Addendum Note (Signed)
Addended by: Marcial Pacas on: 11/08/2021 04:50 PM ? ? Modules accepted: Orders ? ?

## 2021-11-08 NOTE — Telephone Encounter (Signed)
I spoke with the patient's husband (on [place]). Informed him that the prescription was sent to the pharmacy. He verbalized appreciation for the call. ?

## 2021-11-08 NOTE — Telephone Encounter (Signed)
Meds ordered this encounter  ?Medications  ? DISCONTD: rivastigmine (EXELON) 13.3 MG/24HR  ?  Sig: APPLY 1 PATCH TOPICALLY ONCE DAILY  ?  Dispense:  90 patch  ?  Refill:  3  ? donepezil (ARICEPT) 10 MG tablet  ?  Sig: Take 1 tablet (10 mg total) by mouth at bedtime.  ?  Dispense:  90 tablet  ?  Refill:  3  ?   ?

## 2021-11-08 NOTE — Telephone Encounter (Signed)
I spoke to the patient. His insurance has increased the price of rivastigmine patches. They are now responsible for a $200.00 co-pay for a 90-day supply. [facility] No PA needed. No deductible to be met. Just a higher cost this year. [facility] is even more expensive at $315 (90-days).  ? ?He is asking if there is a less expensive option for her to try. ?

## 2021-11-08 NOTE — Telephone Encounter (Signed)
I spoke to the patient's husband. He would like to try changing to donepezil. ?

## 2021-11-08 NOTE — Telephone Encounter (Signed)
Please let patient's know, Aricept have the same mechanism maximum as Exelon patch, there are both acetylcholine esterase inhibitor, ? ?But Aricept 10 mg daily has a bit more GI side effect, if she is willing, we can change her to Aricept ?

## 2021-11-08 NOTE — Progress Notes (Signed)
Please let patient's know, Aricept has same mechanism of action as Exelon patch, acetylcholinesterase inhibitor, but Aricept has a bit most GI side effect, 10 mg daily ? ? ?

## 2021-11-27 ENCOUNTER — Other Ambulatory Visit: Payer: PPO | Admitting: Internal Medicine

## 2021-12-06 ENCOUNTER — Ambulatory Visit: Payer: PPO | Admitting: Neurology

## 2021-12-06 ENCOUNTER — Encounter: Payer: Self-pay | Admitting: Neurology

## 2021-12-06 ENCOUNTER — Telehealth: Payer: Self-pay | Admitting: *Deleted

## 2021-12-06 VITALS — BP 112/65 | HR 57 | Ht 60.0 in | Wt 114.5 lb

## 2021-12-06 DIAGNOSIS — F03C11 Unspecified dementia, severe, with agitation: Secondary | ICD-10-CM | POA: Diagnosis not present

## 2021-12-06 DIAGNOSIS — F0280 Dementia in other diseases classified elsewhere without behavioral disturbance: Secondary | ICD-10-CM | POA: Insufficient documentation

## 2021-12-06 MED ORDER — MEMANTINE HCL 10 MG PO TABS: 10.0000 mg | ORAL_TABLET | Freq: Two times a day (BID) | ORAL | 4 refills | Status: DC

## 2021-12-06 MED ORDER — RIVASTIGMINE 13.3 MG/24HR TD PT24: 13.3000 mg | MEDICATED_PATCH | Freq: Every day | TRANSDERMAL | 4 refills | Status: DC

## 2021-12-06 MED ORDER — ARIPIPRAZOLE 2 MG PO TABS: 4.0000 mg | ORAL_TABLET | Freq: Every day | ORAL | 11 refills | Status: DC

## 2021-12-06 NOTE — Telephone Encounter (Signed)
PA for aripiprazole '2mg'$ , #60 for 30 started on covermymeds (key: [id number]). Pharmacy coverage through [employer] [phone]). Decision pending.

## 2021-12-06 NOTE — Progress Notes (Signed)
ASSESSMENT AND PLAN 76 y.o. year old female   Dementia with agitation  Very dedicated husband taking good care of her,  Continue Exelon patch 13.3 daily, prescription was written, also Namenda 10 mg twice a day  Sundowning phenomenon, occasionally agitations, refusing shower on a regular basis  Add on abilify 2 mg daily, may titrating to 4 mg daily, Seroquel as needed for difficulty sleeping,   Previously tried Ativan without significant benefit, some patient especially elderly dementia patient can have paradoxical reaction with benzodiazepine,  Return to clinic in 1 year with nurse practitioner  DIAGNOSTIC DATA (LABS, IMAGING, TESTING) - I reviewed patient records, labs, notes, testing and imaging myself where available.   HISTORY OF PRESENT ILLNESS: [name] is a 76 years old female, seen in refer by primary care physician Dr. Shirline Frees, for evaluation of memory loss, initial evaluation was on July 24, 2017.   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   She had significant decline since summer 2018, more noticeable word finding difficulties, no longer cooking, her husband has taken financial management at home, she also quit driving a few months ago,   There was no family history of dementia   She has become much less active, tends to watch TV all day long   Laboratory evaluations June 2018, hemoglobin of 14.2, creatinine 0.6, normal TSH 2.39, B12 414   MRI of the brain in July 2018::Generalized atrophy,  supratentorium small vessel disease   UPDATE October 22 2017:  Husband reported mild improvement with Namenda 10 mg twice a day, but she could not tolerate Aricept due to GI side effect, diarrhea, she also complains of recent onset of headaches, his right lower abdominal pain, has GI appointment pending,   Virtual visit on October 27, 2018, I was able to interview patient, with her husband present.  She is overall doing well with current medications Namenda 10 mg twice a day, Exelon patch 9.5 mg daily, she sleeps well, has good appetite   UPDATE May 04 2019: She is with her husband at today's clinical visit, Mini-Mental Status Examination is 15 out of 30, she sleeps well, eats well,  UPDATE Jun 19 2020: She is accompanied by her husband at today's clinical visit, complains of agitation at evening time, using Ativan as needed couple times each months.  She sleeps well most of the time, has good appetite, no significant gait abnormality.  UPDATE Dec 06 2021: She is accompanied by her husband at today's visit, dementia continues to progress, but overall she sleeps well, has good appetite, very hard for her to take a shower, occasionally evening time agitation, previously tried Ativan without significant benefit, Seroquel 25 mg every night works well for her, has only given her occasionally  Husband is tearful, he himself is taking citalopram, he has 5 hours each week now, so he can have some time by himself, patient cannot be left alone at home,  PHYSICAL EXAM  Vitals:   12/06/21 1049  BP: 112/65  Pulse: (!) 57  Weight: 114 lb 8 oz (51.9 kg)  Height: 5' (1.524 m)   Body mass index is 22.36 kg/m.  Generalized: Well developed, in no acute distress   PHYSICAL EXAMNIATION:  Gen: NAD, conversant, well nourised, well groomed                     Cardiovascular: Regular rate rhythm, no peripheral edema, warm, nontender. Eyes: Conjunctivae clear without exudates or hemorrhage Neck: Supple, no  carotid bruits. Pulmonary: Clear to auscultation bilaterally   NEUROLOGICAL EXAM:  MENTAL STATUS: Speech/cognition: Awake, difficulty following command, no complete sentences, reliant on her husband for history  CRANIAL NERVES: CN II: Visual fields are full to confrontation.  Pupils are round equal and briskly reactive to light. CN III, IV, VI: extraocular movement are normal. No ptosis. CN V: Facial sensation is intact  CN VII: Face is symmetric with normal eye closure and smile. CN VIII: Hearing is normal to casual conversation CN IX, X: Palate elevates symmetrically. Phonation is normal. CN XI: Head turning and shoulder shrug are intact   MOTOR: Moves 4 extremities without difficulty  COORDINATION: No truncal ataxia or dysmetria  GAIT/STANCE: Need push-up to get up from seated position, steady  REVIEW OF SYSTEMS: Out of a complete 14 system review of symptoms, the patient complains only of the following symptoms, and all other reviewed systems are negative.  Memory loss  ALLERGIES: Allergies  Allergen Reactions   Bupropion Other (See Comments)    Body aches    Donepezil Diarrhea   Melatonin Other (See Comments)    Shakes, nervous   Sulfa Antibiotics Hives    HOME MEDICATIONS: Outpatient Medications Prior to Visit  Medication Sig Dispense Refill   Ascorbic Acid (VITAMIN C PO) Take by mouth daily.     B Complex-C (SUPER B COMPLEX PO) Take by mouth.     cholecalciferol (VITAMIN D) 1000 UNITS tablet Take 1,000 Units by mouth daily.     Cyanocobalamin (VITAMIN B-12 PO) Take by mouth daily.     Ginkgo Biloba (GINKOBA PO) Take by mouth daily.     memantine (NAMENDA) 10 MG tablet Take 1 tablet (10 mg total) by mouth 2 (two) times daily. 180 tablet 4   multivitamin (THERAGRAN) per tablet Take 1 tablet by mouth daily.     QUEtiapine (SEROQUEL) 25 MG tablet Take 1 tablet (25 mg total) by mouth at bedtime. 90 tablet 1   rivastigmine (EXELON) 13.3 MG/24HR Place 1 patch onto  the skin at bedtime.     TURMERIC PO Take 1 capsule by mouth daily.     vitamin E 100 UNIT capsule Take 100 Units by mouth daily.     Zinc 50 MG TABS Take 1 tablet by mouth daily.     donepezil (ARICEPT) 10 MG tablet Take 1 tablet (10 mg total) by mouth at bedtime. (Patient not taking: Reported on 12/06/2021) 90 tablet 3   LORazepam (ATIVAN) 0.5 MG tablet Take 1 tablet (0.5 mg total) by mouth as needed. (Patient not taking: Reported on 12/06/2021) 30 tablet 3   Cyanocobalamin (VITAMIN B12) 1000 MCG TBCR Take 1 tablet by mouth daily.     No facility-administered medications prior to visit.    PAST MEDICAL HISTORY: Past Medical History:  Diagnosis Date   Anxiety    Arthritis    lower back & knees   Back pain    Cataracts, bilateral    Depression    takes Citalopram daily   Encephalitis 1973  Headache(784.0)    occasionally from a fall a month ago   Hearing loss    History of blood transfusion    no abnormal reaction noted   Hypertension    Insomnia    Joint pain    Joint swelling    Memory loss    Nocturia    Osteoarthritis    Rosacea     PAST SURGICAL HISTORY: Past Surgical History:  Procedure Laterality Date   BACK SURGERY  2007   lumbar fusion w/ rods & screws- lower back   COLONOSCOPY     CYST EXCISION  2005   lumbar cyst   JOINT REPLACEMENT  2009   left knee replacement   MASS EXCISION  twice: 08/28/2011; 2012   Procedure: EXCISION MASS;  Surgeon: Wynonia Sours, MD;  Location: [facility];  Service: Orthopedics;  Laterality: Left;  excision mass left ring finger   MASS EXCISION  02/11/2012   Procedure: EXCISION MASS;  Surgeon: Wynonia Sours, MD;  Location: [facility];  Service: Orthopedics;  Laterality: Left;  EXCISION MUCOID CYST, DEBRIDEMENT INTERPHALANGEAL JOINT LEFT THUMB   mass removed from left ring finger  2013   RETINAL DETACHMENT SURGERY Right    SHOULDER ARTHROSCOPY WITH ROTATOR CUFF REPAIR AND SUBACROMIAL DECOMPRESSION Left  07/26/2013   Procedure: LEFT SHOULDER ARTHROSCOPY WITH /SUBACROMIAL DECOMPRESSION AND DISTAL CLAVICLE RESECTION;  Surgeon: Yvette Rack., MD;  Location: [facility];  Service: Orthopedics;  Laterality: Left;   TOTAL KNEE ARTHROPLASTY Right 01/07/2014   Procedure: RIGHT TOTAL KNEE ARTHROPLASTY MEDIAL/LATERAL COMPARTMENTS WITH PATELLA RESURFACING;  Surgeon: Yvette Rack., MD;  Location: [facility];  Service: Orthopedics;  Laterality: Right;    FAMILY HISTORY: Family History  Problem Relation Age of Onset   Pancreatic cancer Mother    Alcohol abuse Father     SOCIAL HISTORY: Social History   Socioeconomic History   Marital status: Married    Spouse name: Not on file   Number of children: 2   Years of education: College   Highest education level: Not on file  Occupational History   Occupation: Retired  Tobacco Use   Smoking status: Never   Smokeless tobacco: Never  Scientific laboratory technician Use: Never used  Substance and Sexual Activity   Alcohol use: No    Alcohol/week: 0.0 standard drinks   Drug use: No   Sexual activity: Yes    Partners: Male    Birth control/protection: Post-menopausal  Other Topics Concern   Not on file  Social History Narrative   Lives at home with her husband.   Left-handed.   No caffeine per day.   Social Determinants of Health   Financial Resource Strain: Not on file  Food Insecurity: Not on file  Transportation Needs: Not on file  Physical Activity: Not on file  Stress: Not on file  Social Connections: Not on file  Intimate Partner Violence: Not on file    Marcial Pacas, M.D. Ph.D.  [facility] [place], [place] [address] Phone: [phone] Fax:      [phone]

## 2021-12-06 NOTE — Telephone Encounter (Signed)
PA approved through 07/14/2022. Request [id number].

## 2021-12-07 DIAGNOSIS — E7800 Pure hypercholesterolemia, unspecified: Secondary | ICD-10-CM | POA: Diagnosis not present

## 2021-12-07 DIAGNOSIS — G300 Alzheimer's disease with early onset: Secondary | ICD-10-CM | POA: Diagnosis not present

## 2021-12-27 ENCOUNTER — Encounter: Payer: Self-pay | Admitting: Internal Medicine

## 2021-12-27 ENCOUNTER — Other Ambulatory Visit: Payer: PPO | Admitting: Internal Medicine

## 2021-12-27 VITALS — BP 142/64 | HR 57 | Temp 97.5°F | Resp 16

## 2021-12-27 DIAGNOSIS — R2681 Unsteadiness on feet: Secondary | ICD-10-CM | POA: Diagnosis not present

## 2021-12-27 DIAGNOSIS — G301 Alzheimer's disease with late onset: Secondary | ICD-10-CM | POA: Diagnosis not present

## 2021-12-27 DIAGNOSIS — G4720 Circadian rhythm sleep disorder, unspecified type: Secondary | ICD-10-CM | POA: Diagnosis not present

## 2021-12-27 DIAGNOSIS — F02818 Dementia in other diseases classified elsewhere, unspecified severity, with other behavioral disturbance: Secondary | ICD-10-CM

## 2021-12-27 DIAGNOSIS — F518 Other sleep disorders not due to a substance or known physiological condition: Secondary | ICD-10-CM | POA: Diagnosis not present

## 2021-12-27 DIAGNOSIS — Z515 Encounter for palliative care: Secondary | ICD-10-CM

## 2021-12-27 NOTE — Progress Notes (Signed)
[facility] Follow-Up Visit Telephone: [phone]  Fax: [phone]   Date of encounter: 12/27/21 10:12 AM PATIENT NAME: [name] [address] [place] [address]   [phone] (home)  DOB: February 08, 1946 MRN: [id number] PRIMARY CARE PROVIDER:    Shirline Frees, MD,  [address] [place] [address] [phone]  REFERRING PROVIDER:   Shirline Frees, MD [address],  [place] [address] [phone]  RESPONSIBLE PARTY:    Contact Information     Name Relation Home Work Mobile   [name] Spouse [phone]  [phone]   [name] Daughter   [phone]        I met face to face with patient and family in her home/facility. Palliative Care was asked to follow this patient by consultation request of  Diana Frees, MD to address advance care planning and complex medical decision making. This is follow-up visit.                                     ASSESSMENT AND PLAN / RECOMMENDATIONS:   Advance Care Planning/Goals of Care: Goals include to maximize quality of life and symptom management. Patient/health care surrogate gave his/her permission to discuss.Our advance care planning conversation included a discussion about:    The value and importance of advance care planning  Experiences with loved ones who have been seriously ill or have died  Exploration of personal, cultural or spiritual beliefs that might influence medical decisions  Exploration of goals of care in the event of a sudden injury or illness  Identification  of a healthcare agent--her husband, [name] Review and updating or creation of an  advance directive document . Decision not to resuscitate or to de-escalate disease focused treatments due to poor prognosis. CODE STATUS:  FULL CODE; limited additional interventions (no ventilator), abx if indicated, IVF trial, feeding tube trial as of 09/25/21  Symptom  Management/Plan: 1. Late onset Alzheimer's dementia with behavioral disturbance ([place]) -progressing gradually as expected -[name] continues to struggle with her for changing of clothes and bathing at times --in-home caregiving not affordable for them -continue support group associated with [employer] cafe where they go -she continues namenda and rivastigmine   2. Disrupted sleep-wake cycle -goes to bed late and gets up very late now, continue to try to maintain a similar schedule day to day which will be helpful for her  3. Unsteady gait -remains, but not using assistive device, husband supports her as she cannot remember to use devices  4. Palliative care by specialist -continue to follow them over time, continue friend helping out, support group and [name]'s dedicated care to his wife -she remains ambulatory, talks less, but does remain verbal, has more incontinence of bowel and bladder now, sleeps more vs before, but PPS remains strong 40%    Follow up Palliative Care Visit: Palliative care will continue to follow for complex medical decision making, advance care planning, and clarification of goals. Return 12 weeks or prn.   This visit was coded based on medical decision making (MDM).  PPS: 40%  HOSPICE ELIGIBILITY/DIAGNOSIS: TBD  Chief Complaint: Follow-up palliative visit  HISTORY OF PRESENT ILLNESS:  [name] is a 76 y.o. year old female  with Alzheimer's disease cared for by her husband at home.   Has to cue her over and over moreso.   More incontinence. Continues to talk  if conversation encouraged.   She tries to read.  Hard to get her to color also.  Likes some TV and sports.  She has started to like [place] that [name] also enjoys and country.  She'll tap her foot and clap her hands some.  She will sit on the swing and listen to music while he mows the yard. She no longer naps in the daytime. She takes 32m melatonin at hs, but sleep cycles are still off.   May need her quetiapine when restless and walking around at bedtime--does rest with that.  Mr. RSorensonhas allowed her to sleep in a little today. One minute to the next with memory Tries to pick up trash out and about Namenda and exelon patch plus same vitamins.   She gets more stressed and anxious than she had Goes twice a week [facility] [employer] She seems to enjoy that. [facility] resources has provided 50 hrs to them for a month.  He continues to try to get more help.   Dr. YKrista Bluesuggested abilify instead of lorazepam but it was going to be $1300.  Obviously that was not affordable.  Lorazepam actually agitates her.   Sometimes wanders around and picks up items, roaming around a bit agitated. He is very fearful she will walk off when they go out anywhere.   She was trying to head for the backdoor and front door during the bday party they went to  He has specialty locks in the home to prevent this She has some hesitation with meals sometimes, but still mostly cleaning her plate Sleep varies--going to bed at different times often.  She's very clingy and may not want to be w/o him.  They are also still attending first and 3rd Tuesdays [name]  Support group and separate participant activity.  It is a growing program.    [name] her hand wherever they go to prevent falls.  She is a quite unsteady now.  She does not enjoy exercise.  They will take walks around the yard and in the home.     Diana Rinksshares that some increase in his antidepressant has been helpful for him.  Says he's more patient than he was.  She still hates changing clothes and showering.  She calms down when he says they'll do it together and take their time.  Their daughter is an occupational therapist so she sometimes offers good advice.  Diana Rinkstries to reminisce about how intelligent and what a beautiful person SMonicehad been pre-AD--it's hard for him to think about how she has the  mind of a 5-6 yo child now.    History obtained from review of EMR, discussion with primary team, and interview with family, facility staff/caregiver and/or [name].  I reviewed available labs, medications, imaging, studies and related documents from the EMR.  Records reviewed and summarized above.   ROS:  see hpi for details Review of Systems  Physical Exam: There were no vitals filed for this visit. There is no height or weight on file to calculate BMI. Wt Readings from Last 500 Encounters:  12/06/21 114 lb 8 oz (51.9 kg)  12/18/20 119 lb (54 kg)  06/19/20 125 lb 8 oz (56.9 kg)  11/02/19 123 lb (55.8 kg)  05/04/19 124 lb (56.2 kg)  04/23/18 115 lb 9.6 oz (52.4 kg)  10/22/17 108 lb 8 oz (49.2 kg)  07/24/17 107 lb 12 oz (48.9 kg)  06/15/14 108  lb 9.6 oz (49.3 kg)  03/08/14 108 lb 3.2 oz (49.1 kg)  01/07/14 112 lb 12.8 oz (51.2 kg)  12/28/13 112 lb 12.8 oz (51.2 kg)  07/26/13 108 lb 6.4 oz (49.2 kg)  02/07/12 120 lb (54.4 kg)  08/23/11 118 lb (53.5 kg)   Physical Exam Constitutional:      Appearance: Normal appearance.  HENT:     Head: Normocephalic and atraumatic.  Eyes:     Comments: glasses  Cardiovascular:     Rate and Rhythm: Normal rate and regular rhythm.     Pulses: Normal pulses.     Heart sounds: Normal heart sounds.  Pulmonary:     Effort: Pulmonary effort is normal.     Breath sounds: Normal breath sounds. No wheezing, rhonchi or rales.  Abdominal:     General: Bowel sounds are normal.     Palpations: Abdomen is soft.     Tenderness: There is no abdominal tenderness.  Musculoskeletal:        General: Normal range of motion.     Right lower leg: No edema.     Left lower leg: No edema.  Neurological:     Mental Status: She is alert.     Gait: Gait abnormal.     Comments: Oriented to husband, but not place or time, goes back to sleep as soon as I stop speaking to her and examining her     CURRENT PROBLEM LIST:  Patient Active Problem List    Diagnosis Date Noted   Severe dementia with agitation ([place]) 12/06/2021   Anxiety disorder 09/25/2021   Pure hypercholesterolemia 09/25/2021   Late onset Alzheimer's dementia with behavioral disturbance ([place]) 06/19/2020   Dementia without behavioral disturbance ([place]) 05/04/2019   Mixed conductive and sensorineural hearing loss, bilateral 11/17/2017   Dementia ([place]) 07/24/2017   Acute blood loss anemia 01/08/2014   Depression    Osteoarthritis of right knee 01/07/2014    PAST MEDICAL HISTORY:  Active Ambulatory Problems    Diagnosis Date Noted   Osteoarthritis of right knee 01/07/2014   Acute blood loss anemia 01/08/2014   Depression    Dementia ([place]) 07/24/2017   Dementia without behavioral disturbance ([place]) 05/04/2019   Late onset Alzheimer's dementia with behavioral disturbance ([place]) 06/19/2020   Anxiety disorder 09/25/2021   Mixed conductive and sensorineural hearing loss, bilateral 11/17/2017   Pure hypercholesterolemia 09/25/2021   Severe dementia with agitation ([place]) 12/06/2021   Resolved Ambulatory Problems    Diagnosis Date Noted   No Resolved Ambulatory Problems   Past Medical History:  Diagnosis Date   Anxiety    Arthritis    Back pain    Cataracts, bilateral    Encephalitis 1973   Headache(784.0)    Hearing loss    History of blood transfusion    Hypertension    Insomnia    Joint pain    Joint swelling    Memory loss    Nocturia    Osteoarthritis    Rosacea     SOCIAL HX:  Social History   Tobacco Use   Smoking status: Never   Smokeless tobacco: Never  Substance Use Topics   Alcohol use: No    Alcohol/week: 0.0 standard drinks of alcohol     ALLERGIES:  Allergies  Allergen Reactions   Bupropion Other (See Comments)    Body aches    Donepezil Diarrhea   Melatonin Other (See Comments)    Shakes, nervous   Sulfa Antibiotics Hives  PERTINENT MEDICATIONS:  Outpatient Encounter Medications as of 12/27/2021  Medication Sig    ARIPiprazole (ABILIFY) 2 MG tablet Take 2 tablets (4 mg total) by mouth daily.   Ascorbic Acid (VITAMIN C PO) Take by mouth daily.   B Complex-C (SUPER B COMPLEX PO) Take by mouth.   cholecalciferol (VITAMIN D) 1000 UNITS tablet Take 1,000 Units by mouth daily.   Cyanocobalamin (VITAMIN B-12 PO) Take by mouth daily.   Ginkgo Biloba (GINKOBA PO) Take by mouth daily.   memantine (NAMENDA) 10 MG tablet Take 1 tablet (10 mg total) by mouth 2 (two) times daily.   multivitamin (THERAGRAN) per tablet Take 1 tablet by mouth daily.   QUEtiapine (SEROQUEL) 25 MG tablet Take 1 tablet (25 mg total) by mouth at bedtime.   rivastigmine (EXELON) 13.3 MG/24HR Place 1 patch (13.3 mg total) onto the skin daily.   TURMERIC PO Take 1 capsule by mouth daily.   vitamin E 100 UNIT capsule Take 100 Units by mouth daily.   Zinc 50 MG TABS Take 1 tablet by mouth daily.   No facility-administered encounter medications on file as of 12/27/2021.    Thank you for the opportunity to participate in the care of Ms. Hilgers.  The palliative care team will continue to follow. Please call our office at [phone] if we can be of additional assistance.   Hollace Kinnier, DO  COVID-19 PATIENT SCREENING TOOL Asked and negative response unless otherwise noted:  Have you had symptoms of covid, tested positive or been in contact with someone with symptoms/positive test in the past 5-10 days? no

## 2022-02-18 ENCOUNTER — Telehealth: Payer: Self-pay | Admitting: Neurology

## 2022-02-18 ENCOUNTER — Other Ambulatory Visit: Payer: Self-pay | Admitting: Neurology

## 2022-02-18 MED ORDER — RIVASTIGMINE 13.3 MG/24HR TD PT24: 13.3000 mg | MEDICATED_PATCH | Freq: Every day | TRANSDERMAL | 2 refills | Status: DC

## 2022-02-18 NOTE — Telephone Encounter (Signed)
Pt husband [name] ) is calling and requesting a prescription for rivastigmine (EXELON) 13.3 MG/24HR be sent to [facility] [phone]

## 2022-02-18 NOTE — Addendum Note (Signed)
Addended by: Cristela Felt E on: 02/18/2022 04:20 PM   Modules accepted: Orders

## 2022-02-18 NOTE — Telephone Encounter (Signed)
Rx refilled as per last office visit note. 

## 2022-02-21 ENCOUNTER — Other Ambulatory Visit: Payer: PPO | Admitting: Internal Medicine

## 2022-02-21 DIAGNOSIS — G4720 Circadian rhythm sleep disorder, unspecified type: Secondary | ICD-10-CM | POA: Diagnosis not present

## 2022-02-21 DIAGNOSIS — F02818 Dementia in other diseases classified elsewhere, unspecified severity, with other behavioral disturbance: Secondary | ICD-10-CM

## 2022-02-21 DIAGNOSIS — G301 Alzheimer's disease with late onset: Secondary | ICD-10-CM

## 2022-02-21 DIAGNOSIS — F518 Other sleep disorders not due to a substance or known physiological condition: Secondary | ICD-10-CM

## 2022-02-21 DIAGNOSIS — R2681 Unsteadiness on feet: Secondary | ICD-10-CM

## 2022-02-21 DIAGNOSIS — Z515 Encounter for palliative care: Secondary | ICD-10-CM | POA: Diagnosis not present

## 2022-02-25 NOTE — Progress Notes (Signed)
[facility] Follow-Up Visit Telephone: [phone]  Fax: [phone]   Date of encounter: 02/25/22 10:26 AM PATIENT NAME: [name] [address] [place] [address]   [phone] (home)  DOB: 1946-05-24 MRN: [id number] PRIMARY CARE PROVIDER:    Shirline Frees, MD,  [address] [place] [address] [place] PROVIDER:   Shirline Frees, MD [place],  [place] [address] [phone]  RESPONSIBLE PARTY:    Contact Information     Name Relation Home Work Mobile   [name] Spouse [phone]  [phone]   [name] Daughter   [phone]        I met face to face with patient and family in her home. Palliative Care was asked to follow this patient by consultation request of  Diana Frees, MD to address advance care planning and complex medical decision making. This is follow-up visit.                                     ASSESSMENT AND PLAN / RECOMMENDATIONS:   Advance Care Planning/Goals of Care: Goals include to maximize quality of life and symptom management. Patient/health care surrogate gave his/her permission to discuss.Our advance care planning conversation included a discussion about:    The value and importance of advance care planning  Experiences with loved ones who have been seriously ill or have died  Exploration of personal, cultural or spiritual beliefs that might influence medical decisions  Exploration of goals of care in the event of a sudden injury or illness  Identification  of a healthcare agent  Review and updating or creation of an  advance directive document . Decision not to resuscitate or to de-escalate disease focused treatments due to poor prognosis. CODE STATUS:  FULL CODE, MOST on file  Symptom Management/Plan: 1. Late onset Alzheimer's dementia with behavioral disturbance ([place]) -continues to attend [facility]  program with husband, has 24x7 care by him, did have temporary help through [facility] for a few hours that has now ended -special locks on doors for safety -challenges remain with bathing, changing, but a bit better lately  2. Disrupted sleep-wake cycle -stays up late and sleeps in often, husband, [name], adjusts accordingly  3. Unsteady gait -continues w/o assistive device, holding hands with husband and also sister while she was visiting  4. Palliative care by specialist -continue 24x7 supervision, remains ambulatory, biggest next concerns are potential for more falls and declining ambulation, dysphagia -behaviors with ADLs a bit better now    Follow up Palliative Care Visit: Palliative care will continue to follow for complex medical decision making, advance care planning, and clarification of goals. Return 05/21/2022  weeks or prn.  This visit was coded based on medical decision making (MDM).  PPS: 40%  HOSPICE ELIGIBILITY/DIAGNOSIS: TBD  Chief Complaint: Follow-up palliative visit  HISTORY OF PRESENT ILLNESS:  [name] is a 76 y.o. year old female  with AD, OA of knees seen for palliative f/u . Sister and BIL in town visiting.  They got out quite a bit with [name]--she enjoys dancing and music.  They went to the memorial at [employer] and enjoyed it.    She struggles some with bathing and changing still.  Sleeps a lot probably a little more than last time.  Appetite remains excellent, eats well, no coughing or choking.  No falls.  Still ambulating albeit unsteadily but w/o assistive device and going to [employer].    History obtained from review of EMR, discussion with primary team, and interview with family, facility staff/caregiver and/or [name].  I reviewed available labs, medications, imaging, studies and related documents from the EMR.  Records reviewed and summarized above.   ROS Review of Systems  Constitutional:  Negative for  activity change, appetite change, fever and unexpected weight change.  HENT:  Negative for congestion.   Respiratory:  Negative for chest tightness and shortness of breath.   Cardiovascular:  Negative for chest pain, palpitations and leg swelling.  Gastrointestinal:  Negative for abdominal pain and constipation.       Some increase in bowel incontinence  Genitourinary:  Negative for dysuria.       More incontinence of bladder  Musculoskeletal:  Positive for arthralgias and gait problem.  Skin:  Negative for color change.  Neurological:  Negative for dizziness and weakness.  Psychiatric/Behavioral:  Positive for agitation and confusion. Negative for hallucinations and sleep disturbance. The patient is not nervous/anxious.     Physical Exam:  Wt Readings from Last 500 Encounters:  12/06/21 114 lb 8 oz (51.9 kg)  12/18/20 119 lb (54 kg)  06/19/20 125 lb 8 oz (56.9 kg)  11/02/19 123 lb (55.8 kg)  05/04/19 124 lb (56.2 kg)  04/23/18 115 lb 9.6 oz (52.4 kg)  10/22/17 108 lb 8 oz (49.2 kg)  07/24/17 107 lb 12 oz (48.9 kg)  06/15/14 108 lb 9.6 oz (49.3 kg)  03/08/14 108 lb 3.2 oz (49.1 kg)  01/07/14 112 lb 12.8 oz (51.2 kg)  12/28/13 112 lb 12.8 oz (51.2 kg)  07/26/13 108 lb 6.4 oz (49.2 kg)  02/07/12 120 lb (54.4 kg)  08/23/11 118 lb (53.5 kg)   Physical Exam Vitals reviewed.  Cardiovascular:     Rate and Rhythm: Normal rate and regular rhythm.     Pulses: Normal pulses.     Heart sounds: Normal heart sounds.  Pulmonary:     Effort: Pulmonary effort is normal.     Breath sounds: Normal breath sounds. No rhonchi.  Abdominal:     General: Bowel sounds are normal.     Palpations: Abdomen is soft.     Tenderness: There is no abdominal tenderness.  Musculoskeletal:        General: Normal range of motion.     Right lower leg: No edema.     Left lower leg: No edema.  Neurological:     Mental Status: She is alert.     Comments: Speaks, but word salads or aphasic words; yes/no  often incorrect; mostly responds to questions rarely initiating speech  Psychiatric:        Mood and Affect: Mood normal.     CURRENT PROBLEM LIST:  Patient Active Problem List   Diagnosis Date Noted   Disrupted sleep-wake cycle 12/27/2021   Unsteady gait 12/27/2021   Severe dementia with agitation ([place]) 12/06/2021   Anxiety disorder 09/25/2021   Pure hypercholesterolemia 09/25/2021   Late onset Alzheimer's dementia with behavioral disturbance ([place]) 06/19/2020   Dementia without behavioral disturbance ([place]) 05/04/2019   Mixed conductive and sensorineural hearing loss, bilateral 11/17/2017   Dementia ([place]) 07/24/2017   Acute blood loss anemia 01/08/2014   Depression    Osteoarthritis of right knee 01/07/2014    PAST MEDICAL HISTORY:  Active Ambulatory Problems    Diagnosis Date Noted   Osteoarthritis of right knee 01/07/2014  Acute blood loss anemia 01/08/2014   Depression    Dementia ([place]) 07/24/2017   Dementia without behavioral disturbance ([place]) 05/04/2019   Late onset Alzheimer's dementia with behavioral disturbance ([place]) 06/19/2020   Anxiety disorder 09/25/2021   Mixed conductive and sensorineural hearing loss, bilateral 11/17/2017   Pure hypercholesterolemia 09/25/2021   Severe dementia with agitation ([place]) 12/06/2021   Disrupted sleep-wake cycle 12/27/2021   Unsteady gait 12/27/2021   Resolved Ambulatory Problems    Diagnosis Date Noted   No Resolved Ambulatory Problems   Past Medical History:  Diagnosis Date   Anxiety    Arthritis    Back pain    Cataracts, bilateral    Encephalitis 1973   Headache(784.0)    Hearing loss    History of blood transfusion    Hypertension    Insomnia    Joint pain    Joint swelling    Memory loss    Nocturia    Osteoarthritis    Rosacea     SOCIAL HX:  Social History   Tobacco Use   Smoking status: Never   Smokeless tobacco: Never  Substance Use Topics   Alcohol use: No    Alcohol/week: 0.0 standard drinks of  alcohol     ALLERGIES:  Allergies  Allergen Reactions   Bupropion Other (See Comments)    Body aches    Donepezil Diarrhea   Melatonin Other (See Comments)    Shakes, nervous   Sulfa Antibiotics Hives      PERTINENT MEDICATIONS:  Outpatient Encounter Medications as of 02/21/2022  Medication Sig   ARIPiprazole (ABILIFY) 2 MG tablet Take 2 tablets (4 mg total) by mouth daily.   Ascorbic Acid (VITAMIN C PO) Take by mouth daily.   B Complex-C (SUPER B COMPLEX PO) Take by mouth.   cholecalciferol (VITAMIN D) 1000 UNITS tablet Take 1,000 Units by mouth daily.   Cyanocobalamin (VITAMIN B-12 PO) Take by mouth daily.   Ginkgo Biloba (GINKOBA PO) Take by mouth daily.   memantine (NAMENDA) 10 MG tablet Take 1 tablet (10 mg total) by mouth 2 (two) times daily.   multivitamin (THERAGRAN) per tablet Take 1 tablet by mouth daily.   QUEtiapine (SEROQUEL) 25 MG tablet Take 1 tablet (25 mg total) by mouth at bedtime.   rivastigmine (EXELON) 13.3 MG/24HR Place 1 patch (13.3 mg total) onto the skin daily.   TURMERIC PO Take 1 capsule by mouth daily.   vitamin E 100 UNIT capsule Take 100 Units by mouth daily.   Zinc 50 MG TABS Take 1 tablet by mouth daily.   No facility-administered encounter medications on file as of 02/21/2022.    Thank you for the opportunity to participate in the care of Ms. Nam.  The palliative care team will continue to follow. Please call our office at [phone] if we can be of additional assistance.   Hollace Kinnier, DO  COVID-19 PATIENT SCREENING TOOL Asked and negative response unless otherwise noted:  Have you had symptoms of covid, tested positive or been in contact with someone with symptoms/positive test in the past 5-10 days? No

## 2022-04-01 DIAGNOSIS — G300 Alzheimer's disease with early onset: Secondary | ICD-10-CM | POA: Diagnosis not present

## 2022-04-01 DIAGNOSIS — E7800 Pure hypercholesterolemia, unspecified: Secondary | ICD-10-CM | POA: Diagnosis not present

## 2022-04-01 DIAGNOSIS — Z23 Encounter for immunization: Secondary | ICD-10-CM | POA: Diagnosis not present

## 2022-04-01 DIAGNOSIS — Z0000 Encounter for general adult medical examination without abnormal findings: Secondary | ICD-10-CM | POA: Diagnosis not present

## 2022-05-16 ENCOUNTER — Ambulatory Visit: Payer: PPO | Admitting: Podiatry

## 2022-05-21 ENCOUNTER — Other Ambulatory Visit: Payer: PPO | Admitting: Internal Medicine

## 2022-05-21 VITALS — Temp 97.8°F | Resp 16

## 2022-05-21 DIAGNOSIS — R2681 Unsteadiness on feet: Secondary | ICD-10-CM

## 2022-05-21 DIAGNOSIS — Z515 Encounter for palliative care: Secondary | ICD-10-CM | POA: Diagnosis not present

## 2022-05-21 DIAGNOSIS — G301 Alzheimer's disease with late onset: Secondary | ICD-10-CM | POA: Diagnosis not present

## 2022-05-21 DIAGNOSIS — R159 Full incontinence of feces: Secondary | ICD-10-CM | POA: Diagnosis not present

## 2022-05-21 DIAGNOSIS — R32 Unspecified urinary incontinence: Secondary | ICD-10-CM | POA: Diagnosis not present

## 2022-05-21 DIAGNOSIS — F02818 Dementia in other diseases classified elsewhere, unspecified severity, with other behavioral disturbance: Secondary | ICD-10-CM

## 2022-05-21 NOTE — Progress Notes (Signed)
[facility] Follow-Up Visit Telephone: [phone]  Fax: [phone]   Date of encounter: 05/26/22 9:02 AM PATIENT NAME: [name] [id number]   [phone] (home)  DOB: 1946/04/25 MRN: [id number] PRIMARY CARE PROVIDER:    Shirline Frees, MD,  [address] [place] [address] [place] PROVIDER:   Shirline Frees, MD [place],  [place] [address] [phone]  RESPONSIBLE PARTY:    Contact Information     Name Relation Home Work Mobile   [name] Spouse [phone]  [phone]   [name]   [phone]        I met face to face with patient and family in her home with husband, [name]. Palliative Care was asked to follow this patient by consultation request of  Shirline Frees, MD to address advance care planning and complex medical decision making. This is follow-up visit.                                     ASSESSMENT AND PLAN / RECOMMENDATIONS:   Advance Care Planning/Goals of Care: Goals include to maximize quality of life and symptom management. Patient/health care surrogate gave his/her permission to discuss.Our advance care planning conversation included a discussion about:    The value and importance of advance care planning  Experiences with loved ones who have been seriously ill or have died  Exploration of personal, cultural or spiritual beliefs that might influence medical decisions  Exploration of goals of care in the event of a sudden injury or illness  Identification  of a healthcare agent  Review and updating or creation of an  advance directive document . Decision not to resuscitate or to de-escalate disease focused treatments due to poor prognosis. CODE STATUS:  full code, full scope of treatment--Jim desires this for [name] as he feels she still has good quality of life and they are able to get out  and about and do things [name] is now going to the [facility] with [place] three days per week and thriving there per Jim's report.  They continue to attend the program at the [facility], as well which is later today She is more aware and happy now.  They went to see leaves changing at [employer] and [place] as well.  They enjoy swinging on the swing in the backyard together.   [name] eats and sleeps well without weight loss--weight 120 lbs last check There continue to be some challenges with changing dirty depends and at home she won't go when she needs to or when [name] suggests it at home.  Is doing better with this at [facility] when they take her on a schedule with cues.  Symptom Management/Plan: 1. Late onset Alzheimer's dementia with behavioral disturbance ([place]) -continues very gradual progression -diong well going to [facility] program -continue with this -continues support from husband at home with 24x7 supervision -continue caregiver support with [employer]  2. Unsteady gait -does not use assistive device, going out to day program should help with mobility by keeping her more active and stronger  3. Urinary and fecal incontinence -continues to be a challenge though she does better at [facility] than at home with getting to restroom in time with cues  4. Palliative care by specialist -discussed changes to program will mean Zahriah will not  be followed by our team at home -explained to [facility] signs of progression that would warrant reconsulting for hospice care if desired, but she's not nearly eligible at this time unless she has a sudden illness  I spent 60 minutes providing this consultation. More than 50% of the time in this consultation was spent in counseling and care coordination.   PPS: 50%--needs helps with adls except to feed self but very much ambulatory, speech declining, but can still speak several words  HOSPICE ELIGIBILITY/DIAGNOSIS: TBD  Chief  Complaint: Follow-up palliative visit  HISTORY OF PRESENT ILLNESS:  [name] is a 76 y.o. year old female  with late onset AD with behavioral disturbance, hyperlipidemia, OA of knees, urinary and fecal incontinence seen in palliative f/u at home along with her husband [name].     See a/p for updates.  History obtained from review of EMR, discussion with primary team, and interview with family, facility staff/caregiver and/or [name].  I reviewed available labs, medications, imaging, studies and related documents from the EMR.  Records reviewed and summarized above.   ROS Review of Systemssee a/p   Physical Exam: Vitals:   05/26/22 0902  Resp: 16  Temp: 97.8 F (36.6 C)  SpO2: 99%   There is no height or weight on file to calculate BMI. Wt Readings from Last 500 Encounters:  12/06/21 114 lb 8 oz (51.9 kg)  12/18/20 119 lb (54 kg)  06/19/20 125 lb 8 oz (56.9 kg)  11/02/19 123 lb (55.8 kg)  05/04/19 124 lb (56.2 kg)  04/23/18 115 lb 9.6 oz (52.4 kg)  10/22/17 108 lb 8 oz (49.2 kg)  07/24/17 107 lb 12 oz (48.9 kg)  06/15/14 108 lb 9.6 oz (49.3 kg)  03/08/14 108 lb 3.2 oz (49.1 kg)  01/07/14 112 lb 12.8 oz (51.2 kg)  12/28/13 112 lb 12.8 oz (51.2 kg)  07/26/13 108 lb 6.4 oz (49.2 kg)  02/07/12 120 lb (54.4 kg)  08/23/11 118 lb (53.5 kg)   Physical Exam Constitutional:      Appearance: Normal appearance.  HENT:     Head: Normocephalic and atraumatic.  Eyes:     Comments: Wearing her glasses today  Cardiovascular:     Rate and Rhythm: Normal rate and regular rhythm.  Pulmonary:     Effort: Pulmonary effort is normal.     Breath sounds: Normal breath sounds.  Abdominal:     General: Bowel sounds are normal.  Musculoskeletal:        General: No tenderness. Normal range of motion.  Skin:    General: Skin is warm and dry.  Neurological:     General: No focal deficit present.     Mental Status: She is alert.     Comments: Ambulates slowly and unsteady when first  getting up  Psychiatric:     Comments: Responses not always congruent, answers yes or no, does not give long responses, keeps head down a lot of time     CURRENT PROBLEM LIST:  Patient Active Problem List   Diagnosis Date Noted   Disrupted sleep-wake cycle 12/27/2021   Unsteady gait 12/27/2021   Severe dementia with agitation ([place]) 12/06/2021   Anxiety disorder 09/25/2021   Pure hypercholesterolemia 09/25/2021   Late onset Alzheimer's dementia with behavioral disturbance ([place]) 06/19/2020   Dementia without behavioral disturbance ([place]) 05/04/2019   Mixed conductive and sensorineural hearing loss, bilateral 11/17/2017   Dementia ([place]) 07/24/2017   Acute blood loss anemia 01/08/2014   Depression  Osteoarthritis of right knee 01/07/2014    PAST MEDICAL HISTORY:  Active Ambulatory Problems    Diagnosis Date Noted   Osteoarthritis of right knee 01/07/2014   Acute blood loss anemia 01/08/2014   Depression    Dementia ([place]) 07/24/2017   Dementia without behavioral disturbance ([place]) 05/04/2019   Late onset Alzheimer's dementia with behavioral disturbance ([place]) 06/19/2020   Anxiety disorder 09/25/2021   Mixed conductive and sensorineural hearing loss, bilateral 11/17/2017   Pure hypercholesterolemia 09/25/2021   Severe dementia with agitation ([place]) 12/06/2021   Disrupted sleep-wake cycle 12/27/2021   Unsteady gait 12/27/2021   Resolved Ambulatory Problems    Diagnosis Date Noted   No Resolved Ambulatory Problems   Past Medical History:  Diagnosis Date   Anxiety    Arthritis    Back pain    Cataracts, bilateral    Encephalitis 1973   Headache(784.0)    Hearing loss    History of blood transfusion    Hypertension    Insomnia    Joint pain    Joint swelling    Memory loss    Nocturia    Osteoarthritis    Rosacea     SOCIAL HX:  Social History   Tobacco Use   Smoking status: Never   Smokeless tobacco: Never  Substance Use Topics   Alcohol use: No     Alcohol/week: 0.0 standard drinks of alcohol     ALLERGIES:  Allergies  Allergen Reactions   Bupropion Other (See Comments)    Body aches    Donepezil Diarrhea   Melatonin Other (See Comments)    Shakes, nervous   Sulfa Antibiotics Hives      PERTINENT MEDICATIONS:  Outpatient Encounter Medications as of 05/21/2022  Medication Sig   ARIPiprazole (ABILIFY) 2 MG tablet Take 2 tablets (4 mg total) by mouth daily.   Ascorbic Acid (VITAMIN C PO) Take by mouth daily.   B Complex-C (SUPER B COMPLEX PO) Take by mouth.   cholecalciferol (VITAMIN D) 1000 UNITS tablet Take 1,000 Units by mouth daily.   Cyanocobalamin (VITAMIN B-12 PO) Take by mouth daily.   Ginkgo Biloba (GINKOBA PO) Take by mouth daily.   memantine (NAMENDA) 10 MG tablet Take 1 tablet (10 mg total) by mouth 2 (two) times daily.   multivitamin (THERAGRAN) per tablet Take 1 tablet by mouth daily.   QUEtiapine (SEROQUEL) 25 MG tablet Take 1 tablet (25 mg total) by mouth at bedtime.   rivastigmine (EXELON) 13.3 MG/24HR Place 1 patch (13.3 mg total) onto the skin daily.   TURMERIC PO Take 1 capsule by mouth daily.   vitamin E 100 UNIT capsule Take 100 Units by mouth daily.   Zinc 50 MG TABS Take 1 tablet by mouth daily.   No facility-administered encounter medications on file as of 05/21/2022.    Thank you for the opportunity to participate in the care of Ms. Goodbar.  The palliative care team will continue to follow. Please call our office at [phone] if we can be of additional assistance.   Hollace Kinnier, DO  COVID-19 PATIENT SCREENING TOOL Asked and negative response unless otherwise noted:  Have you had symptoms of covid, tested positive or been in contact with someone with symptoms/positive test in the past 5-10 days? no

## 2022-05-26 ENCOUNTER — Encounter: Payer: Self-pay | Admitting: Internal Medicine

## 2022-07-15 ENCOUNTER — Emergency Department (HOSPITAL_BASED_OUTPATIENT_CLINIC_OR_DEPARTMENT_OTHER): Payer: PPO | Admitting: Radiology

## 2022-07-15 ENCOUNTER — Encounter (HOSPITAL_BASED_OUTPATIENT_CLINIC_OR_DEPARTMENT_OTHER): Payer: Self-pay | Admitting: Emergency Medicine

## 2022-07-15 ENCOUNTER — Emergency Department (HOSPITAL_BASED_OUTPATIENT_CLINIC_OR_DEPARTMENT_OTHER)
Admission: EM | Admit: 2022-07-15 | Discharge: 2022-07-15 | Disposition: A | Discharge status: Home / Self Care | Payer: PPO | Attending: Emergency Medicine | Admitting: Emergency Medicine

## 2022-07-15 ENCOUNTER — Other Ambulatory Visit: Payer: Self-pay

## 2022-07-15 DIAGNOSIS — R062 Wheezing: Secondary | ICD-10-CM | POA: Diagnosis not present

## 2022-07-15 DIAGNOSIS — Z1152 Encounter for screening for COVID-19: Secondary | ICD-10-CM | POA: Insufficient documentation

## 2022-07-15 DIAGNOSIS — R0602 Shortness of breath: Secondary | ICD-10-CM | POA: Insufficient documentation

## 2022-07-15 DIAGNOSIS — R058 Other specified cough: Secondary | ICD-10-CM | POA: Insufficient documentation

## 2022-07-15 DIAGNOSIS — E119 Type 2 diabetes mellitus without complications: Secondary | ICD-10-CM | POA: Insufficient documentation

## 2022-07-15 DIAGNOSIS — B338 Other specified viral diseases: Secondary | ICD-10-CM | POA: Insufficient documentation

## 2022-07-15 DIAGNOSIS — G309 Alzheimer's disease, unspecified: Secondary | ICD-10-CM | POA: Insufficient documentation

## 2022-07-15 DIAGNOSIS — R059 Cough, unspecified: Secondary | ICD-10-CM | POA: Diagnosis not present

## 2022-07-15 DIAGNOSIS — B974 Respiratory syncytial virus as the cause of diseases classified elsewhere: Secondary | ICD-10-CM | POA: Insufficient documentation

## 2022-07-15 LAB — CBC WITH DIFFERENTIAL/PLATELET
Abs Immature Granulocytes: 0.03 10*3/uL (ref 0.00–0.07)
Basophils Absolute: 0 10*3/uL (ref 0.0–0.1)
Basophils Relative: 1 %
Eosinophils Absolute: 0.1 10*3/uL (ref 0.0–0.5)
Eosinophils Relative: 1 %
HCT: 42.6 % (ref 36.0–46.0)
Hemoglobin: 14.1 g/dL (ref 12.0–15.0)
Immature Granulocytes: 0 %
Lymphocytes Relative: 20 %
Lymphs Abs: 1.4 10*3/uL (ref 0.7–4.0)
MCH: 30.8 pg (ref 26.0–34.0)
MCHC: 33.1 g/dL (ref 30.0–36.0)
MCV: 93 fL (ref 80.0–100.0)
Monocytes Absolute: 0.7 10*3/uL (ref 0.1–1.0)
Monocytes Relative: 11 %
Neutro Abs: 4.5 10*3/uL (ref 1.7–7.7)
Neutrophils Relative %: 67 %
Platelets: 260 10*3/uL (ref 150–400)
RBC: 4.58 MIL/uL (ref 3.87–5.11)
RDW: 12.3 % (ref 11.5–15.5)
WBC: 6.7 10*3/uL (ref 4.0–10.5)
nRBC: 0 % (ref 0.0–0.2)

## 2022-07-15 LAB — BASIC METABOLIC PANEL
Anion gap: 10 (ref 5–15)
BUN: 17 mg/dL (ref 8–23)
CO2: 27 mmol/L (ref 22–32)
Calcium: 9 mg/dL (ref 8.9–10.3)
Chloride: 102 mmol/L (ref 98–111)
Creatinine, Ser: 0.81 mg/dL (ref 0.44–1.00)
GFR, Estimated: >60 mL/min (ref >60)
Glucose, Bld: 121 mg/dL — ABNORMAL HIGH (ref 70–99)
Potassium: 4.1 mmol/L (ref 3.5–5.1)
Sodium: 139 mmol/L (ref 135–145)

## 2022-07-15 LAB — RESP PANEL BY RT-PCR (RSV, FLU A&B, COVID)  RVPGX2
Influenza A by PCR: NEGATIVE
Influenza B by PCR: NEGATIVE
Resp Syncytial Virus by PCR: POSITIVE — AB
SARS Coronavirus 2 by RT PCR: NEGATIVE

## 2022-07-15 LAB — BRAIN NATRIURETIC PEPTIDE: B Natriuretic Peptide: 75.9 pg/mL (ref 0.0–100.0)

## 2022-07-15 MED ORDER — LACTATED RINGERS IV BOLUS
1000.0000 mL | Freq: Once | INTRAVENOUS | Status: AC
  Administered 2022-07-15: 1000 mL via INTRAVENOUS

## 2022-07-15 MED ORDER — IPRATROPIUM-ALBUTEROL 0.5-2.5 (3) MG/3ML IN SOLN
3.0000 mL | Freq: Once | RESPIRATORY_TRACT | Status: AC
  Administered 2022-07-15: 3 mL via RESPIRATORY_TRACT
  Filled 2022-07-15: qty 3

## 2022-07-15 NOTE — ED Provider Notes (Signed)
[facility] EMERGENCY DEPT Provider Note   CSN: [id number] Arrival date & time: 07/15/22  1614     History  Chief Complaint  Patient presents with   Cough   Wheezing   Nasal Congestion    [name] is a 77 y.o. female.  Patient presents the emergency department complaining of coughing, wheezing, and congestion since last Wednesday.  Patient with productive cough with moderate amounts of clear sputum.  Patient has been reportedly been afebrile at home.  Patient's husband is with patient.  Patient is alert and oriented to person which is baseline due to patient's underlying Alzheimer's for the past 8 years.  Patient has been taking Robitussin DM and NyQuil at home.  Past medical history otherwise significant for anxiety disorder, hearing loss, depression  HPI     Home Medications Prior to Admission medications   Medication Sig Start Date End Date Taking? Authorizing Provider  ARIPiprazole (ABILIFY) 2 MG tablet Take 2 tablets (4 mg total) by mouth daily. 12/06/21   Marcial Pacas, MD  Ascorbic Acid (VITAMIN C PO) Take by mouth daily.    [provider]  B Complex-C (SUPER B COMPLEX PO) Take by mouth.    [provider]  cholecalciferol (VITAMIN D) 1000 UNITS tablet Take 1,000 Units by mouth daily.    [provider]  Cyanocobalamin (VITAMIN B-12 PO) Take by mouth daily.    [provider]  Ginkgo Biloba (GINKOBA PO) Take by mouth daily.    [provider]  memantine (NAMENDA) 10 MG tablet Take 1 tablet (10 mg total) by mouth 2 (two) times daily. 12/06/21   Marcial Pacas, MD  multivitamin [facility]) per tablet Take 1 tablet by mouth daily.    [provider]  QUEtiapine (SEROQUEL) 25 MG tablet Take 1 tablet (25 mg total) by mouth at bedtime. 12/18/20   Suzzanne Cloud, NP  rivastigmine (EXELON) 13.3 MG/24HR Place 1 patch (13.3 mg total) onto the skin daily. 02/18/22   Marcial Pacas, MD  TURMERIC PO Take 1 capsule by mouth daily.     [provider]  vitamin E 100 UNIT capsule Take 100 Units by mouth daily.    [provider]  Zinc 50 MG TABS Take 1 tablet by mouth daily.    [provider]      Allergies    Bupropion, Donepezil, and Sulfa antibiotics    Review of Systems   Review of Systems  Constitutional:  Negative for fever.  HENT:  Positive for congestion.   Respiratory:  Positive for cough and wheezing.   Cardiovascular:  Negative for chest pain.  Gastrointestinal:  Negative for abdominal pain, nausea and vomiting.    Physical Exam Updated Vital Signs BP (!) 129/90   Pulse 77   Temp 99.1 F (37.3 C) (Oral)   Resp (!) 24   Ht 5' (1.524 m)   Wt 54.9 kg   SpO2 95%   BMI 23.63 kg/m  Physical Exam Vitals and nursing note reviewed.  Constitutional:      General: She is not in acute distress.    Appearance: She is well-developed.  HENT:     Head: Normocephalic and atraumatic.     Mouth/Throat:     Mouth: Mucous membranes are moist.  Eyes:     Conjunctiva/sclera: Conjunctivae normal.  Cardiovascular:     Rate and Rhythm: Normal rate and regular rhythm.     Heart sounds: No murmur heard. Pulmonary:     Effort: Pulmonary effort  is normal. No respiratory distress.     Breath sounds: Wheezing (Widespread, worse on right side) present.     Comments: Wheezes improved somewhat after DuoNeb. Abdominal:     Palpations: Abdomen is soft.     Tenderness: There is no abdominal tenderness.  Musculoskeletal:        General: No swelling.     Cervical back: Neck supple.  Skin:    General: Skin is warm and dry.     Capillary Refill: Capillary refill takes less than 2 seconds.  Neurological:     Mental Status: She is alert.  Psychiatric:        Mood and Affect: Mood normal.     ED Results / Procedures / Treatments   Labs (all labs ordered are listed, but only abnormal results are displayed) Labs Reviewed  RESP PANEL BY RT-PCR (RSV, FLU A&B, COVID)  RVPGX2 - Abnormal;  Notable for the following components:      Result Value   Resp Syncytial Virus by PCR POSITIVE (*)    All other components within normal limits  BASIC METABOLIC PANEL - Abnormal; Notable for the following components:   Glucose, Bld 121 (*)    All other components within normal limits  CBC WITH DIFFERENTIAL/PLATELET  BRAIN NATRIURETIC PEPTIDE    EKG None  Radiology DG Chest Port 1 View  Result Date: 07/15/2022 CLINICAL DATA:  Cough, wheezing EXAM: PORTABLE CHEST 1 VIEW COMPARISON:  09/05/2015 FINDINGS: Cardiac size is within normal limits. Apparent shift of mediastinum to the right may be due to rotation. Patient's chin is partly obscuring the right apex. There are no signs of pulmonary edema or focal pulmonary consolidation. There is no pleural effusion or pneumothorax. IMPRESSION: No active disease. Electronically Signed   By: Elmer Picker M.D.   On: 07/15/2022 17:22    Procedures Procedures    Medications Ordered in ED Medications  ipratropium-albuterol (DUONEB) 0.5-2.5 (3) MG/3ML nebulizer solution 3 mL (3 mLs Nebulization Given 07/15/22 1725)  lactated ringers bolus 1,000 mL (0 mLs Intravenous Stopped 07/15/22 1933)    ED Course/ Medical Decision Making/ A&P Clinical Course as of 07/15/22 2108  Mon Jul 15, 2022  1720 Stable 76YOF with RSV vitals ok.  [CC]  2094 Reevaled at bedside. Ambulatory sats, 1lVFL and DC. Husband feels good with OP. [CC]    Clinical Course User Index [CC] Tretha Sciara, MD                           Medical Decision Making Amount and/or Complexity of Data Reviewed Labs: ordered. Radiology: ordered.  Risk Prescription drug management.   This patient presents to the ED for concern of wheezing, cough, this involves an extensive number of treatment options, and is a complaint that carries with it a high risk of complications and morbidity.  The differential diagnosis includes RSV, COVID-19, influenza, pneumonia, and others   Co  morbidities that complicate the patient evaluation  History of Alzheimer's   Additional history obtained:  Additional history obtained from patient's husband External records from outside source obtained and reviewed including notes from palliative care for late onset Alzheimer's disease   Lab Tests:  I Ordered, and personally interpreted labs.  The pertinent results include: Patient positive for RSV, BNP 75.9, unremarkable CBC, unremarkable BMP   Imaging Studies ordered:  I ordered imaging studies including chest x-ray I independently visualized and interpreted imaging which showed no active disease I agree with the radiologist  interpretation   Cardiac Monitoring: / EKG:  The patient was maintained on a cardiac monitor.  I personally viewed and interpreted the cardiac monitored which showed an underlying rhythm of: Sinus rhythm   Problem List / ED Course / Critical interventions / Medication management   I ordered medication including DuoNeb and lactated Ringer's bolus  Reevaluation of the patient after these medicines showed that the patient improved I have reviewed the patients home medicines and have made adjustments as needed   Social Determinants of Health:  Patient has Alzheimer's, patient lives with her husband   Test / Admission - Considered:  The patient is feeling better after the DuoNeb.  The patient was able to ambulate without any significant drop in her oxygen saturations.  Her oxygen saturations have been above 90% the entire time while on room air.  She does have a new RSV diagnosis.  At this time she appears healthy enough to go home I see no indication for admission.  Plan to discharge patient home with return precautions including worsening shortness of breath.        Final Clinical Impression(s) / ED Diagnoses Final diagnoses:  RSV (respiratory syncytial virus infection)  Wheezing    Rx / DC Orders ED Discharge Orders     None          Ronny Bacon 07/15/22 2108    Tretha Sciara, MD 07/16/22 1642

## 2022-07-15 NOTE — Discharge Instructions (Signed)
You were diagnosed today with RSV which is a respiratory virus.  There is no specific treatment for this virus at this time.  You may take over-the-counter medications as needed for symptom control.  If your shortness of breath worsens or you develop other life-threatening conditions please return to the emergency department.  Otherwise, please follow-up as needed with your primary care physician

## 2022-07-15 NOTE — ED Notes (Signed)
RT note: Pt. helped out of bed with this RT and Husband in order to check room air pulse ox with activity and was able stand independently without assistance until becoming mildly agitated with no apparent respiratory distress noted results are as followed: Pulse: 80 Sat.: 95% MD made aware along with RN.

## 2022-07-15 NOTE — ED Triage Notes (Signed)
Pt arrived with husband, POV, a/ox1 (Alzheimer's x8 y), c/o coughing, wheezing and congestion since last Wednesday. Productive cough with moderate amounts of clear sputum. Afebrile at home. Pt has been taking Robitussin DM and Nyquil.

## 2022-10-10 DIAGNOSIS — G300 Alzheimer's disease with early onset: Secondary | ICD-10-CM | POA: Diagnosis not present

## 2022-10-10 DIAGNOSIS — F0280 Dementia in other diseases classified elsewhere without behavioral disturbance: Secondary | ICD-10-CM | POA: Diagnosis not present

## 2022-11-06 DIAGNOSIS — L0291 Cutaneous abscess, unspecified: Secondary | ICD-10-CM | POA: Diagnosis not present

## 2022-12-12 ENCOUNTER — Ambulatory Visit: Payer: PPO | Admitting: Neurology

## 2022-12-31 DIAGNOSIS — B029 Zoster without complications: Secondary | ICD-10-CM | POA: Diagnosis not present

## 2023-01-21 ENCOUNTER — Other Ambulatory Visit: Payer: Self-pay | Admitting: Neurology

## 2023-02-10 ENCOUNTER — Telehealth (INDEPENDENT_AMBULATORY_CARE_PROVIDER_SITE_OTHER): Payer: PPO | Admitting: Neurology

## 2023-02-10 DIAGNOSIS — F03C11 Unspecified dementia, severe, with agitation: Secondary | ICD-10-CM

## 2023-02-10 MED ORDER — MEMANTINE HCL 10 MG PO TABS: 10.0000 mg | ORAL_TABLET | Freq: Two times a day (BID) | ORAL | 3 refills | Status: DC

## 2023-02-10 MED ORDER — RIVASTIGMINE 13.3 MG/24HR TD PT24: 13.3000 mg | MEDICATED_PATCH | Freq: Every day | TRANSDERMAL | 3 refills | Status: DC

## 2023-02-10 NOTE — Patient Instructions (Signed)
Great to see you today.  We will continue current medications.  Can continue to follow-up with primary care doctor for further refills.  Return here as needed.  Thanks!!

## 2023-02-10 NOTE — Progress Notes (Signed)
Virtual Visit via Video Note  I connected with [name] on 02/10/23 at  3:30 PM EDT by a video enabled telemedicine application and verified that I am speaking with the correct person using two identifiers.  Location: Patient: at home Provider: in the office    I discussed the limitations of evaluation and management by telemedicine and the availability of in person appointments. The patient expressed understanding and agreed to proceed.  History of Present Illness:  HISTORY  [name] is a 77 years old female, seen in refer by primary care physician Dr. Johny Blamer, for evaluation of memory loss, initial evaluation was on July 24, 2017.   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   She had significant decline since summer 2018, more noticeable word finding difficulties, no longer cooking, her husband has taken financial management at home, she also quit driving a few months ago,   There was no family history of dementia   She has become much less active, tends to watch TV all day long   Laboratory evaluations June 2018, hemoglobin of 14.2, creatinine 0.6, normal TSH 2.39, B12 414   MRI of the brain in July 2018::Generalized atrophy, supratentorium small vessel disease   UPDATE October 22 2017:  Husband reported mild improvement with Namenda 10 mg twice a day, but she could not tolerate Aricept due to GI side effect, diarrhea, she also complains of recent onset of headaches, his right lower abdominal pain, has GI  appointment pending,   Virtual visit on October 27, 2018, I was able to interview patient, with her husband present.  She is overall doing well with current medications Namenda 10 mg twice a day, Exelon patch 9.5 mg daily, she sleeps well, has good appetite   UPDATE May 04 2019: She is with her husband at today's clinical visit, Mini-Mental Status Examination is 15 out of 30, she sleeps well, eats well,   UPDATE Jun 19 2020: She is accompanied by her husband at today's clinical visit, complains of agitation at evening time, using Ativan as needed couple times each months.  She sleeps well most of the time, has good appetite, no significant gait abnormality.   UPDATE Dec 06 2021: She is accompanied by her husband at today's visit, dementia continues to progress, but overall she sleeps well, has good appetite, very hard for her to take a shower, occasionally evening time agitation, previously tried Ativan without significant benefit, Seroquel 25 mg every night works well for her, has only given her occasionally  Husband is tearful, he himself is taking citalopram, he has 5 hours each week now, so he can have some time by himself, patient cannot be left alone at home,  Update February 10, 2023 SS: Here via VV, she had diarrhea today (not typical), memory is worsening, forgets to use fork, trouble processing, needs help with ADLs. Around 4 PM, takes short nap, eat snack. Has good appetite, sleeps very well. Gets frustrated with husband, is short lived, can be calmed down, will pretend to  bite him. He never started the Abilify. Weight is maintaining. [facility], she does well there. Has urinary incontinence at night.    Observations/Objective: Via video visit, husband provides history  Assessment and Plan: 1.  Dementia with agitation  -Doing fairly well [facility], husband is committed to her care -We will continue current medications Exelon patch 13.3 mg  daily, Namenda 10 mg twice daily for memory -Never started Abilify 2 mg, but could utilize if needed -Lorazepam was not beneficial -Will continue to follow with primary care, return here as needed  Follow Up Instructions: As needed   I discussed the assessment and treatment plan with the patient. The patient was provided an opportunity to ask questions and all were answered. The patient agreed with the plan and demonstrated an understanding of the instructions.   The patient was advised to call back or seek an in-person evaluation if the symptoms worsen or if the condition fails to improve as anticipated.  Otila Kluver, DNP  [facility] [address], [address] [place], [place] [address] [phone]

## 2023-04-09 DIAGNOSIS — R8290 Unspecified abnormal findings in urine: Secondary | ICD-10-CM | POA: Diagnosis not present

## 2023-04-15 DIAGNOSIS — Z0000 Encounter for general adult medical examination without abnormal findings: Secondary | ICD-10-CM | POA: Diagnosis not present

## 2023-04-15 DIAGNOSIS — G300 Alzheimer's disease with early onset: Secondary | ICD-10-CM | POA: Diagnosis not present

## 2023-04-15 DIAGNOSIS — Z23 Encounter for immunization: Secondary | ICD-10-CM | POA: Diagnosis not present

## 2023-04-15 DIAGNOSIS — F0280 Dementia in other diseases classified elsewhere without behavioral disturbance: Secondary | ICD-10-CM | POA: Diagnosis not present

## 2023-04-15 DIAGNOSIS — E7800 Pure hypercholesterolemia, unspecified: Secondary | ICD-10-CM | POA: Diagnosis not present

## 2023-04-15 DIAGNOSIS — R399 Unspecified symptoms and signs involving the genitourinary system: Secondary | ICD-10-CM | POA: Diagnosis not present

## 2023-05-12 ENCOUNTER — Emergency Department (HOSPITAL_COMMUNITY): Payer: PPO

## 2023-05-12 ENCOUNTER — Encounter (HOSPITAL_COMMUNITY): Payer: Self-pay | Admitting: Internal Medicine

## 2023-05-12 ENCOUNTER — Inpatient Hospital Stay (HOSPITAL_COMMUNITY)
Admission: EM | Admit: 2023-05-12 | Discharge: 2023-05-14 | DRG: 177 | Disposition: A | Discharge status: Home Health | Payer: PPO | Attending: Internal Medicine | Admitting: Internal Medicine

## 2023-05-12 ENCOUNTER — Other Ambulatory Visit: Payer: Self-pay

## 2023-05-12 DIAGNOSIS — R410 Disorientation, unspecified: Secondary | ICD-10-CM | POA: Diagnosis not present

## 2023-05-12 DIAGNOSIS — U071 COVID-19: Secondary | ICD-10-CM | POA: Diagnosis not present

## 2023-05-12 DIAGNOSIS — R296 Repeated falls: Secondary | ICD-10-CM | POA: Diagnosis not present

## 2023-05-12 DIAGNOSIS — H905 Unspecified sensorineural hearing loss: Secondary | ICD-10-CM | POA: Insufficient documentation

## 2023-05-12 DIAGNOSIS — Z882 Allergy status to sulfonamides status: Secondary | ICD-10-CM

## 2023-05-12 DIAGNOSIS — E872 Acidosis, unspecified: Secondary | ICD-10-CM | POA: Insufficient documentation

## 2023-05-12 DIAGNOSIS — G9341 Metabolic encephalopathy: Secondary | ICD-10-CM

## 2023-05-12 DIAGNOSIS — F411 Generalized anxiety disorder: Secondary | ICD-10-CM | POA: Diagnosis present

## 2023-05-12 DIAGNOSIS — R262 Difficulty in walking, not elsewhere classified: Secondary | ICD-10-CM | POA: Diagnosis not present

## 2023-05-12 DIAGNOSIS — F0280 Dementia in other diseases classified elsewhere without behavioral disturbance: Secondary | ICD-10-CM | POA: Diagnosis present

## 2023-05-12 DIAGNOSIS — G9340 Encephalopathy, unspecified: Principal | ICD-10-CM

## 2023-05-12 DIAGNOSIS — R2681 Unsteadiness on feet: Secondary | ICD-10-CM | POA: Diagnosis present

## 2023-05-12 DIAGNOSIS — Z87898 Personal history of other specified conditions: Secondary | ICD-10-CM | POA: Diagnosis not present

## 2023-05-12 DIAGNOSIS — F0284 Dementia in other diseases classified elsewhere, unspecified severity, with anxiety: Secondary | ICD-10-CM | POA: Diagnosis present

## 2023-05-12 DIAGNOSIS — M4802 Spinal stenosis, cervical region: Secondary | ICD-10-CM | POA: Diagnosis not present

## 2023-05-12 DIAGNOSIS — M47812 Spondylosis without myelopathy or radiculopathy, cervical region: Secondary | ICD-10-CM | POA: Diagnosis not present

## 2023-05-12 DIAGNOSIS — Y92009 Unspecified place in unspecified non-institutional (private) residence as the place of occurrence of the external cause: Secondary | ICD-10-CM

## 2023-05-12 DIAGNOSIS — N179 Acute kidney failure, unspecified: Secondary | ICD-10-CM

## 2023-05-12 DIAGNOSIS — R159 Full incontinence of feces: Secondary | ICD-10-CM | POA: Diagnosis present

## 2023-05-12 DIAGNOSIS — G309 Alzheimer's disease, unspecified: Secondary | ICD-10-CM | POA: Diagnosis present

## 2023-05-12 DIAGNOSIS — M4312 Spondylolisthesis, cervical region: Secondary | ICD-10-CM | POA: Diagnosis not present

## 2023-05-12 DIAGNOSIS — F0390 Unspecified dementia without behavioral disturbance: Secondary | ICD-10-CM | POA: Diagnosis present

## 2023-05-12 DIAGNOSIS — Z96653 Presence of artificial knee joint, bilateral: Secondary | ICD-10-CM | POA: Diagnosis present

## 2023-05-12 DIAGNOSIS — Z043 Encounter for examination and observation following other accident: Secondary | ICD-10-CM | POA: Diagnosis not present

## 2023-05-12 DIAGNOSIS — I6782 Cerebral ischemia: Secondary | ICD-10-CM | POA: Diagnosis not present

## 2023-05-12 DIAGNOSIS — D72829 Elevated white blood cell count, unspecified: Secondary | ICD-10-CM | POA: Diagnosis present

## 2023-05-12 DIAGNOSIS — Z79899 Other long term (current) drug therapy: Secondary | ICD-10-CM

## 2023-05-12 DIAGNOSIS — I10 Essential (primary) hypertension: Secondary | ICD-10-CM | POA: Diagnosis present

## 2023-05-12 DIAGNOSIS — E860 Dehydration: Secondary | ICD-10-CM | POA: Diagnosis present

## 2023-05-12 DIAGNOSIS — R4182 Altered mental status, unspecified: Secondary | ICD-10-CM | POA: Diagnosis not present

## 2023-05-12 DIAGNOSIS — W182XXA Fall in (into) shower or empty bathtub, initial encounter: Secondary | ICD-10-CM | POA: Diagnosis present

## 2023-05-12 DIAGNOSIS — Z8616 Personal history of COVID-19: Secondary | ICD-10-CM

## 2023-05-12 DIAGNOSIS — Z8661 Personal history of infections of the central nervous system: Secondary | ICD-10-CM

## 2023-05-12 DIAGNOSIS — Y93E1 Activity, personal bathing and showering: Secondary | ICD-10-CM

## 2023-05-12 DIAGNOSIS — W19XXXA Unspecified fall, initial encounter: Secondary | ICD-10-CM | POA: Diagnosis not present

## 2023-05-12 DIAGNOSIS — M47816 Spondylosis without myelopathy or radiculopathy, lumbar region: Secondary | ICD-10-CM | POA: Diagnosis present

## 2023-05-12 DIAGNOSIS — R32 Unspecified urinary incontinence: Secondary | ICD-10-CM | POA: Diagnosis present

## 2023-05-12 DIAGNOSIS — I7389 Other specified peripheral vascular diseases: Secondary | ICD-10-CM | POA: Diagnosis present

## 2023-05-12 DIAGNOSIS — F32A Depression, unspecified: Secondary | ICD-10-CM | POA: Diagnosis present

## 2023-05-12 DIAGNOSIS — R9431 Abnormal electrocardiogram [ECG] [EKG]: Secondary | ICD-10-CM | POA: Diagnosis not present

## 2023-05-12 DIAGNOSIS — R059 Cough, unspecified: Secondary | ICD-10-CM | POA: Diagnosis not present

## 2023-05-12 DIAGNOSIS — F03918 Unspecified dementia, unspecified severity, with other behavioral disturbance: Secondary | ICD-10-CM | POA: Diagnosis present

## 2023-05-12 DIAGNOSIS — I480 Paroxysmal atrial fibrillation: Secondary | ICD-10-CM

## 2023-05-12 DIAGNOSIS — I959 Hypotension, unspecified: Secondary | ICD-10-CM | POA: Diagnosis not present

## 2023-05-12 DIAGNOSIS — R9089 Other abnormal findings on diagnostic imaging of central nervous system: Secondary | ICD-10-CM | POA: Diagnosis not present

## 2023-05-12 DIAGNOSIS — R531 Weakness: Secondary | ICD-10-CM | POA: Diagnosis not present

## 2023-05-12 DIAGNOSIS — Z888 Allergy status to other drugs, medicaments and biological substances status: Secondary | ICD-10-CM

## 2023-05-12 DIAGNOSIS — R7989 Other specified abnormal findings of blood chemistry: Secondary | ICD-10-CM | POA: Diagnosis present

## 2023-05-12 LAB — TSH: TSH: 2.952 u[IU]/mL (ref 0.350–4.500)

## 2023-05-12 LAB — TROPONIN I (HIGH SENSITIVITY): Troponin I (High Sensitivity): 9 ng/L (ref <18)

## 2023-05-12 LAB — I-STAT VENOUS BLOOD GAS, ED
Acid-Base Excess: 0 mmol/L (ref 0.0–2.0)
Bicarbonate: 23.3 mmol/L (ref 20.0–28.0)
Calcium, Ion: 1.02 mmol/L — ABNORMAL LOW (ref 1.15–1.40)
HCT: 41 % (ref 36.0–46.0)
Hemoglobin: 13.9 g/dL (ref 12.0–15.0)
O2 Saturation: 81 %
Potassium: 4.8 mmol/L (ref 3.5–5.1)
Sodium: 138 mmol/L (ref 135–145)
TCO2: 24 mmol/L (ref 22–32)
pCO2, Ven: 32.3 mm[Hg] — ABNORMAL LOW (ref 44–60)
pH, Ven: 7.466 — ABNORMAL HIGH (ref 7.25–7.43)
pO2, Ven: 41 mm[Hg] (ref 32–45)

## 2023-05-12 LAB — CBC WITH DIFFERENTIAL/PLATELET
Abs Immature Granulocytes: 0.05 10*3/uL (ref 0.00–0.07)
Basophils Absolute: 0.1 10*3/uL (ref 0.0–0.1)
Basophils Relative: 0 %
Eosinophils Absolute: 0 10*3/uL (ref 0.0–0.5)
Eosinophils Relative: 0 %
HCT: 42.4 % (ref 36.0–46.0)
Hemoglobin: 13.6 g/dL (ref 12.0–15.0)
Immature Granulocytes: 0 %
Lymphocytes Relative: 6 %
Lymphs Abs: 0.7 10*3/uL (ref 0.7–4.0)
MCH: 30.4 pg (ref 26.0–34.0)
MCHC: 32.1 g/dL (ref 30.0–36.0)
MCV: 94.9 fL (ref 80.0–100.0)
Monocytes Absolute: 1.2 10*3/uL — ABNORMAL HIGH (ref 0.1–1.0)
Monocytes Relative: 10 %
Neutro Abs: 9.3 10*3/uL — ABNORMAL HIGH (ref 1.7–7.7)
Neutrophils Relative %: 84 %
Platelets: 308 10*3/uL (ref 150–400)
RBC: 4.47 MIL/uL (ref 3.87–5.11)
RDW: 12 % (ref 11.5–15.5)
WBC: 11.3 10*3/uL — ABNORMAL HIGH (ref 4.0–10.5)
nRBC: 0 % (ref 0.0–0.2)

## 2023-05-12 LAB — RESP PANEL BY RT-PCR (RSV, FLU A&B, COVID)  RVPGX2
Influenza A by PCR: NEGATIVE
Influenza B by PCR: NEGATIVE
Resp Syncytial Virus by PCR: NEGATIVE
SARS Coronavirus 2 by RT PCR: POSITIVE — AB

## 2023-05-12 LAB — BLOOD GAS, VENOUS
Acid-Base Excess: 3.9 mmol/L — ABNORMAL HIGH (ref 0.0–2.0)
Bicarbonate: 28.5 mmol/L — ABNORMAL HIGH (ref 20.0–28.0)
O2 Saturation: 31.9 %
Patient temperature: 37
pCO2, Ven: 42 mm[Hg] — ABNORMAL LOW (ref 44–60)
pH, Ven: 7.44 — ABNORMAL HIGH (ref 7.25–7.43)
pO2, Ven: <31 mm[Hg] — CL (ref 32–45)

## 2023-05-12 LAB — COMPREHENSIVE METABOLIC PANEL
ALT: 23 U/L (ref 0–44)
AST: 70 U/L — ABNORMAL HIGH (ref 15–41)
Albumin: 3.5 g/dL (ref 3.5–5.0)
Alkaline Phosphatase: 63 U/L (ref 38–126)
Anion gap: 14 (ref 5–15)
BUN: 23 mg/dL (ref 8–23)
CO2: 20 mmol/L — ABNORMAL LOW (ref 22–32)
Calcium: 8.7 mg/dL — ABNORMAL LOW (ref 8.9–10.3)
Chloride: 103 mmol/L (ref 98–111)
Creatinine, Ser: 1.01 mg/dL — ABNORMAL HIGH (ref 0.44–1.00)
GFR, Estimated: 57 mL/min — ABNORMAL LOW (ref >60)
Glucose, Bld: 133 mg/dL — ABNORMAL HIGH (ref 70–99)
Potassium: 4.6 mmol/L (ref 3.5–5.1)
Sodium: 137 mmol/L (ref 135–145)
Total Bilirubin: 0.6 mg/dL (ref 0.3–1.2)
Total Protein: 7.1 g/dL (ref 6.5–8.1)

## 2023-05-12 LAB — URINALYSIS, ROUTINE W REFLEX MICROSCOPIC
Bilirubin Urine: NEGATIVE
Glucose, UA: NEGATIVE mg/dL
Hgb urine dipstick: NEGATIVE
Ketones, ur: NEGATIVE mg/dL
Leukocytes,Ua: NEGATIVE
Nitrite: NEGATIVE
Protein, ur: NEGATIVE mg/dL
Specific Gravity, Urine: 1.026 (ref 1.005–1.030)
pH: 5 (ref 5.0–8.0)

## 2023-05-12 LAB — LACTIC ACID, PLASMA
Lactic Acid, Venous: 2.2 mmol/L (ref 0.5–1.9)
Lactic Acid, Venous: 1.4 mmol/L (ref 0.5–1.9)

## 2023-05-12 LAB — MAGNESIUM: Magnesium: 2.2 mg/dL (ref 1.7–2.4)

## 2023-05-12 MED ORDER — LACTATED RINGERS IV BOLUS
1000.0000 mL | Freq: Once | INTRAVENOUS | Status: AC
  Administered 2023-05-12: 1000 mL via INTRAVENOUS

## 2023-05-12 MED ORDER — ACETAMINOPHEN 650 MG RE SUPP: 650.0000 mg | Freq: Four times a day (QID) | RECTAL | Status: DC | PRN

## 2023-05-12 MED ORDER — SODIUM CHLORIDE 0.9 % IV SOLN
250.0000 mL | INTRAVENOUS | Status: DC | PRN
Start: 2023-05-12 — End: 2023-05-13

## 2023-05-12 MED ORDER — ENOXAPARIN SODIUM 30 MG/0.3ML IJ SOSY: 30.0000 mg | PREFILLED_SYRINGE | INTRAMUSCULAR | Status: DC

## 2023-05-12 MED ORDER — SENNOSIDES-DOCUSATE SODIUM 8.6-50 MG PO TABS: 1.0000 | ORAL_TABLET | Freq: Every evening | ORAL | Status: DC | PRN

## 2023-05-12 MED ORDER — DIPHENHYDRAMINE HCL 50 MG/ML IJ SOLN
25.0000 mg | Freq: Once | INTRAMUSCULAR | Status: DC
  Filled 2023-05-12: qty 1

## 2023-05-12 MED ORDER — HEPARIN SOD (PORK) LOCK FLUSH 100 UNIT/ML IV SOLN: 500.0000 [IU] | Freq: Once | INTRAVENOUS | Status: DC

## 2023-05-12 MED ORDER — ACETAMINOPHEN 325 MG PO TABS
650.0000 mg | ORAL_TABLET | Freq: Four times a day (QID) | ORAL | Status: DC | PRN
  Filled 2023-05-12: qty 2

## 2023-05-12 MED ORDER — HALOPERIDOL LACTATE 5 MG/ML IJ SOLN
2.0000 mg | Freq: Once | INTRAMUSCULAR | Status: DC
  Filled 2023-05-12: qty 1

## 2023-05-12 MED ORDER — RIVASTIGMINE 13.3 MG/24HR TD PT24
13.3000 mg | MEDICATED_PATCH | Freq: Every day | TRANSDERMAL | Status: DC
  Administered 2023-05-13: 13.3 mg via TRANSDERMAL
  Filled 2023-05-12 (×2): qty 1

## 2023-05-12 MED ORDER — SODIUM CHLORIDE 0.9% FLUSH
3.0000 mL | Freq: Two times a day (BID) | INTRAVENOUS | Status: DC
  Administered 2023-05-13: 3 mL via INTRAVENOUS

## 2023-05-12 MED ORDER — ONDANSETRON HCL 4 MG PO TABS: 4.0000 mg | ORAL_TABLET | Freq: Four times a day (QID) | ORAL | Status: DC | PRN

## 2023-05-12 MED ORDER — SODIUM CHLORIDE 0.9% FLUSH
3.0000 mL | INTRAVENOUS | Status: DC | PRN
Start: 2023-05-12 — End: 2023-05-13

## 2023-05-12 MED ORDER — MEMANTINE HCL 10 MG PO TABS
10.0000 mg | ORAL_TABLET | Freq: Two times a day (BID) | ORAL | Status: DC
  Administered 2023-05-12 – 2023-05-13 (×3): 10 mg via ORAL
  Filled 2023-05-12 (×2): qty 1

## 2023-05-12 MED ORDER — LACTATED RINGERS IV SOLN: INTRAVENOUS | Status: DC

## 2023-05-12 MED ORDER — ONDANSETRON HCL 4 MG/2ML IJ SOLN: 4.0000 mg | Freq: Four times a day (QID) | INTRAMUSCULAR | Status: DC | PRN

## 2023-05-12 NOTE — ED Provider Notes (Signed)
[place] EMERGENCY DEPARTMENT AT [facility] Provider Note   CSN: [id number] Arrival date & time: 05/12/23  1426     History  No chief complaint on file.   [name] is a 77 y.o. female with PMH Alzheimers (baseline oriented to person only), depression/anxiety, hearing loss who presents with her husband for altered mental status and fall.  Patient is normally oriented to person only but ambulatory, with full bowel and bladder control, and able to feed herself.  2 days ago she was at this baseline.  Yesterday she woke up unable to get out of bed, unable to stand, less interactive, and not able to feed herself.  She was also incontinent of urine and stool since then.  Speech has been decreased in quantity but is not garbled.  No facial droop noted, no prior stroke history.  During this time she has had a cough but no vomiting or diarrhea, no fevers.  Today she fell in the shower and hit her thoracic spine.  No LOC.  No blood thinners.  Husband called EMS. Lives at home with husband.  The history is provided by the patient, the spouse and medical records. The history is limited by the condition of the patient. No language interpreter was used.        Home Medications Prior to Admission medications   Medication Sig Start Date End Date Taking? Authorizing Provider  ARIPiprazole (ABILIFY) 2 MG tablet Take 2 tablets (4 mg total) by mouth daily. 12/06/21   Levert Feinstein, MD  Ascorbic Acid (VITAMIN C PO) Take by mouth daily.    [provider]  B Complex-C (SUPER B COMPLEX PO) Take by mouth.    [provider]  cholecalciferol (VITAMIN D) 1000 UNITS tablet Take 1,000 Units by mouth daily.    [provider]  Cyanocobalamin (VITAMIN B-12 PO) Take by mouth daily.    [provider]  Ginkgo Biloba (GINKOBA PO) Take by mouth daily.    [provider]  memantine (NAMENDA) 10 MG tablet Take 1 tablet (10 mg total) by mouth 2 (two) times daily.  02/10/23   Glean Salvo, NP  multivitamin [facility]) per tablet Take 1 tablet by mouth daily.    [provider]  rivastigmine (EXELON) 13.3 MG/24HR Place 1 patch (13.3 mg total) onto the skin daily. 02/10/23   Glean Salvo, NP  TURMERIC PO Take 1 capsule by mouth daily.    [provider]  vitamin E 100 UNIT capsule Take 100 Units by mouth daily.    [provider]  Zinc 50 MG TABS Take 1 tablet by mouth daily.    [provider]      Allergies    Bupropion, Donepezil, and Sulfa antibiotics    Review of Systems   Review of Systems  Physical Exam Updated Vital Signs BP (!) 126/99 (BP Location: Left Arm)   Pulse 62   Temp 99.4 F (37.4 C) (Axillary)   Resp (!) 22   Ht 5' (1.524 m)   Wt 54 kg   SpO2 99%   BMI 23.25 kg/m  Physical Exam Constitutional:      General: She is not in acute distress.    Appearance: She is ill-appearing.  HENT:     Head: Normocephalic and atraumatic.     Nose: Nose normal.     Mouth/Throat:     Mouth: Mucous membranes are dry.  Eyes:     Extraocular Movements: Extraocular movements intact.  Pupils: Pupils are equal, round, and reactive to light.  Cardiovascular:     Rate and Rhythm: Normal rate. Rhythm irregular.     Pulses: Normal pulses.     Heart sounds: Normal heart sounds.  Pulmonary:     Effort: Pulmonary effort is normal.     Breath sounds: Normal breath sounds.  Abdominal:     General: There is no distension.     Palpations: Abdomen is soft.     Tenderness: There is no abdominal tenderness. There is no guarding.  Musculoskeletal:        General: No tenderness or signs of injury.     Cervical back: Neck supple. No tenderness.     Right lower leg: No edema.     Left lower leg: No edema.     Comments: Mid thoracic spine with mild bruising and tenderness  Skin:    General: Skin is warm and dry.  Neurological:     Mental Status: She is alert. She is disoriented.     Cranial Nerves: No  cranial nerve deficit.     Sensory: No sensory deficit.     Motor: Weakness (generalized) present.     Gait: Abnormal gait: unable to assess gait.     Comments: Normal speech. Disoriented to self, place, and situation, and time.      ED Results / Procedures / Treatments   Labs (all labs ordered are listed, but only abnormal results are displayed) Labs Reviewed  I-STAT VENOUS BLOOD GAS, ED - Abnormal; Notable for the following components:      Result Value   pH, Ven 7.466 (*)    pCO2, Ven 32.3 (*)    Calcium, Ion 1.02 (*)    All other components within normal limits  URINE CULTURE  CBC WITH DIFFERENTIAL/PLATELET  COMPREHENSIVE METABOLIC PANEL  MAGNESIUM  BLOOD GAS, VENOUS  LACTIC ACID, PLASMA  LACTIC ACID, PLASMA  URINALYSIS, ROUTINE W REFLEX MICROSCOPIC  TSH  TROPONIN I (HIGH SENSITIVITY)    EKG None  Radiology No results found.  Procedures Procedures    Medications Ordered in ED Medications - No data to display  ED Course/ Medical Decision Making/ A&P                               Medical Decision Making Amount and/or Complexity of Data Reviewed Independent Historian: spouse Labs: ordered. Decision-making details documented in ED Course. Radiology: ordered and independent interpretation performed. Decision-making details documented in ED Course. ECG/medicine tests: ordered and independent interpretation performed. Decision-making details documented in ED Course.  Risk Decision regarding hospitalization.   77 year old female who presents with acute altered mental status and decrease in activity level.  Differential considered includes infectious encephalopathy secondary to pneumonia, UTI, viral illness, CNS infection; metabolic encephalopathy secondary to electrolyte derangements hypercarbia or acute renal failure, stroke, ICH, MI, also considering acute traumatic injury to the head spine chest abdomen pelvis or extremities from her fall.  EKG, broad  infectious metabolic workup for altered mental status was pursued.  CT head, C-spine, and thoracic spine were obtained as well as a pelvic x-ray to screen for traumatic injuries from her fall as guided by her physical exam.  Full trauma scans were not indicated based on no signs of trauma to the chest abdomen or lumbar spine. Hard cervical collar was placed.   EKG shows Afib with rate control at 77 bpm, bordeline leftward axis, no overt ischemic changes. She  has not previously been diagnosed with Afib per husband, and does not take blood thinners.   Labs show mild leukocytosis of 11.3 with a left shift, normal hemoglobin, normal electrolytes, normal glucose, creatinine of 1.01 similar to previous, metabolic acidosis with bicarb of 20 though on VBG pH is actually alkalotic/compensated at 7.46; lactate of 2.2 initially then 1.4 s/p 1L of fluids, on UTI on UA, normal trop, normal TSH, no signs of UTI on UA  Imaging shows no abnormality on CXR. Pelvic XR neg. CT head without contrast shows no acute abnormality. CT C spine shows no traumatic abnormality; c-collar was removed. CT T spine shows no traumatic fracture or dislocation. MRI Brain without contrast shows no stroke or etiology for her encephalopathy.   COVID flu testing was added.  I reassessed the patient and her family members and discussed above workup with them.  Patient continues to refuse to walk and is still encephalopathic according to family members.  She requires admission for further evaluation for encephalopathy.  Admitted to hospitalist in stable condition.           Final Clinical Impression(s) / ED Diagnoses Final diagnoses:  Altered mental status, unspecified altered mental status type    Rx / DC Orders ED Discharge Orders     None         Karmen Stabs, MD 05/12/23 2307    Melene Plan, DO 05/12/23 2318

## 2023-05-12 NOTE — ED Notes (Signed)
ED TO INPATIENT HANDOFF REPORT  ED Nurse Name and Phone #: Lanora Manis [phone]  S Name/Age/Gender [name] 77 y.o. female Room/Bed: 022C/022C  Code Status   Code Status: Full Code  Home/SNF/Other Home Patient oriented to: pt is not oriented not even to self Is this baseline? Yes   Triage Complete: Triage complete  Chief Complaint Acute metabolic encephalopathy [G93.41]  Triage Note Pt BIB [facility] from home due to fall she had yesterday at home.  Pt lives with husband.  Hx dementia.  VSS.   Allergies Allergies  Allergen Reactions   Aricept [Donepezil] Diarrhea   Sulfa Antibiotics Hives   Wellbutrin [Bupropion] Other (See Comments)    Body aches     Level of Care/Admitting Diagnosis ED Disposition     ED Disposition  Admit   Condition  --   Comment  Hospital Area: [facility] [[id number]]  Level of Care: Telemetry Medical [104]  May place patient in observation at [facility] or [facility] if equivalent level of care is available:: No  Covid Evaluation: Asymptomatic - no recent exposure (last 10 days) testing not required  Diagnosis: Acute metabolic encephalopathy [4540981]  Admitting Physician: Tereasa Coop [[id number]]  Attending Physician: Tereasa Coop [[id number]]          B Medical/Surgery History Past Medical History:  Diagnosis Date   Anxiety    Arthritis    lower back & knees   Back pain    Cataracts, bilateral    Depression    takes Citalopram daily   Encephalitis 1973   Headache(784.0)    occasionally from a fall a month ago   Hearing loss    History of blood transfusion    no abnormal reaction noted   Hypertension    Insomnia    Joint pain    Joint swelling    Memory loss    Nocturia    Osteoarthritis    Rosacea    Past Surgical History:  Procedure Laterality Date   BACK SURGERY  2007   lumbar fusion w/ rods & screws- lower back   COLONOSCOPY     CYST EXCISION  2005   lumbar cyst   JOINT REPLACEMENT   2009   left knee replacement   MASS EXCISION  twice: 08/28/2011; 2012   Procedure: EXCISION MASS;  Surgeon: Nicki Reaper, MD;  Location: [facility];  Service: Orthopedics;  Laterality: Left;  excision mass left ring finger   MASS EXCISION  02/11/2012   Procedure: EXCISION MASS;  Surgeon: Nicki Reaper, MD;  Location: [facility];  Service: Orthopedics;  Laterality: Left;  EXCISION MUCOID CYST, DEBRIDEMENT INTERPHALANGEAL JOINT LEFT THUMB   mass removed from left ring finger  2013   RETINAL DETACHMENT SURGERY Right    SHOULDER ARTHROSCOPY WITH ROTATOR CUFF REPAIR AND SUBACROMIAL DECOMPRESSION Left 07/26/2013   Procedure: LEFT SHOULDER ARTHROSCOPY WITH /SUBACROMIAL DECOMPRESSION AND DISTAL CLAVICLE RESECTION;  Surgeon: Thera Flake., MD;  Location: [facility];  Service: Orthopedics;  Laterality: Left;   TOTAL KNEE ARTHROPLASTY Right 01/07/2014   Procedure: RIGHT TOTAL KNEE ARTHROPLASTY MEDIAL/LATERAL COMPARTMENTS WITH PATELLA RESURFACING;  Surgeon: Thera Flake., MD;  Location: [facility];  Service: Orthopedics;  Laterality: Right;     A IV Location/Drains/Wounds Patient Lines/Drains/Airways Status     Active Line/Drains/Airways     Name Placement date Placement time Site Days   Peripheral IV 05/12/23 20 G Anterior;Left;Proximal Forearm 05/12/23  1522  Forearm  less than 1            Intake/Output Last 24 hours  Intake/Output Summary (Last 24 hours) at 05/12/2023 2206 Last data filed at 05/12/2023 1942 Gross per 24 hour  Intake 1000 ml  Output --  Net 1000 ml    Labs/Imaging Results for orders placed or performed during the hospital encounter of 05/12/23 (from the past 48 hour(s))  Lactic acid, plasma     Status: Abnormal   Collection Time: 05/12/23  3:08 PM  Result Value Ref Range   Lactic Acid, Venous 2.2 (HH) 0.5 - 1.9 mmol/L    Comment: CRITICAL RESULT CALLED TO, READ BACK BY AND VERIFIED WITH MCNEILL H, RN [phone] 05/12/2023 SANDOVAL  K Performed at [facility], [address] [place], [place] [address]   Urinalysis, Routine w reflex microscopic -Urine, Catheterized     Status: None   Collection Time: 05/12/23  3:08 PM  Result Value Ref Range   Color, Urine YELLOW YELLOW   APPearance CLEAR CLEAR   Specific Gravity, Urine 1.026 1.005 - 1.030   pH 5.0 5.0 - 8.0   Glucose, UA NEGATIVE NEGATIVE mg/dL   Hgb urine dipstick NEGATIVE NEGATIVE   Bilirubin Urine NEGATIVE NEGATIVE   Ketones, ur NEGATIVE NEGATIVE mg/dL   Protein, ur NEGATIVE NEGATIVE mg/dL   Nitrite NEGATIVE NEGATIVE   Leukocytes,Ua NEGATIVE NEGATIVE    Comment: Performed at [facility], [address] [place], [place] [address]  CBC with Differential     Status: Abnormal   Collection Time: 05/12/23  3:21 PM  Result Value Ref Range   WBC 11.3 (H) 4.0 - 10.5 K/uL   RBC 4.47 3.87 - 5.11 MIL/uL   Hemoglobin 13.6 12.0 - 15.0 g/dL   HCT 03.4 74.2 - 59.5 %   MCV 94.9 80.0 - 100.0 fL   MCH 30.4 26.0 - 34.0 pg   MCHC 32.1 30.0 - 36.0 g/dL   RDW 63.8 75.6 - 43.3 %   Platelets 308 150 - 400 K/uL    Comment: REPEATED TO VERIFY   nRBC 0.0 0.0 - 0.2 %   Neutrophils Relative % 84 %   Neutro Abs 9.3 (H) 1.7 - 7.7 K/uL   Lymphocytes Relative 6 %   Lymphs Abs 0.7 0.7 - 4.0 K/uL   Monocytes Relative 10 %   Monocytes Absolute 1.2 (H) 0.1 - 1.0 K/uL   Eosinophils Relative 0 %   Eosinophils Absolute 0.0 0.0 - 0.5 K/uL   Basophils Relative 0 %   Basophils Absolute 0.1 0.0 - 0.1 K/uL   Immature Granulocytes 0 %   Abs Immature Granulocytes 0.05 0.00 - 0.07 K/uL    Comment: Performed at [facility], [address] [place], [place] [address]  Comprehensive metabolic panel     Status: Abnormal   Collection Time: 05/12/23  3:21 PM  Result Value Ref Range   Sodium 137 135 - 145 mmol/L   Potassium 4.6 3.5 - 5.1 mmol/L   Chloride 103 98 - 111 mmol/L   CO2 20 (L) 22 - 32 mmol/L   Glucose, Bld 133 (H) 70 - 99 mg/dL    Comment: Glucose reference  range applies only to samples taken after fasting for at least 8 hours.   BUN 23 8 - 23 mg/dL   Creatinine, Ser 8.41 (H) 0.44 - 1.00 mg/dL   Calcium 8.7 (L) 8.9 - 10.3 mg/dL   Total Protein 7.1 6.5 - 8.1 g/dL  Albumin 3.5 3.5 - 5.0 g/dL   AST 70 (H) 15 - 41 U/L   ALT 23 0 - 44 U/L   Alkaline Phosphatase 63 38 - 126 U/L   Total Bilirubin 0.6 0.3 - 1.2 mg/dL   GFR, Estimated 57 (L) >60 mL/min    Comment: (NOTE) Calculated using the CKD-EPI Creatinine Equation (2021)    Anion gap 14 5 - 15    Comment: Performed at [facility], [address] [place], [place] [address]  Magnesium     Status: None   Collection Time: 05/12/23  3:21 PM  Result Value Ref Range   Magnesium 2.2 1.7 - 2.4 mg/dL    Comment: Performed at [facility], [address], [place], [place] [address]  Troponin I (High Sensitivity)     Status: None   Collection Time: 05/12/23  3:21 PM  Result Value Ref Range   Troponin I (High Sensitivity) 9 <18 ng/L    Comment: (NOTE) Elevated high sensitivity troponin I (hsTnI) values and significant  changes across serial measurements may suggest ACS but many other  chronic and acute conditions are known to elevate hsTnI results.  Refer to the "Links" section for chest pain algorithms and additional  guidance. Performed at [facility], [address] [place], [place] [address]   TSH     Status: None   Collection Time: 05/12/23  3:21 PM  Result Value Ref Range   TSH 2.952 0.350 - 4.500 uIU/mL    Comment: Performed by a 3rd Generation assay with a functional sensitivity of <=0.01 uIU/mL. Performed at [facility], [address] [place], [place] [address]   I-Stat venous blood gas, ED     Status: Abnormal   Collection Time: 05/12/23  3:31 PM  Result Value Ref Range   pH, Ven 7.466 (H) 7.25 - 7.43   pCO2, Ven 32.3 (L) 44 - 60 mmHg   pO2, Ven 41 32 - 45 mmHg   Bicarbonate 23.3 20.0 - 28.0 mmol/L   TCO2 24 22 - 32 mmol/L   O2 Saturation 81 %    Acid-Base Excess 0.0 0.0 - 2.0 mmol/L   Sodium 138 135 - 145 mmol/L   Potassium 4.8 3.5 - 5.1 mmol/L   Calcium, Ion 1.02 (L) 1.15 - 1.40 mmol/L   HCT 41.0 36.0 - 46.0 %   Hemoglobin 13.9 12.0 - 15.0 g/dL   Sample type VENOUS   Blood gas, venous     Status: Abnormal   Collection Time: 05/12/23  4:49 PM  Result Value Ref Range   pH, Ven 7.44 (H) 7.25 - 7.43   pCO2, Ven 42 (L) 44 - 60 mmHg   pO2, Ven <31 (LL) 32 - 45 mmHg    Comment: CRITICAL RESULT CALLED TO, READ BACK BY AND VERIFIED WITH: Harmon Dun, RN ON [id number] AT 1710 BY S[employer]LL C[employer]IO    Bicarbonate 28.5 (H) 20.0 - 28.0 mmol/L   Acid-Base Excess 3.9 (H) 0.0 - 2.0 mmol/L   O2 Saturation 31.9 %   Patient temperature 37.0    Collection site VENOUS     Comment: Performed at [facility], [address], [place], [place] [address]  Lactic acid, plasma     Status: None   Collection Time: 05/12/23  5:08 PM  Result Value Ref Range   Lactic Acid, Venous 1.4 0.5 - 1.9 mmol/L    Comment: Performed at [facility], [address] [place], [place] [address]   MR BRAIN WO CONTRAST  Result Date: 05/12/2023 CLINICAL DATA:  Altered mental status and recent fall EXAM: MRI HEAD WITHOUT CONTRAST TECHNIQUE: Multiplanar, multiecho pulse sequences of the brain and surrounding structures were obtained without intravenous contrast. COMPARISON:  01/31/2017 FINDINGS: Brain: No acute infarct, mass effect or extra-axial collection. No acute or chronic hemorrhage. There is multifocal hyperintense T2-weighted signal within the white matter. Generalized volume loss, progressed since 2018. The midline structures are normal. Vascular: Normal flow voids. Skull and upper cervical spine: Normal calvarium and skull base. Visualized upper cervical spine and soft tissues are normal. Sinuses/Orbits:No paranasal sinus fluid levels or advanced mucosal thickening. No mastoid or middle ear effusion. Normal orbits. IMPRESSION: 1. No acute intracranial  abnormality. 2. Generalized volume loss and findings of chronic small vessel ischemia. Electronically Signed   By: Deatra Robinson M.D.   On: 05/12/2023 21:06   CT Thoracic Spine Wo Contrast  Result Date: 05/12/2023 CLINICAL DATA:  Fall EXAM: CT THORACIC SPINE WITHOUT CONTRAST TECHNIQUE: Multidetector CT images of the thoracic were obtained using the standard protocol without intravenous contrast. RADIATION DOSE REDUCTION: This exam was performed according to the departmental dose-optimization program which includes automated exposure control, adjustment of the mA and/or kV according to patient size and/or use of iterative reconstruction technique. COMPARISON:  None Available. FINDINGS: Alignment: Normal. Vertebrae: No acute fracture or focal pathologic process. Paraspinal and other soft tissues: Negative. Disc levels: No spinal canal stenosis IMPRESSION: No acute fracture or static subluxation of the thoracic spine. Electronically Signed   By: Deatra Robinson M.D.   On: 05/12/2023 21:00   CT Head Wo Contrast  Result Date: 05/12/2023 CLINICAL DATA:  Delirium EXAM: CT HEAD WITHOUT CONTRAST CT CERVICAL SPINE WITHOUT CONTRAST TECHNIQUE: Multidetector CT imaging of the head and cervical spine was performed following the standard protocol without intravenous contrast. Multiplanar CT image reconstructions of the cervical spine were also generated. RADIATION DOSE REDUCTION: This exam was performed according to the departmental dose-optimization program which includes automated exposure control, adjustment of the mA and/or kV according to patient size and/or use of iterative reconstruction technique. COMPARISON:  None Available. FINDINGS: CT HEAD FINDINGS Brain: There is no mass, hemorrhage or extra-axial collection. There is generalized atrophy without lobar predilection. There is hypoattenuation of the periventricular white matter, most commonly indicating chronic ischemic microangiopathy. Vascular: No abnormal  hyperdensity of the major intracranial arteries or dural venous sinuses. No intracranial atherosclerosis. Skull: The visualized skull base, calvarium and extracranial soft tissues are normal. Sinuses/Orbits: No fluid levels or advanced mucosal thickening of the visualized paranasal sinuses. No mastoid or middle ear effusion. The orbits are normal. CT CERVICAL SPINE FINDINGS Alignment: Grade 1 anterolisthesis at C3-4 and C4-5 Skull base and vertebrae: No acute fracture. Soft tissues and spinal canal: No prevertebral fluid or swelling. No visible canal hematoma. Disc levels: Multilevel facet arthrosis and disc space narrowing. No high-grade spinal canal stenosis. Upper chest: No pneumothorax, pulmonary nodule or pleural effusion. Other: Normal visualized paraspinal cervical soft tissues. IMPRESSION: 1. No acute intracranial abnormality. 2. Chronic ischemic microangiopathy and generalized atrophy. 3. No acute fracture or static subluxation of the cervical spine. Electronically Signed   By: Deatra Robinson M.D.   On: 05/12/2023 20:58   CT Cervical Spine Wo Contrast  Result Date: 05/12/2023 CLINICAL DATA:  Delirium EXAM: CT HEAD WITHOUT CONTRAST CT CERVICAL SPINE WITHOUT CONTRAST TECHNIQUE: Multidetector CT imaging of the head and cervical spine was performed following the standard protocol without intravenous  contrast. Multiplanar CT image reconstructions of the cervical spine were also generated. RADIATION DOSE REDUCTION: This exam was performed according to the departmental dose-optimization program which includes automated exposure control, adjustment of the mA and/or kV according to patient size and/or use of iterative reconstruction technique. COMPARISON:  None Available. FINDINGS: CT HEAD FINDINGS Brain: There is no mass, hemorrhage or extra-axial collection. There is generalized atrophy without lobar predilection. There is hypoattenuation of the periventricular white matter, most commonly indicating chronic  ischemic microangiopathy. Vascular: No abnormal hyperdensity of the major intracranial arteries or dural venous sinuses. No intracranial atherosclerosis. Skull: The visualized skull base, calvarium and extracranial soft tissues are normal. Sinuses/Orbits: No fluid levels or advanced mucosal thickening of the visualized paranasal sinuses. No mastoid or middle ear effusion. The orbits are normal. CT CERVICAL SPINE FINDINGS Alignment: Grade 1 anterolisthesis at C3-4 and C4-5 Skull base and vertebrae: No acute fracture. Soft tissues and spinal canal: No prevertebral fluid or swelling. No visible canal hematoma. Disc levels: Multilevel facet arthrosis and disc space narrowing. No high-grade spinal canal stenosis. Upper chest: No pneumothorax, pulmonary nodule or pleural effusion. Other: Normal visualized paraspinal cervical soft tissues. IMPRESSION: 1. No acute intracranial abnormality. 2. Chronic ischemic microangiopathy and generalized atrophy. 3. No acute fracture or static subluxation of the cervical spine. Electronically Signed   By: Deatra Robinson M.D.   On: 05/12/2023 20:58   DG Pelvis Portable  Result Date: 05/12/2023 CLINICAL DATA:  Fall EXAM: PORTABLE PELVIS 1-2 VIEWS COMPARISON:  08/28/2006 FINDINGS: There is no evidence of pelvic fracture or diastasis. No pelvic bone lesions are seen. Prior lower lumbar fusion. IMPRESSION: Negative. Electronically Signed   By: Duanne Guess D.O.   On: 05/12/2023 19:06   DG CHEST PORT 1 VIEW  Result Date: 05/12/2023 CLINICAL DATA:  Cough EXAM: PORTABLE CHEST 1 VIEW COMPARISON:  07/15/2022 FINDINGS: The heart size and mediastinal contours are within normal limits. Both lungs are clear. The visualized skeletal structures are unremarkable. IMPRESSION: No active disease. Electronically Signed   By: Jasmine Pang M.D.   On: 05/12/2023 19:01    Pending Labs Unresulted Labs (From admission, onward)     Start     Ordered   05/13/23 0500  Comprehensive metabolic panel   Tomorrow morning,   R        05/12/23 2205   05/13/23 0500  CBC  Tomorrow morning,   R        05/12/23 2205   05/12/23 2205  Creatinine, serum  (enoxaparin (LOVENOX)    CrCl < 30 ml/min)  Once,   R       Comments: Baseline for enoxaparin therapy IF NOT ALREADY DRAWN.    05/12/23 2205   05/12/23 2205  Ammonia  Once,   R        05/12/23 2205   05/12/23 2205  TSH  Once,   R        05/12/23 2205   05/12/23 2205  Vitamin B12  Once,   R        05/12/23 2205   05/12/23 2120  Resp panel by RT-PCR (RSV, Flu A&B, Covid) Anterior Nasal Swab  Once,   URGENT        05/12/23 2120   05/12/23 1509  Urine Culture  Once,   URGENT       Question:  Indication  Answer:  Altered mental status (if no other cause identified)   05/12/23 1508  Vitals/Pain Today's Vitals   05/12/23 1446 05/12/23 1930 05/12/23 1940 05/12/23 2130  BP: (!) 126/99 (!) 140/94  (!) 118/57  Pulse:  68  69  Resp:  (!) 21  (!) 25  Temp:   99.5 F (37.5 C)   TempSrc:   Axillary   SpO2:  97%  96%  Weight:      Height:        Isolation Precautions No active isolations  Medications Medications  memantine (NAMENDA) tablet 10 mg (has no administration in time range)  rivastigmine (EXELON) 13.3 MG/24HR 13.3 mg (has no administration in time range)  enoxaparin (LOVENOX) injection 30 mg (has no administration in time range)  sodium chloride flush (NS) 0.9 % injection 3 mL (has no administration in time range)  sodium chloride flush (NS) 0.9 % injection 3 mL (has no administration in time range)  0.9 %  sodium chloride infusion (has no administration in time range)  acetaminophen (TYLENOL) tablet 650 mg (has no administration in time range)    Or  acetaminophen (TYLENOL) suppository 650 mg (has no administration in time range)  senna-docusate (Senokot-S) tablet 1 tablet (has no administration in time range)  ondansetron (ZOFRAN) tablet 4 mg (has no administration in time range)    Or  ondansetron (ZOFRAN)  injection 4 mg (has no administration in time range)  lactated ringers bolus 1,000 mL (0 mLs Intravenous Stopped 05/12/23 1942)    Mobility walks     Focused Assessments Neuro Assessment Handoff:  Swallow screen pass?  Not done, not ordered, not a stroke pt         Neuro Assessment:   Neuro Checks:      Has TPA been given? No If patient is a Neuro Trauma and patient is going to OR before floor call report to 4N Charge nurse: [phone] or [phone]   R Recommendations: See Admitting Provider Note  Report given to:   Additional Notes: Pt is alert, but not oriented at baseline. Family is present at bedside to answer all questions.

## 2023-05-12 NOTE — ED Notes (Signed)
X-ray at bedside

## 2023-05-12 NOTE — ED Triage Notes (Signed)
Pt BIB [facility] from home due to fall she had yesterday at home.  Pt lives with husband.  Hx dementia.  VSS.

## 2023-05-12 NOTE — ED Notes (Signed)
[place] collar placed on patient per MD [place] order

## 2023-05-12 NOTE — H&P (Signed)
History and Physical    [name] NFA:[id number] DOB: 1946/05/21 DOA: 05/12/2023  PCP: Noberto Retort, MD   Patient coming from: Home   Chief Complaint: Fall at home  ED TRIAGE note:Pt BIB [facility] from home due to fall she had yesterday at home. Pt lives with husband. Hx dementia. VSS.   HPI:  [name] is a 77 y.o. female with medical history significant of Alzheimer disease with dementia (baseline oriented to person only), generalized anxiety disorder, hearing loss, insomnia, and unsteady gait presented to emergency department to emergency department with her husband for altered mental status and after sustained fall.  Patient is normally oriented to person only and ambulatory with with full bowel and bladder control, and able to feed herself.  2 days ago she was at this baseline.  Yesterday she woke up unable to get out of bed, unable to stand, less interactive, and not able to feed herself.  She was also incontinent of urine and stool since then.  Speech has been decreased in quantity but is not garbled.  No facial droop noted, no prior stroke history.  During this time she has had a cough but no vomiting or diarrhea, no fevers.  Today she fell in the shower and hit her thoracic spine.  No LOC.  No blood thinners.  Husband called EMS. Lives at home with husband. The history is provided by the patient, the spouse and medical records.      ED Course:  At presentation to ED patient is hemodynamically stable. Found to have elevated lactic acid 2.2 which improved to 1.4. UA unremarkable.  Pending urine culture. CBC showing mild leukocytosis 11.3 otherwise unremarkable. CMP unremarkable except low bicarb 20, elevated creatinine 1.01 slightly evaded AST 70 otherwise unremarkable. Troponin 9 WNL. TSH within normal range. VBG showed pH 7.4, pCO2 42/low pCO2 31 and elevated bicarb 28.Marland Kitchen  Extensive imaging in the ED following. X-ray pelvis no acute fracture. X-ray chest no active  disease. CT head no acute abnormality.  Chronic ischemic microangiopathy and generalized atrophy. CT cervical spine no fracture or subluxation. CT thoracic spine no acute fracture or subluxation. MRI of the brain no acute abnormality.  Generalized volume loss.  Per ED physician patient patient is alert but not at her baseline and family is very concerned.  ED questing admission for workup for acute physician metabolic encephalopathy, evaluation for recurrent fall and acute kidney injury.  Review of Systems:  Review of Systems  Constitutional:  Negative for chills, fever, malaise/fatigue and weight loss.  HENT:  Negative for hearing loss.   Eyes:  Negative for blurred vision.  Respiratory:  Negative for cough, sputum production and shortness of breath.   Cardiovascular:  Negative for chest pain, orthopnea and leg swelling.  Gastrointestinal:  Negative for abdominal pain, diarrhea, heartburn, nausea and vomiting.  Genitourinary:  Negative for dysuria, frequency and urgency.  Musculoskeletal:  Negative for myalgias and neck pain.  Neurological:  Positive for tremors. Negative for dizziness, speech change, focal weakness, seizures, loss of consciousness, weakness and headaches.  Psychiatric/Behavioral:  The patient is not nervous/anxious.     Past Medical History:  Diagnosis Date   Anxiety    Arthritis    lower back & knees   Back pain    Cataracts, bilateral    Depression    takes Citalopram Ballard   Encephalitis 1973   Headache(784.0)    occasionally from a fall a month ago   Hearing loss    History of blood  transfusion    no abnormal reaction noted   Hypertension    Insomnia    Joint pain    Joint swelling    Memory loss    Nocturia    Osteoarthritis    Rosacea     Past Surgical History:  Procedure Laterality Date   BACK SURGERY  2007   lumbar fusion w/ rods & screws- lower back   COLONOSCOPY     CYST EXCISION  2005   lumbar cyst   JOINT REPLACEMENT  2009   left  knee replacement   MASS EXCISION  twice: 08/28/2011; 2012   Procedure: EXCISION MASS;  Surgeon: Nicki Reaper, MD;  Location: [facility];  Service: Orthopedics;  Laterality: Left;  excision mass left ring finger   MASS EXCISION  02/11/2012   Procedure: EXCISION MASS;  Surgeon: Nicki Reaper, MD;  Location: [facility];  Service: Orthopedics;  Laterality: Left;  EXCISION MUCOID CYST, DEBRIDEMENT INTERPHALANGEAL JOINT LEFT THUMB   mass removed from left ring finger  2013   RETINAL DETACHMENT SURGERY Right    SHOULDER ARTHROSCOPY WITH ROTATOR CUFF REPAIR AND SUBACROMIAL DECOMPRESSION Left 07/26/2013   Procedure: LEFT SHOULDER ARTHROSCOPY WITH /SUBACROMIAL DECOMPRESSION AND DISTAL CLAVICLE RESECTION;  Surgeon: Thera Flake., MD;  Location: [facility];  Service: Orthopedics;  Laterality: Left;   TOTAL KNEE ARTHROPLASTY Right 01/07/2014   Procedure: RIGHT TOTAL KNEE ARTHROPLASTY MEDIAL/LATERAL COMPARTMENTS WITH PATELLA RESURFACING;  Surgeon: Thera Flake., MD;  Location: [facility];  Service: Orthopedics;  Laterality: Right;     reports that she has never smoked. She has never used smokeless tobacco. She reports that she does not drink alcohol and does not use drugs.  Allergies  Allergen Reactions   Aricept [Donepezil] Diarrhea   Sulfa Antibiotics Hives   Wellbutrin [Bupropion] Other (See Comments)    Body aches     Family History  Problem Relation Age of Onset   Pancreatic cancer Mother    Alcohol abuse Father     Prior to Admission medications   Medication Sig Start Date End Date Taking? Authorizing Provider  Ascorbic Acid (VITAMIN C PO) Take by mouth Ballard.   Yes [provider]  Cholecalciferol (VITAMIN D-3 PO) Take 1 capsule by mouth Ballard.   Yes [provider]  Cyanocobalamin (VITAMIN B-12 PO) Take 1 tablet by mouth Ballard.   Yes [provider]  Ginkgo Biloba (GINKOBA PO) Take 1 capsule by mouth Ballard.   Yes [provider]  memantine (NAMENDA) 10 MG tablet Take 1 tablet (10 mg total) by mouth 2 (two) times Ballard. 02/10/23  Yes Glean Salvo, NP  rivastigmine (EXELON) 13.3 MG/24HR Place 1 patch (13.3 mg total) onto the skin Ballard. 02/10/23  Yes Glean Salvo, NP     Physical Exam: Vitals:   05/12/23 1446 05/12/23 1930 05/12/23 1940 05/12/23 2130  BP: (!) 126/99 (!) 140/94  (!) 118/57  Pulse:  68  69  Resp:  (!) 21  (!) 25  Temp:   99.5 F (37.5 C)   TempSrc:   Axillary   SpO2:  97%  96%  Weight:      Height:        Physical Exam HENT:     Head: Normocephalic and atraumatic.     Nose: Nose normal.     Mouth/Throat:     Mouth: Mucous membranes are dry.  Eyes:     Pupils: Pupils are equal,  round, and reactive to light.  Cardiovascular:     Rate and Rhythm: Normal rate and regular rhythm.     Pulses: Normal pulses.     Heart sounds: Normal heart sounds.  Abdominal:     General: Bowel sounds are normal.  Musculoskeletal:     Cervical back: Neck supple.     Right lower leg: No edema.     Left lower leg: No edema.  Skin:    Capillary Refill: Capillary refill takes less than 2 seconds.  Neurological:     Comments: Patient is alert but not oriented to self, place and time.  Psychiatric:     Comments: Unable to assess      Labs on Admission: I have personally reviewed following labs and imaging studies  CBC: Recent Labs  Lab 05/12/23 1521 05/12/23 1531  WBC 11.3*  --   NEUTROABS 9.3*  --   HGB 13.6 13.9  HCT 42.4 41.0  MCV 94.9  --   PLT 308  --    Basic Metabolic Panel: Recent Labs  Lab 05/12/23 1521 05/12/23 1531  NA 137 138  K 4.6 4.8  CL 103  --   CO2 20*  --   GLUCOSE 133*  --   BUN 23  --   CREATININE 1.01*  --   CALCIUM 8.7*  --   MG 2.2  --    GFR: Estimated Creatinine Clearance: 32.4 mL/min (A) (by C-G formula based on SCr of 1.01 mg/dL (H)). Liver Function Tests: Recent Labs  Lab 05/12/23 1521  AST 70*  ALT 23  ALKPHOS 63  BILITOT 0.6   PROT 7.1  ALBUMIN 3.5   No results for input(s): "LIPASE", "AMYLASE" in the last 168 hours. No results for input(s): "AMMONIA" in the last 168 hours. Coagulation Profile: No results for input(s): "INR", "PROTIME" in the last 168 hours. Cardiac Enzymes: Recent Labs  Lab 05/12/23 1521  TROPONINIHS 9   BNP (last 3 results) Recent Labs    07/15/22 1656  BNP 75.9   HbA1C: No results for input(s): "HGBA1C" in the last 72 hours. CBG: No results for input(s): "GLUCAP" in the last 168 hours. Lipid Profile: No results for input(s): "CHOL", "HDL", "LDLCALC", "TRIG", "CHOLHDL", "LDLDIRECT" in the last 72 hours. Thyroid Function Tests: Recent Labs    05/12/23 1521  TSH 2.952   Anemia Panel: No results for input(s): "VITAMINB12", "FOLATE", "FERRITIN", "TIBC", "IRON", "RETICCTPCT" in the last 72 hours. Urine analysis:    Component Value Date/Time   COLORURINE YELLOW 05/12/2023 1508   APPEARANCEUR CLEAR 05/12/2023 1508   LABSPEC 1.026 05/12/2023 1508   PHURINE 5.0 05/12/2023 1508   GLUCOSEU NEGATIVE 05/12/2023 1508   HGBUR NEGATIVE 05/12/2023 1508   BILIRUBINUR NEGATIVE 05/12/2023 1508   KETONESUR NEGATIVE 05/12/2023 1508   PROTEINUR NEGATIVE 05/12/2023 1508   UROBILINOGEN 0.2 12/28/2013 1134   NITRITE NEGATIVE 05/12/2023 1508   LEUKOCYTESUR NEGATIVE 05/12/2023 1508    Radiological Exams on Admission: I have personally reviewed images MR BRAIN WO CONTRAST  Result Date: 05/12/2023 CLINICAL DATA:  Altered mental status and recent fall EXAM: MRI HEAD WITHOUT CONTRAST TECHNIQUE: Multiplanar, multiecho pulse sequences of the brain and surrounding structures were obtained without intravenous contrast. COMPARISON:  01/31/2017 FINDINGS: Brain: No acute infarct, mass effect or extra-axial collection. No acute or chronic hemorrhage. There is multifocal hyperintense T2-weighted signal within the white matter. Generalized volume loss, progressed since 2018. The midline structures are  normal. Vascular: Normal flow voids. Skull and upper cervical spine:  Normal calvarium and skull base. Visualized upper cervical spine and soft tissues are normal. Sinuses/Orbits:No paranasal sinus fluid levels or advanced mucosal thickening. No mastoid or middle ear effusion. Normal orbits. IMPRESSION: 1. No acute intracranial abnormality. 2. Generalized volume loss and findings of chronic small vessel ischemia. Electronically Signed   By: Deatra Robinson M.D.   On: 05/12/2023 21:06   CT Thoracic Spine Wo Contrast  Result Date: 05/12/2023 CLINICAL DATA:  Fall EXAM: CT THORACIC SPINE WITHOUT CONTRAST TECHNIQUE: Multidetector CT images of the thoracic were obtained using the standard protocol without intravenous contrast. RADIATION DOSE REDUCTION: This exam was performed according to the departmental dose-optimization program which includes automated exposure control, adjustment of the mA and/or kV according to patient size and/or use of iterative reconstruction technique. COMPARISON:  None Available. FINDINGS: Alignment: Normal. Vertebrae: No acute fracture or focal pathologic process. Paraspinal and other soft tissues: Negative. Disc levels: No spinal canal stenosis IMPRESSION: No acute fracture or static subluxation of the thoracic spine. Electronically Signed   By: Deatra Robinson M.D.   On: 05/12/2023 21:00   CT Head Wo Contrast  Result Date: 05/12/2023 CLINICAL DATA:  Delirium EXAM: CT HEAD WITHOUT CONTRAST CT CERVICAL SPINE WITHOUT CONTRAST TECHNIQUE: Multidetector CT imaging of the head and cervical spine was performed following the standard protocol without intravenous contrast. Multiplanar CT image reconstructions of the cervical spine were also generated. RADIATION DOSE REDUCTION: This exam was performed according to the departmental dose-optimization program which includes automated exposure control, adjustment of the mA and/or kV according to patient size and/or use of iterative reconstruction  technique. COMPARISON:  None Available. FINDINGS: CT HEAD FINDINGS Brain: There is no mass, hemorrhage or extra-axial collection. There is generalized atrophy without lobar predilection. There is hypoattenuation of the periventricular white matter, most commonly indicating chronic ischemic microangiopathy. Vascular: No abnormal hyperdensity of the major intracranial arteries or dural venous sinuses. No intracranial atherosclerosis. Skull: The visualized skull base, calvarium and extracranial soft tissues are normal. Sinuses/Orbits: No fluid levels or advanced mucosal thickening of the visualized paranasal sinuses. No mastoid or middle ear effusion. The orbits are normal. CT CERVICAL SPINE FINDINGS Alignment: Grade 1 anterolisthesis at C3-4 and C4-5 Skull base and vertebrae: No acute fracture. Soft tissues and spinal canal: No prevertebral fluid or swelling. No visible canal hematoma. Disc levels: Multilevel facet arthrosis and disc space narrowing. No high-grade spinal canal stenosis. Upper chest: No pneumothorax, pulmonary nodule or pleural effusion. Other: Normal visualized paraspinal cervical soft tissues. IMPRESSION: 1. No acute intracranial abnormality. 2. Chronic ischemic microangiopathy and generalized atrophy. 3. No acute fracture or static subluxation of the cervical spine. Electronically Signed   By: Deatra Robinson M.D.   On: 05/12/2023 20:58   CT Cervical Spine Wo Contrast  Result Date: 05/12/2023 CLINICAL DATA:  Delirium EXAM: CT HEAD WITHOUT CONTRAST CT CERVICAL SPINE WITHOUT CONTRAST TECHNIQUE: Multidetector CT imaging of the head and cervical spine was performed following the standard protocol without intravenous contrast. Multiplanar CT image reconstructions of the cervical spine were also generated. RADIATION DOSE REDUCTION: This exam was performed according to the departmental dose-optimization program which includes automated exposure control, adjustment of the mA and/or kV according to  patient size and/or use of iterative reconstruction technique. COMPARISON:  None Available. FINDINGS: CT HEAD FINDINGS Brain: There is no mass, hemorrhage or extra-axial collection. There is generalized atrophy without lobar predilection. There is hypoattenuation of the periventricular white matter, most commonly indicating chronic ischemic microangiopathy. Vascular: No abnormal hyperdensity of the major intracranial  arteries or dural venous sinuses. No intracranial atherosclerosis. Skull: The visualized skull base, calvarium and extracranial soft tissues are normal. Sinuses/Orbits: No fluid levels or advanced mucosal thickening of the visualized paranasal sinuses. No mastoid or middle ear effusion. The orbits are normal. CT CERVICAL SPINE FINDINGS Alignment: Grade 1 anterolisthesis at C3-4 and C4-5 Skull base and vertebrae: No acute fracture. Soft tissues and spinal canal: No prevertebral fluid or swelling. No visible canal hematoma. Disc levels: Multilevel facet arthrosis and disc space narrowing. No high-grade spinal canal stenosis. Upper chest: No pneumothorax, pulmonary nodule or pleural effusion. Other: Normal visualized paraspinal cervical soft tissues. IMPRESSION: 1. No acute intracranial abnormality. 2. Chronic ischemic microangiopathy and generalized atrophy. 3. No acute fracture or static subluxation of the cervical spine. Electronically Signed   By: Deatra Robinson M.D.   On: 05/12/2023 20:58   DG Pelvis Portable  Result Date: 05/12/2023 CLINICAL DATA:  Fall EXAM: PORTABLE PELVIS 1-2 VIEWS COMPARISON:  08/28/2006 FINDINGS: There is no evidence of pelvic fracture or diastasis. No pelvic bone lesions are seen. Prior lower lumbar fusion. IMPRESSION: Negative. Electronically Signed   By: Duanne Guess D.O.   On: 05/12/2023 19:06   DG CHEST PORT 1 VIEW  Result Date: 05/12/2023 CLINICAL DATA:  Cough EXAM: PORTABLE CHEST 1 VIEW COMPARISON:  07/15/2022 FINDINGS: The heart size and mediastinal contours  are within normal limits. Both lungs are clear. The visualized skeletal structures are unremarkable. IMPRESSION: No active disease. Electronically Signed   By: Jasmine Pang M.D.   On: 05/12/2023 19:01    EKG: Normal sinus rhythm heart rate 77.  There is no ST-T wave abnormality.  (Verified with EKG reading with cardiology as the EKG stated as atrial fibrillation which is likely the artifact)   Assessment/Plan: Principal Problem:   Acute metabolic encephalopathy Active Problems:   AKI (acute kidney injury) (HCC)   Recurrent falls   Dementia without behavioral disturbance (HCC)   Generalized anxiety disorder   Alzheimer disease (HCC)   History of unsteady gait   Sensorineural deafness    Assessment and Plan: Acute metabolic encephalopathy -Patient presenting to the ED with her husband for evaluation for unsteady gait, recurrent fall and change of her ADLS for last 2 days.  Patient's daughter and husband worried that she is not able to change her diaper for last 2 days having urinary incontinence, and having generalized shakiness and recurrent fall.  She is eating and drinking well at home.  Reported exposed to COVID 7 days ago. - Initial lactic acid was 2.2 which improved to 1.4 with 1 L of bolus in the ED.  Patient is afebrile.  Mild leukocytosis 11.3. -X-ray chest, CT head, MRI of the head, CT thoracic and lumbar spine all negative for any acute disease process and fracture. - Physical exam patient is completely stable.  Patient is alert but not oriented to self, place and time.  Fluctuating memory. - Based on my evaluation patient's developing progressive declining of Alzheimer disease and dementia rather than actually having an any acute metabolic syndrome relating memory change but is still checking TSH, ammonia and vitamin B12 level - Admitting patient for observation overnight.  PT and OT evaluation in the daytime - Need to follow-up with TSH, ammonia and B12 level - Continue fall  precaution - Continue neurocheck every 4 hours - Continue delirium precaution -Continue cardiac monitoring.  Lactic acidosis secondary to dehydration - Initial lactic acid 2.2 which improved to 1.4 with 1 L of LR bolus in the  ED.  Lactic acidosis in the setting of dehydration rather than any acute disease process at this time.  Recurrent fall History of unsteady gait -Patient has multiple falls at home for last 2 days.  At baseline she has unsteady gait and tremor.  Family is worried about her worsening unsteady gait. - Unsteady gait likely secondary to in the setting of Alzheimer dementia associated gait issues. -Consulted inpatient PT and OT for evaluation for balance.  Acute kidney injury - Creatinine 1.01 on presentation.  At baseline normal renal function.  Acute kidney injury in the setting of poor oral intake and dehydration. - In the ED got 1 L of bolus.  Continue LR 75 cc/h for 1 day.  Family at the bedside helping with feeding and keeping patient hydrated. -Continue to monitor renal function, hyper nephrotoxic agent and monitor urine output.  Alzheimer disease and dementia without behavioral disturbance -Continue memantine 10 mg twice Ballard and rivastigmine patch once Ballard.  Sensorineural deafness -Patient has problem hard hearing.  Need outpatient ENT referral.    DVT prophylaxis:  Lovenox Code Status:  Full Code.  Verified CODE STATUS with patient husband and daughter at the bedside.  Patient's family has been wished for for chest compression, mechanical ventilation and intubation in worst case scenario. Diet: Heart healthy diet Family Communication:  Family was present at bedside, at the time of interview.  Opportunity was given to ask question and all questions were answered satisfactorily.  Disposition Plan: Tentative discharge to home next 1 to 2 days. Consults: PT and OT Admission status:   Inpatient, Telemetry bed  Severity of Illness: The appropriate patient  status for this patient is OBSERVATION. Observation status is judged to be reasonable and necessary in order to provide the required intensity of service to ensure the patient's safety. The patient's presenting symptoms, physical exam findings, and initial radiographic and laboratory data in the context of their medical condition is felt to place them at decreased risk for further clinical deterioration. Furthermore, it is anticipated that the patient will be medically stable for discharge from the hospital within 2 midnights of admission.     Tereasa Coop, MD [facility]  How to contact the [facility] Attending or Consulting provider 7A - 7P or covering provider during after hours 7P -7A, for this patient.  Check the care team in [facility] and look for a) attending/consulting [facility] provider listed and b) the [facility] team listed Log into [email or web address] and use [facility] universal password to access. If you do not have the password, please contact the hospital operator. Locate the [facility] provider you are looking for under [facility] and page to a number that you can be directly reached. If you still have difficulty reaching the provider, please page the [facility] (Director on Call) for the Hospitalists listed on amion for assistance.  05/12/2023, 10:38 PM

## 2023-05-13 DIAGNOSIS — R2681 Unsteadiness on feet: Secondary | ICD-10-CM | POA: Diagnosis not present

## 2023-05-13 DIAGNOSIS — E872 Acidosis, unspecified: Secondary | ICD-10-CM | POA: Diagnosis not present

## 2023-05-13 DIAGNOSIS — E860 Dehydration: Secondary | ICD-10-CM | POA: Diagnosis not present

## 2023-05-13 DIAGNOSIS — R159 Full incontinence of feces: Secondary | ICD-10-CM | POA: Diagnosis not present

## 2023-05-13 DIAGNOSIS — F0280 Dementia in other diseases classified elsewhere without behavioral disturbance: Secondary | ICD-10-CM | POA: Diagnosis not present

## 2023-05-13 DIAGNOSIS — Z8661 Personal history of infections of the central nervous system: Secondary | ICD-10-CM | POA: Diagnosis not present

## 2023-05-13 DIAGNOSIS — F411 Generalized anxiety disorder: Secondary | ICD-10-CM | POA: Diagnosis not present

## 2023-05-13 DIAGNOSIS — R32 Unspecified urinary incontinence: Secondary | ICD-10-CM | POA: Diagnosis not present

## 2023-05-13 DIAGNOSIS — G309 Alzheimer's disease, unspecified: Secondary | ICD-10-CM | POA: Diagnosis not present

## 2023-05-13 DIAGNOSIS — M47816 Spondylosis without myelopathy or radiculopathy, lumbar region: Secondary | ICD-10-CM | POA: Diagnosis not present

## 2023-05-13 DIAGNOSIS — I7389 Other specified peripheral vascular diseases: Secondary | ICD-10-CM | POA: Diagnosis not present

## 2023-05-13 DIAGNOSIS — Z882 Allergy status to sulfonamides status: Secondary | ICD-10-CM | POA: Diagnosis not present

## 2023-05-13 DIAGNOSIS — Z96653 Presence of artificial knee joint, bilateral: Secondary | ICD-10-CM | POA: Diagnosis not present

## 2023-05-13 DIAGNOSIS — Y93E1 Activity, personal bathing and showering: Secondary | ICD-10-CM | POA: Diagnosis not present

## 2023-05-13 DIAGNOSIS — Z8616 Personal history of COVID-19: Secondary | ICD-10-CM | POA: Diagnosis not present

## 2023-05-13 DIAGNOSIS — G9341 Metabolic encephalopathy: Secondary | ICD-10-CM | POA: Diagnosis not present

## 2023-05-13 DIAGNOSIS — U071 COVID-19: Secondary | ICD-10-CM | POA: Diagnosis not present

## 2023-05-13 DIAGNOSIS — N179 Acute kidney failure, unspecified: Secondary | ICD-10-CM | POA: Diagnosis not present

## 2023-05-13 DIAGNOSIS — I10 Essential (primary) hypertension: Secondary | ICD-10-CM | POA: Diagnosis not present

## 2023-05-13 DIAGNOSIS — R7989 Other specified abnormal findings of blood chemistry: Secondary | ICD-10-CM | POA: Diagnosis not present

## 2023-05-13 DIAGNOSIS — F32A Depression, unspecified: Secondary | ICD-10-CM | POA: Diagnosis not present

## 2023-05-13 DIAGNOSIS — W182XXA Fall in (into) shower or empty bathtub, initial encounter: Secondary | ICD-10-CM | POA: Diagnosis present

## 2023-05-13 DIAGNOSIS — H905 Unspecified sensorineural hearing loss: Secondary | ICD-10-CM | POA: Diagnosis not present

## 2023-05-13 DIAGNOSIS — F0284 Dementia in other diseases classified elsewhere, unspecified severity, with anxiety: Secondary | ICD-10-CM | POA: Diagnosis not present

## 2023-05-13 DIAGNOSIS — Y92009 Unspecified place in unspecified non-institutional (private) residence as the place of occurrence of the external cause: Secondary | ICD-10-CM | POA: Diagnosis not present

## 2023-05-13 DIAGNOSIS — D72829 Elevated white blood cell count, unspecified: Secondary | ICD-10-CM | POA: Diagnosis not present

## 2023-05-13 DIAGNOSIS — R4182 Altered mental status, unspecified: Secondary | ICD-10-CM | POA: Diagnosis present

## 2023-05-13 DIAGNOSIS — R296 Repeated falls: Secondary | ICD-10-CM | POA: Diagnosis not present

## 2023-05-13 DIAGNOSIS — Z888 Allergy status to other drugs, medicaments and biological substances status: Secondary | ICD-10-CM | POA: Diagnosis not present

## 2023-05-13 LAB — COMPREHENSIVE METABOLIC PANEL
ALT: 24 U/L (ref 0–44)
AST: 68 U/L — ABNORMAL HIGH (ref 15–41)
Albumin: 2.8 g/dL — ABNORMAL LOW (ref 3.5–5.0)
Alkaline Phosphatase: 53 U/L (ref 38–126)
Anion gap: 7 (ref 5–15)
BUN: 15 mg/dL (ref 8–23)
CO2: 26 mmol/L (ref 22–32)
Calcium: 8.3 mg/dL — ABNORMAL LOW (ref 8.9–10.3)
Chloride: 107 mmol/L (ref 98–111)
Creatinine, Ser: 0.86 mg/dL (ref 0.44–1.00)
GFR, Estimated: >60 mL/min (ref >60)
Glucose, Bld: 109 mg/dL — ABNORMAL HIGH (ref 70–99)
Potassium: 3.9 mmol/L (ref 3.5–5.1)
Sodium: 140 mmol/L (ref 135–145)
Total Bilirubin: 0.6 mg/dL (ref 0.3–1.2)
Total Protein: 5.8 g/dL — ABNORMAL LOW (ref 6.5–8.1)

## 2023-05-13 LAB — CBC
HCT: 35.8 % — ABNORMAL LOW (ref 36.0–46.0)
Hemoglobin: 11.9 g/dL — ABNORMAL LOW (ref 12.0–15.0)
MCH: 30.4 pg (ref 26.0–34.0)
MCHC: 33.2 g/dL (ref 30.0–36.0)
MCV: 91.6 fL (ref 80.0–100.0)
Platelets: 269 10*3/uL (ref 150–400)
RBC: 3.91 MIL/uL (ref 3.87–5.11)
RDW: 12.1 % (ref 11.5–15.5)
WBC: 8.6 10*3/uL (ref 4.0–10.5)
nRBC: 0 % (ref 0.0–0.2)

## 2023-05-13 LAB — URINE CULTURE: Culture: NO GROWTH

## 2023-05-13 LAB — TSH: TSH: 4.946 u[IU]/mL — ABNORMAL HIGH (ref 0.350–4.500)

## 2023-05-13 LAB — VITAMIN B12: Vitamin B-12: 6948 pg/mL — ABNORMAL HIGH (ref 180–914)

## 2023-05-13 LAB — AMMONIA: Ammonia: 12 umol/L (ref 9–35)

## 2023-05-13 MED ORDER — ENOXAPARIN SODIUM 40 MG/0.4ML IJ SOSY: 40.0000 mg | PREFILLED_SYRINGE | INTRAMUSCULAR | Status: DC

## 2023-05-13 NOTE — Progress Notes (Signed)
This nurse approached by patient's husband asking if she would be getting her evening dose of nemenda.  Husband was informed that her next dose was scheduled for 9pm and would be given by the next shift, to which he replied "ok I will just give it to her now.  I dont want her  taking any medications from the hospital  because Im not going to pay for them when I have them right here".  He then took a large pill box full of meds from a duffle bag and began looking for the nemenda.  This nurse informed him that per hospital and pharmacy hospital any medications stocked in our pharmacy must be given from our supply and not from home supply, to which he responded again that he was not going to pay for hospital meds when he already has everything the patient needs and he will give them to her at the times she takes them at home.  Charge nurse Hessie Diener aware, Dr Jerral Ralph aware.

## 2023-05-13 NOTE — Progress Notes (Signed)
I talked to Michelle Piper (patient's husband) and explained the policy on home meds. He was agreeable to let them be verified by pharmacist if they are to be used in the future, instead of using hospital supply. Patient is supposed to be discharged tomorrow morning, so she should not need anymore of these meds before she goes home. Instructed husband to take the meds home and in the future, let hospital staff verify home supplied meds.

## 2023-05-13 NOTE — Evaluation (Signed)
Physical Therapy Evaluation Patient Details Name: [name] MRN: [id number] DOB: 1946/01/05 Today's Date: 05/13/2023  History of Present Illness  [name] is a 77 y.o. female presented to ED with altered mental status and after sustained fall. Found to have acute metabolic encephalopathy--COVID positive. PHMx: Alzheimer disease with dementia (baseline oriented to person only), generalized anxiety disorder, hearing loss, insomnia, and unsteady gait, sensorineural deafness.  Clinical Impression   Pt admitted secondary to problem above with deficits below. Husband and daughter present during session. PTA patient was independent with her mobility and attended a day program 4 days/week. Pt currently requires +2 total assist for bed mobility and +2 min assist for ambulation. Lengthy discussion with family with goal to return home with husband looking into hiring a sitter (especially for nights). Anticipate patient will benefit from PT to address problems listed below. Will continue to follow acutely to maximize functional mobility independence and safety.           If plan is discharge home, recommend the following: A little help with walking and/or transfers;A little help with bathing/dressing/bathroom;Assistance with feeding;Direct supervision/assist for medications management;Direct supervision/assist for financial management;Help with stairs or ramp for entrance;Supervision due to cognitive status   Can travel by private vehicle        Equipment Recommendations None recommended by PT  Recommendations for Other Services       Functional Status Assessment Patient has had a recent decline in their functional status and demonstrates the ability to make significant improvements in function in a reasonable and predictable amount of time.     Precautions / Restrictions Precautions Precautions: Fall      Mobility  Bed Mobility Overal bed mobility: Needs Assistance Bed Mobility:  Supine to Sit     Supine to sit: Total assist, +2 for physical assistance, HOB elevated     General bed mobility comments: incr assist due to decr cognition    Transfers Overall transfer level: Needs assistance Equipment used: 2 person hand held assist Transfers: Sit to/from Stand Sit to Stand: Min assist, Mod assist, +2 safety/equipment, +2 physical assistance           General transfer comment: stood from EOB and from recliner; progressed from min assist to mod assist as fatigued    Ambulation/Gait Ambulation/Gait assistance: Min assist, +2 physical assistance Gait Distance (Feet): 45 Feet Assistive device: 2 person hand held assist Gait Pattern/deviations: Step-through pattern, Decreased stride length (slight posterior lean)   Gait velocity interpretation: <1.8 ft/sec, indicate of risk for recurrent falls   General Gait Details: pt required guiding assist and occasional balance assist due to posterior lean  Stairs            Wheelchair Mobility     Tilt Bed    Modified Rankin (Stroke Patients Only)       Balance Overall balance assessment: Needs assistance Sitting-balance support: No upper extremity supported, Feet supported Sitting balance-Leahy Scale: Poor Sitting balance - Comments: occasional rt lean   Standing balance support: Single extremity supported Standing balance-Leahy Scale: Poor Standing balance comment: tends to return to sitting quickly                             Pertinent Vitals/Pain Pain Assessment Pain Assessment: PAINAD Breathing: normal Negative Vocalization: none Facial Expression: smiling or inexpressive Body Language: relaxed Consolability: no need to console PAINAD Score: 0    Home Living Family/patient expects to be discharged to:: Private  residence Living Arrangements: Spouse/significant other Available Help at Discharge: Family;Available 24 hours/day (husband only; daughter lives out-of-town and may  stay with them on initial discharge)             Home Equipment: Grab bars - tub/shower;Shower seat      Prior Function Prior Level of Function : History of Falls (last six months);Needs assist             Mobility Comments: walking with no device       Extremity/Trunk Assessment   Upper Extremity Assessment Upper Extremity Assessment: Defer to OT evaluation    Lower Extremity Assessment Lower Extremity Assessment: Generalized weakness    Cervical / Trunk Assessment Cervical / Trunk Assessment: Normal  Communication   Communication Communication: Hearing impairment;Difficulty communicating thoughts/reduced clarity of speech;Difficulty following commands/understanding Following commands: Follows one step commands inconsistently Cueing Techniques: Verbal cues;Gestural cues;Tactile cues  Cognition Arousal: Alert Behavior During Therapy: Restless (fidgets with items in reach) Overall Cognitive Status: History of cognitive impairments - at baseline (per family)                                 General Comments: able to follow simple commands with multi-modal cues        General Comments General comments (skin integrity, edema, etc.): Husband and daughter present. Discussed with them that pt will now need 24/7 supervision/eyes on her due to incr fall risk. Discussed option of rehab, but would need to provide 24/7 sitter and pt will likely do better at home with 24/7 supervision. Discussed husband hiring someone for nights.    Exercises     Assessment/Plan    PT Assessment Patient needs continued PT services  PT Problem List Decreased strength;Decreased activity tolerance;Decreased balance;Decreased mobility;Decreased cognition;Decreased knowledge of use of DME;Decreased safety awareness       PT Treatment Interventions DME instruction;Gait training;Stair training;Functional mobility training;Therapeutic activities;Therapeutic exercise;Balance  training;Patient/family education    PT Goals (Current goals can be found in the Care Plan section)  Acute Rehab PT Goals Patient Stated Goal: unable to state; family wants her to return home if able to manage PT Goal Formulation: With family Time For Goal Achievement: 05/27/23 Potential to Achieve Goals: Fair    Frequency Min 1X/week     Co-evaluation               AM-PAC PT "6 Clicks" Mobility  Outcome Measure Help needed turning from your back to your side while in a flat bed without using bedrails?: A Lot Help needed moving from lying on your back to sitting on the side of a flat bed without using bedrails?: Total Help needed moving to and from a bed to a chair (including a wheelchair)?: Total Help needed standing up from a chair using your arms (e.g., wheelchair or bedside chair)?: Total Help needed to walk in hospital room?: Total Help needed climbing 3-5 steps with a railing? : Total 6 Click Score: 7    End of Session Equipment Utilized During Treatment: Gait belt Activity Tolerance: Patient limited by fatigue Patient left: in chair;with call bell/phone within reach;with chair alarm set;with family/visitor present Nurse Communication: Mobility status PT Visit Diagnosis: Unsteadiness on feet (R26.81);Muscle weakness (generalized) (M62.81)    Time: 1610-9604 PT Time Calculation (min) (ACUTE ONLY): 55 min   Charges:   PT Evaluation $PT Eval Low Complexity: 1 Low PT Treatments $Gait Training: 8-22 mins PT General Charges $$ ACUTE  PT VISIT: 1 Visit          Jerolyn Center, PT Acute Rehabilitation Services  Office [phone]   Zena Amos 05/13/2023, 12:21 PM

## 2023-05-13 NOTE — Plan of Care (Signed)

## 2023-05-13 NOTE — Evaluation (Signed)
Occupational Therapy Evaluation Patient Details Name: [name] MRN: [id number] DOB: 05-May-1946 Today's Date: 05/13/2023   History of Present Illness [name] is a 77 y.o. female presented to ED with altered mental status and after sustained fall. Found to have acute metabolic encephalopathy--COVID positive. PHMx: Alzheimer disease with dementia (baseline oriented to person only), generalized anxiety disorder, hearing loss, insomnia, and unsteady gait, sensorineural deafness.   Clinical Impression   This 77 yo female admitted with above presents to acute OT with PLOF of being able to ambulate on her own without AD and feed herself; otherwise she needed A for all ADLs. She currently is not feeding herself and needs min A to ambulate with 2 person HHA. She will continue to benefit from acute OT with follow up HHOT recommended.       If plan is discharge home, recommend the following: A little help with walking and/or transfers;A lot of help with bathing/dressing/bathroom;Assistance with cooking/housework;Assistance with feeding;Help with stairs or ramp for entrance;Assist for transportation;Direct supervision/assist for financial management;Direct supervision/assist for medications management       Equipment Recommendations  None recommended by OT       Precautions / Restrictions Precautions Precautions: Fall Restrictions Weight Bearing Restrictions: No      Mobility Bed Mobility Overal bed mobility: Needs Assistance Bed Mobility: Supine to Sit     Supine to sit: Total assist, +2 for physical assistance, HOB elevated     General bed mobility comments: incr assist due to decr cognition    Transfers Overall transfer level: Needs assistance Equipment used: 2 person hand held assist   Sit to Stand: Min assist, Mod assist, +2 safety/equipment, +2 physical assistance           General transfer comment: stood from EOB and from recliner; progressed from min assist to  mod assist as fatigued      Balance Overall balance assessment: Needs assistance Sitting-balance support: No upper extremity supported, Feet supported Sitting balance-Leahy Scale: Poor Sitting balance - Comments: occasional rt lean   Standing balance support: Single extremity supported Standing balance-Leahy Scale: Poor Standing balance comment: tends to return to sitting quickly                           ADL either performed or assessed with clinical judgement   ADL                                         General ADL Comments: Per chart she needs A for all basic ADLs except can feed herself                  Pertinent Vitals/Pain Pain Assessment Pain Assessment: PAINAD Breathing: normal Negative Vocalization: none Facial Expression: smiling or inexpressive Body Language: relaxed Consolability: no need to console PAINAD Score: 0     Extremity/Trunk Assessment Upper Extremity Assessment Upper Extremity Assessment: Overall WFL for tasks assessed           Communication Communication Communication: Hearing impairment;Difficulty communicating thoughts/reduced clarity of speech;Difficulty following commands/understanding Following commands: Follows one step commands inconsistently;Follows one step commands with increased time Cueing Techniques: Gestural cues;Tactile cues;Verbal cues   Cognition Arousal: Alert Behavior During Therapy: Restless (fidgets with items within reach)  General Comments: able to follow simple commands with multi-modal cues     General Comments  Husband and daughter present. Discussed with them the pateint will now need 24/7 supervision/eyes on her to increased fall risk. Discussed option of rehab, but would need to provide 24/7 sitter and pt will likely do better at home with 24/7 supervision/prn A. Discussed perhaps husband hiring someone at night             Home Living Family/patient expects to be discharged to:: Private residence Living Arrangements: Spouse/significant other Available Help at Discharge: Family;Available 24 hours/day (husband, dtr in room lives out ot town) Type of Home: House                       Home Equipment: Grab bars - tub/shower;Shower seat   Additional Comments: Attends memory care day center 4 days a week      Prior Functioning/Environment Prior Level of Function : History of Falls (last six months);Needs assist             Mobility Comments: walking with no device          OT Problem List: Impaired balance (sitting and/or standing);Decreased cognition;Decreased safety awareness      OT Treatment/Interventions: Self-care/ADL training;Patient/family education;Balance training    OT Goals(Current goals can be found in the care plan section) Acute Rehab OT Goals Patient Stated Goal: family hopeful patient will progress where she can walk on her own again OT Goal Formulation: With family Time For Goal Achievement: 05/27/23 Potential to Achieve Goals: Fair  OT Frequency: Min 1X/week    Co-evaluation PT/OT/SLP Co-Evaluation/Treatment: Yes Reason for Co-Treatment: For patient/therapist safety;Necessary to address cognition/behavior during functional activity   OT goals addressed during session: ADL's and self-care;Strengthening/ROM      AM-PAC OT "6 Clicks" Daily Activity     Outcome Measure Help from another person eating meals?: A Lot Help from another person taking care of personal grooming?: Total Help from another person toileting, which includes using toliet, bedpan, or urinal?: Total Help from another person bathing (including washing, rinsing, drying)?: Total Help from another person to put on and taking off regular upper body clothing?: Total Help from another person to put on and taking off regular lower body clothing?: Total 6 Click Score: 7   End of Session Equipment  Utilized During Treatment: Gait belt Nurse Communication: Mobility status  Activity Tolerance: Patient tolerated treatment well Patient left: in chair;with call bell/phone within reach;with chair alarm set  OT Visit Diagnosis: Unsteadiness on feet (R26.81);Other abnormalities of gait and mobility (R26.89);Muscle weakness (generalized) (M62.81);Feeding difficulties (R63.3);Other symptoms and signs involving the nervous system (R29.898)                Time: 1005-1053 OT Time Calculation (min): 48 min Charges:  OT Evaluation $OT Eval Low Complexity: 1 Low  Lindon Romp OT Acute Rehabilitation Services Office [phone]    Evette Georges 05/13/2023, 9:57 PM

## 2023-05-13 NOTE — Progress Notes (Signed)
PROGRESS NOTE        PATIENT DETAILS Name: [name] Age: 77 y.o. Sex: female Date of Birth: Jan 02, 1946 Admit Date: 05/12/2023 Admitting Physician Tereasa Coop, MD [id number], Tawni Pummel, MD  Brief Summary: Patient is a 77 y.o.  female with history of advanced Alzheimer's dementia-presenting with fall/generalized weakness/unsteady gait-underwent extensive imaging workup that was negative-found to have COVID-19 infection (history of exposure at daycare)   Significant events: 10/28>> admit to [facility]  Significant studies: 10/28>> x-ray pelvis: No fracture 10/28>> CXR: No PNA 10/28>> CT C-spine: No fracture 10/28>> CT T-spine: No fracture 10/28>> CT head: No acute intracranial abnormality 10/28>> MRI brain: No acute CVA  Significant microbiology data: 10/28>> COVID PCR: Positive 10/28>> influenza/RSV PCR: Negative 10/28>> urine culture: Pending  Procedures: None  Consults: None  Subjective: Easily arousable-was sleeping when I walked in-pleasantly confused-per daughter her mentation is now at baseline and appears improved than yesterday.  Objective: Vitals: Blood pressure (!) 123/53, pulse 64, temperature 100.3 F (37.9 C), temperature source Axillary, resp. rate 18, height 5' (1.524 m), weight 54 kg, SpO2 98%.   Exam: Gen Exam:not in any distress HEENT:atraumatic, normocephalic Chest: B/L clear to auscultation anteriorly CVS:S1S2 regular Abdomen:soft non tender, non distended Extremities:no edema Neurology: Non focal Skin: no rash  Pertinent Labs/Radiology:    Latest Ref Rng & Units 05/13/2023    4:26 AM 05/12/2023    3:31 PM 05/12/2023    3:21 PM  CBC  WBC 4.0 - 10.5 K/uL 8.6   11.3   Hemoglobin 12.0 - 15.0 g/dL 78.2  95.6  21.3   Hematocrit 36.0 - 46.0 % 35.8  41.0  42.4   Platelets 150 - 400 K/uL 269   308     Lab Results  Component Value Date   NA 140 05/13/2023   K 3.9 05/13/2023   CL 107 05/13/2023   CO2 26  05/13/2023      Assessment/Plan: Generalized weakness/unsteady gait/worsening confusion Due to COVID-19 infection and related dehydration Improved after IV fluids-mentation now better and almost back to baseline per daughter at bedside Although she has COVID-19 infection-she does not have pneumonia-stable on room air-appears clinically improved with IV fluids-hold off on starting any antivirals at this point.  Discussed with daughter at bedside-she is agreeable with this plan.Plan is to serve for another 24 hours-off all IV fluids now- if she continues to stay stable-likely home tomorrow. Mobilize with PT/OT and see how she does.  Acute metabolic encephalopathy Suspect this was  due to COVID-19 infection/dehydration-better after IVF-per daughter at bedside-she is close to baseline. Delirium precautions  Lactic acidosis Mild This is due to dehydration and not from sepsis sepsis ruled out.  History of Alzheimer's dementia Appears very advanced-only oriented x 1 at baseline Namenda/Exelon patch Delirium precautions  Hard of hearing  Note-AKI ruled out-not present on admission.   BMI: Estimated body mass index is 23.25 kg/m as calculated from the following:   Height as of this encounter: 5' (1.524 m).   Weight as of this encounter: 54 kg.   Code status:   Code Status: Full Code   DVT Prophylaxis: SCDs Start: 05/12/23 2205 Place TED hose Start: 05/12/23 2205   Family Communication: None at bedside   Disposition Plan: Status is: Observation The patient will require care spanning > 2 midnights and should be moved to inpatient because: Severity of illness  Planned Discharge Destination:Home health   Diet: Diet Order             Diet Heart Room service appropriate? Yes; Fluid consistency: Thin  Diet effective now                     Antimicrobial agents: Anti-infectives (From admission, onward)    None        MEDICATIONS: Scheduled Meds:   memantine  10 mg Oral BID   rivastigmine  13.3 mg Transdermal Daily   Continuous Infusions: PRN Meds:.acetaminophen **OR** acetaminophen, ondansetron **OR** ondansetron (ZOFRAN) IV, senna-docusate   I have personally reviewed following labs and imaging studies  LABORATORY DATA: CBC: Recent Labs  Lab 05/12/23 1521 05/12/23 1531 05/13/23 0426  WBC 11.3*  --  8.6  NEUTROABS 9.3*  --   --   HGB 13.6 13.9 11.9*  HCT 42.4 41.0 35.8*  MCV 94.9  --  91.6  PLT 308  --  269    Basic Metabolic Panel: Recent Labs  Lab 05/12/23 1521 05/12/23 1531 05/13/23 0426  NA 137 138 140  K 4.6 4.8 3.9  CL 103  --  107  CO2 20*  --  26  GLUCOSE 133*  --  109*  BUN 23  --  15  CREATININE 1.01*  --  0.86  CALCIUM 8.7*  --  8.3*  MG 2.2  --   --     GFR: Estimated Creatinine Clearance: 38.1 mL/min (by C-G formula based on SCr of 0.86 mg/dL).  Liver Function Tests: Recent Labs  Lab 05/12/23 1521 05/13/23 0426  AST 70* 68*  ALT 23 24  ALKPHOS 63 53  BILITOT 0.6 0.6  PROT 7.1 5.8*  ALBUMIN 3.5 2.8*   No results for input(s): "LIPASE", "AMYLASE" in the last 168 hours. Recent Labs  Lab 05/13/23 0015  AMMONIA 12    Coagulation Profile: No results for input(s): "INR", "PROTIME" in the last 168 hours.  Cardiac Enzymes: No results for input(s): "CKTOTAL", "CKMB", "CKMBINDEX", "TROPONINI" in the last 168 hours.  BNP (last 3 results) No results for input(s): "PROBNP" in the last 8760 hours.  Lipid Profile: No results for input(s): "CHOL", "HDL", "LDLCALC", "TRIG", "CHOLHDL", "LDLDIRECT" in the last 72 hours.  Thyroid Function Tests: Recent Labs    05/13/23 0016  TSH 4.946*    Anemia Panel: Recent Labs    05/13/23 0015  VITAMINB12 6,948*    Urine analysis:    Component Value Date/Time   COLORURINE YELLOW 05/12/2023 1508   APPEARANCEUR CLEAR 05/12/2023 1508   LABSPEC 1.026 05/12/2023 1508   PHURINE 5.0 05/12/2023 1508   GLUCOSEU NEGATIVE 05/12/2023 1508   HGBUR  NEGATIVE 05/12/2023 1508   BILIRUBINUR NEGATIVE 05/12/2023 1508   KETONESUR NEGATIVE 05/12/2023 1508   PROTEINUR NEGATIVE 05/12/2023 1508   UROBILINOGEN 0.2 12/28/2013 1134   NITRITE NEGATIVE 05/12/2023 1508   LEUKOCYTESUR NEGATIVE 05/12/2023 1508    Sepsis Labs: Lactic Acid, Venous    Component Value Date/Time   LATICACIDVEN 1.4 05/12/2023 1708    MICROBIOLOGY: Recent Results (from the past 240 hour(s))  Resp panel by RT-PCR (RSV, Flu A&B, Covid) Anterior Nasal Swab     Status: Abnormal   Collection Time: 05/12/23 10:20 PM   Specimen: Anterior Nasal Swab  Result Value Ref Range Status   SARS Coronavirus 2 by RT PCR POSITIVE (A) NEGATIVE Final   Influenza A by PCR NEGATIVE NEGATIVE Final   Influenza B by PCR NEGATIVE NEGATIVE Final  Comment: (NOTE) The Xpert Xpress SARS-CoV-2/FLU/RSV plus assay is intended as an aid in the diagnosis of influenza from Nasopharyngeal swab specimens and should not be used as a sole basis for treatment. Nasal washings and aspirates are unacceptable for Xpert Xpress SARS-CoV-2/FLU/RSV testing.  Fact Sheet for Patients: BloggerCourse.com  Fact Sheet for Healthcare Providers: SeriousBroker.it  This test is not yet approved or cleared by the [place] FDA and has been authorized for detection and/or diagnosis of SARS-CoV-2 by FDA under an Emergency Use Authorization (EUA). This EUA will remain in effect (meaning this test can be used) for the duration of the COVID-19 declaration under Section 564(b)(1) of the Act, 21 U.S.C. section 360bbb-3(b)(1), unless the authorization is terminated or revoked.     Resp Syncytial Virus by PCR NEGATIVE NEGATIVE Final    Comment: (NOTE) Fact Sheet for Patients: BloggerCourse.com  Fact Sheet for Healthcare Providers: SeriousBroker.it  This test is not yet approved or cleared by the [place] FDA  and has been authorized for detection and/or diagnosis of SARS-CoV-2 by FDA under an Emergency Use Authorization (EUA). This EUA will remain in effect (meaning this test can be used) for the duration of the COVID-19 declaration under Section 564(b)(1) of the Act, 21 U.S.C. section 360bbb-3(b)(1), unless the authorization is terminated or revoked.  Performed at [facility], [address], [place], [place] [address]     RADIOLOGY STUDIES/RESULTS: MR BRAIN WO CONTRAST  Result Date: 05/12/2023 CLINICAL DATA:  Altered mental status and recent fall EXAM: MRI HEAD WITHOUT CONTRAST TECHNIQUE: Multiplanar, multiecho pulse sequences of the brain and surrounding structures were obtained without intravenous contrast. COMPARISON:  01/31/2017 FINDINGS: Brain: No acute infarct, mass effect or extra-axial collection. No acute or chronic hemorrhage. There is multifocal hyperintense T2-weighted signal within the white matter. Generalized volume loss, progressed since 2018. The midline structures are normal. Vascular: Normal flow voids. Skull and upper cervical spine: Normal calvarium and skull base. Visualized upper cervical spine and soft tissues are normal. Sinuses/Orbits:No paranasal sinus fluid levels or advanced mucosal thickening. No mastoid or middle ear effusion. Normal orbits. IMPRESSION: 1. No acute intracranial abnormality. 2. Generalized volume loss and findings of chronic small vessel ischemia. Electronically Signed   By: Deatra Robinson M.D.   On: 05/12/2023 21:06   CT Thoracic Spine Wo Contrast  Result Date: 05/12/2023 CLINICAL DATA:  Fall EXAM: CT THORACIC SPINE WITHOUT CONTRAST TECHNIQUE: Multidetector CT images of the thoracic were obtained using the standard protocol without intravenous contrast. RADIATION DOSE REDUCTION: This exam was performed according to the departmental dose-optimization program which includes automated exposure control, adjustment of the mA and/or kV according to  patient size and/or use of iterative reconstruction technique. COMPARISON:  None Available. FINDINGS: Alignment: Normal. Vertebrae: No acute fracture or focal pathologic process. Paraspinal and other soft tissues: Negative. Disc levels: No spinal canal stenosis IMPRESSION: No acute fracture or static subluxation of the thoracic spine. Electronically Signed   By: Deatra Robinson M.D.   On: 05/12/2023 21:00   CT Head Wo Contrast  Result Date: 05/12/2023 CLINICAL DATA:  Delirium EXAM: CT HEAD WITHOUT CONTRAST CT CERVICAL SPINE WITHOUT CONTRAST TECHNIQUE: Multidetector CT imaging of the head and cervical spine was performed following the standard protocol without intravenous contrast. Multiplanar CT image reconstructions of the cervical spine were also generated. RADIATION DOSE REDUCTION: This exam was performed according to the departmental dose-optimization program which includes automated exposure control, adjustment of the mA and/or kV according to patient size and/or use of  iterative reconstruction technique. COMPARISON:  None Available. FINDINGS: CT HEAD FINDINGS Brain: There is no mass, hemorrhage or extra-axial collection. There is generalized atrophy without lobar predilection. There is hypoattenuation of the periventricular white matter, most commonly indicating chronic ischemic microangiopathy. Vascular: No abnormal hyperdensity of the major intracranial arteries or dural venous sinuses. No intracranial atherosclerosis. Skull: The visualized skull base, calvarium and extracranial soft tissues are normal. Sinuses/Orbits: No fluid levels or advanced mucosal thickening of the visualized paranasal sinuses. No mastoid or middle ear effusion. The orbits are normal. CT CERVICAL SPINE FINDINGS Alignment: Grade 1 anterolisthesis at C3-4 and C4-5 Skull base and vertebrae: No acute fracture. Soft tissues and spinal canal: No prevertebral fluid or swelling. No visible canal hematoma. Disc levels: Multilevel facet  arthrosis and disc space narrowing. No high-grade spinal canal stenosis. Upper chest: No pneumothorax, pulmonary nodule or pleural effusion. Other: Normal visualized paraspinal cervical soft tissues. IMPRESSION: 1. No acute intracranial abnormality. 2. Chronic ischemic microangiopathy and generalized atrophy. 3. No acute fracture or static subluxation of the cervical spine. Electronically Signed   By: Deatra Robinson M.D.   On: 05/12/2023 20:58   CT Cervical Spine Wo Contrast  Result Date: 05/12/2023 CLINICAL DATA:  Delirium EXAM: CT HEAD WITHOUT CONTRAST CT CERVICAL SPINE WITHOUT CONTRAST TECHNIQUE: Multidetector CT imaging of the head and cervical spine was performed following the standard protocol without intravenous contrast. Multiplanar CT image reconstructions of the cervical spine were also generated. RADIATION DOSE REDUCTION: This exam was performed according to the departmental dose-optimization program which includes automated exposure control, adjustment of the mA and/or kV according to patient size and/or use of iterative reconstruction technique. COMPARISON:  None Available. FINDINGS: CT HEAD FINDINGS Brain: There is no mass, hemorrhage or extra-axial collection. There is generalized atrophy without lobar predilection. There is hypoattenuation of the periventricular white matter, most commonly indicating chronic ischemic microangiopathy. Vascular: No abnormal hyperdensity of the major intracranial arteries or dural venous sinuses. No intracranial atherosclerosis. Skull: The visualized skull base, calvarium and extracranial soft tissues are normal. Sinuses/Orbits: No fluid levels or advanced mucosal thickening of the visualized paranasal sinuses. No mastoid or middle ear effusion. The orbits are normal. CT CERVICAL SPINE FINDINGS Alignment: Grade 1 anterolisthesis at C3-4 and C4-5 Skull base and vertebrae: No acute fracture. Soft tissues and spinal canal: No prevertebral fluid or swelling. No visible  canal hematoma. Disc levels: Multilevel facet arthrosis and disc space narrowing. No high-grade spinal canal stenosis. Upper chest: No pneumothorax, pulmonary nodule or pleural effusion. Other: Normal visualized paraspinal cervical soft tissues. IMPRESSION: 1. No acute intracranial abnormality. 2. Chronic ischemic microangiopathy and generalized atrophy. 3. No acute fracture or static subluxation of the cervical spine. Electronically Signed   By: Deatra Robinson M.D.   On: 05/12/2023 20:58   DG Pelvis Portable  Result Date: 05/12/2023 CLINICAL DATA:  Fall EXAM: PORTABLE PELVIS 1-2 VIEWS COMPARISON:  08/28/2006 FINDINGS: There is no evidence of pelvic fracture or diastasis. No pelvic bone lesions are seen. Prior lower lumbar fusion. IMPRESSION: Negative. Electronically Signed   By: Duanne Guess D.O.   On: 05/12/2023 19:06   DG CHEST PORT 1 VIEW  Result Date: 05/12/2023 CLINICAL DATA:  Cough EXAM: PORTABLE CHEST 1 VIEW COMPARISON:  07/15/2022 FINDINGS: The heart size and mediastinal contours are within normal limits. Both lungs are clear. The visualized skeletal structures are unremarkable. IMPRESSION: No active disease. Electronically Signed   By: Jasmine Pang M.D.   On: 05/12/2023 19:01     LOS: 0 days  Jeoffrey Massed, MD  [facility]    To contact the attending provider between 7A-7P or the covering provider during after hours 7P-7A, please log into the web site [email or web address] and access using universal [place] password for that web site. If you do not have the password, please call the hospital operator.  05/13/2023, 9:04 AM

## 2023-05-14 DIAGNOSIS — N179 Acute kidney failure, unspecified: Secondary | ICD-10-CM | POA: Diagnosis not present

## 2023-05-14 DIAGNOSIS — R296 Repeated falls: Secondary | ICD-10-CM | POA: Diagnosis not present

## 2023-05-14 DIAGNOSIS — G309 Alzheimer's disease, unspecified: Secondary | ICD-10-CM | POA: Diagnosis not present

## 2023-05-14 DIAGNOSIS — G9341 Metabolic encephalopathy: Secondary | ICD-10-CM | POA: Diagnosis not present

## 2023-05-14 NOTE — TOC Transition Note (Signed)
Transition of Care [facility]) - CM/SW Discharge Note   Patient Details  Name: [name] MRN: [id number] Date of Birth: 13-Jun-1946  Transition of Care [facility]) CM/SW Contact:  Gordy Clement, RN Phone Number: 05/14/2023, 9:20 AM   Clinical Narrative:     Patient will DC to home today with Home Health  Frances Furbish will provide PT and OT. Wheelchair has been ordered per Husbands request. [employer] will deliver to home address. Husband to transport.  No additional TOC needs            Patient Goals and CMS Choice      Discharge Placement                         Discharge Plan and Services Additional resources added to the After Visit Summary for                                       Social Determinants of Health (SDOH) Interventions SDOH Screenings   Tobacco Use: Low Risk  (05/12/2023)     Readmission Risk Interventions     No data to display

## 2023-05-14 NOTE — Progress Notes (Signed)
Physical Therapy Treatment Patient Details Name: [name] MRN: [id number] DOB: [birth year] Today's Date: 05/14/2023   History of Present Illness [name] is a 77 y.o. female presented to ED with altered mental status and after sustained fall. Found to have acute metabolic encephalopathy--COVID positive. PHMx: Alzheimer disease with dementia (baseline oriented to person only), generalized anxiety disorder, hearing loss, insomnia, and unsteady gait, sensorineural deafness.    PT Comments  Husband present. Issued gait belt and adjusted to fit patient. Patient instructed in use and how to assist pt with gait x 20 ft x2 reps (seated rest). See below for details. States he plans to hire caregivers to assist (especially overnight) and has ordered chair and bed alarms from [place].     If plan is discharge home, recommend the following: A little help with walking and/or transfers;A little help with bathing/dressing/bathroom;Assistance with feeding;Direct supervision/assist for medications management;Direct supervision/assist for financial management;Help with stairs or ramp for entrance;Supervision due to cognitive status   Can travel by private vehicle        Equipment Recommendations  None recommended by PT    Recommendations for Other Services       Precautions / Restrictions Precautions Precautions: Fall Restrictions Weight Bearing Restrictions: No     Mobility  Bed Mobility Overal bed mobility: Needs Assistance Bed Mobility: Supine to Sit, Sit to Supine     Supine to sit: HOB elevated, Min assist Sit to supine: Min assist   General bed mobility comments: assist to initiate movements and then pt engages    Transfers Overall transfer level: Needs assistance Equipment used: 1 person hand held assist (face to face with gait belt) Transfers: Sit to/from Stand Sit to Stand: Min assist           General transfer comment: stood x 3 from EOB (once with husband  assisting)    Ambulation/Gait Ambulation/Gait assistance: Min assist Gait Distance (Feet): 15 Feet (wiht PT; 20 x 2 with husband assist) Assistive device: 1 person hand held assist Gait Pattern/deviations: Step-through pattern, Decreased stride length (slight posterior lean)   Gait velocity interpretation: <1.8 ft/sec, indicate of risk for recurrent falls   General Gait Details: using gait belt, PT or husband walking backwards in front of pt holding belt and pt holding their UEs   Stairs             Wheelchair Mobility     Tilt Bed    Modified Rankin (Stroke Patients Only)       Balance Overall balance assessment: Needs assistance Sitting-balance support: No upper extremity supported, Feet supported Sitting balance-Leahy Scale: Fair     Standing balance support: Single extremity supported Standing balance-Leahy Scale: Poor Standing balance comment: slight posterior lean (?due to PT in front of her?)                            Cognition Arousal: Alert Behavior During Therapy: Restless (fidgets with items within reach) Overall Cognitive Status: History of cognitive impairments - at baseline (per family)                                 General Comments: able to follow simple commands with multi-modal cues        Exercises      General Comments General comments (skin integrity, edema, etc.): Husband asking for additional resources for sitters for home. Messaged Case Manager  and she responded she will come by to see husband.      Pertinent Vitals/Pain Pain Assessment Pain Assessment: Faces Faces Pain Scale: No hurt    Home Living                          Prior Function            PT Goals (current goals can now be found in the care plan section) Acute Rehab PT Goals Patient Stated Goal: unable to state; family wants her to return home if able to manage Time For Goal Achievement: 05/27/23 Potential to Achieve  Goals: Fair Progress towards PT goals: Progressing toward goals    Frequency    Min 1X/week      PT Plan      Co-evaluation              AM-PAC PT "6 Clicks" Mobility   Outcome Measure  Help needed turning from your back to your side while in a flat bed without using bedrails?: A Little Help needed moving from lying on your back to sitting on the side of a flat bed without using bedrails?: A Little Help needed moving to and from a bed to a chair (including a wheelchair)?: A Little Help needed standing up from a chair using your arms (e.g., wheelchair or bedside chair)?: A Little Help needed to walk in hospital room?: A Little Help needed climbing 3-5 steps with a railing? : A Little 6 Click Score: 18    End of Session Equipment Utilized During Treatment: Gait belt Activity Tolerance: Patient tolerated treatment well Patient left: with call bell/phone within reach;with family/visitor present;in bed;with bed alarm set   PT Visit Diagnosis: Unsteadiness on feet (R26.81);Muscle weakness (generalized) (M62.81)     Time: 1610-9604 PT Time Calculation (min) (ACUTE ONLY): 17 min  Charges:    $Gait Training: 8-22 mins PT General Charges $$ ACUTE PT VISIT: 1 Visit                      Jerolyn Center, PT Acute Rehabilitation Services  Office [phone]    Zena Amos 05/14/2023, 11:44 AM

## 2023-05-14 NOTE — Discharge Summary (Signed)
PATIENT DETAILS Name: [name] Age: 77 y.o. Sex: female Date of Birth: May 30, 1946 MRN: [id number]. Admitting Physician: Maretta Bees, MD NGE:[id number], Tawni Pummel, MD  Admit Date: 05/12/2023 Discharge date: 05/14/2023  Recommendations for Outpatient Follow-up:  Follow up with PCP in 1-2 weeks Please obtain CMP/CBC in one week   Admitted From:  Home  Disposition: Home health   Discharge Condition: good  CODE STATUS:   Code Status: Full Code   Diet recommendation:  Diet Order             Diet general           Diet Heart Room service appropriate? Yes; Fluid consistency: Thin  Diet effective now                    Brief Summary: Patient is a 77 y.o.  female with history of advanced Alzheimer's dementia-presenting with fall/generalized weakness/unsteady gait-underwent extensive imaging workup that was negative-found to have COVID-19 infection (history of exposure at daycare)    Significant events: 10/28>> admit to [facility]   Significant studies: 10/28>> x-ray pelvis: No fracture 10/28>> CXR: No PNA 10/28>> CT C-spine: No fracture 10/28>> CT T-spine: No fracture 10/28>> CT head: No acute intracranial abnormality 10/28>> MRI brain: No acute CVA   Significant microbiology data: 10/28>> COVID PCR: Positive 10/28>> influenza/RSV PCR: Negative 10/28>> urine culture:negative   Procedures: None   Consults: None  Brief Hospital Course: Generalized weakness/unsteady gait/worsening confusion Due to COVID-19 infection and related dehydration Significantly better after IV fluids-mentation is better-she is almost back to baseline per daughter on 10/29 and spouse on 10/30 She remains on room air-really does not have a lot of cough or URI symptoms.  No indication that she has any severity in terms of her COVID-19 infection She was just managed with supportive care-plan is to discharge home today-discussed with spouse again on 10/30-if in the unlikely event she  develops a worsening-fever/cough/shortness of breath-he needs to seek immediate medical attention.   Acute metabolic encephalopathy Suspect this was  due to COVID-19 infection/dehydration-better after IVF-per family she is now back to baseline.   Lactic acidosis Mild This is due to dehydration and not from sepsis sepsis ruled out.   History of Alzheimer's dementia Appears very advanced-only oriented x 1 at baseline Namenda/Exelon patch Delirium precautions Lives with spouse who provides 24/7 care.   Hard of hearing      BMI: Estimated body mass index is 23.25 kg/m as calculated from the following:   Height as of this encounter: 5' (1.524 m).   Weight as of this encounter: 54 kg.    Discharge Diagnoses:  Principal Problem:   Acute metabolic encephalopathy Active Problems:   AKI (acute kidney injury) (HCC)   Recurrent falls   Dementia without behavioral disturbance (HCC)   Generalized anxiety disorder   Alzheimer disease (HCC)   History of unsteady gait   Sensorineural deafness   Lactic acidosis   Discharge Instructions:  Activity:  As tolerated   Discharge Instructions     Call MD for:  difficulty breathing, headache or visual disturbances   Complete by: As directed    Call MD for:  extreme fatigue   Complete by: As directed    Call MD for:  persistant dizziness or light-headedness   Complete by: As directed    Call MD for:  temperature >100.4   Complete by: As directed    Diet general   Complete by: As directed    Discharge instructions  Complete by: As directed    Follow with Primary MD  Noberto Retort, MD in 1-2 weeks  Please get a complete blood count and chemistry panel checked by your Primary MD at your next visit, and again as instructed by your Primary MD.  Get Medicines reviewed and adjusted: Please take all your medications with you for your next visit with your Primary MD  Laboratory/radiological data: Please request your Primary MD to  go over all hospital tests and procedure/radiological results at the follow up, please ask your Primary MD to get all Hospital records sent to his/her office.  In some cases, they will be blood work, cultures and biopsy results pending at the time of your discharge. Please request that your primary care M.D. follows up on these results.  Also Note the following: If you experience worsening of your admission symptoms, develop shortness of breath, life threatening emergency, suicidal or homicidal thoughts you must seek medical attention immediately by calling 911 or calling your MD immediately  if symptoms less severe.  You must read complete instructions/literature along with all the possible adverse reactions/side effects for all the Medicines you take and that have been prescribed to you. Take any new Medicines after you have completely understood and accpet all the possible adverse reactions/side effects.   Do not drive when taking Pain medications or sleeping medications (Benzodaizepines)  Do not take more than prescribed Pain, Sleep and Anxiety Medications. It is not advisable to combine anxiety,sleep and pain medications without talking with your primary care practitioner  Special Instructions: If you have smoked or chewed Tobacco  in the last 2 yrs please stop smoking, stop any regular Alcohol  and or any Recreational drug use.  Wear Seat belts while driving.  Please note: You were cared for by a hospitalist during your hospital stay. Once you are discharged, your primary care physician will handle any further medical issues. Please note that NO REFILLS for any discharge medications will be authorized once you are discharged, as it is imperative that you return to your primary care physician (or establish a relationship with a primary care physician if you do not have one) for your post hospital discharge needs so that they can reassess your need for medications and monitor your lab values.    Increase activity slowly   Complete by: As directed       Allergies as of 05/14/2023       Reactions   Aricept [donepezil] Diarrhea   Sulfa Antibiotics Hives   Wellbutrin [bupropion] Other (See Comments)   Body aches        Medication List     TAKE these medications    GINKOBA PO Take 1 capsule by mouth daily.   memantine 10 MG tablet Commonly known as: NAMENDA Take 1 tablet (10 mg total) by mouth 2 (two) times daily.   rivastigmine 13.3 MG/24HR Commonly known as: EXELON Place 1 patch (13.3 mg total) onto the skin daily.   VITAMIN B-12 PO Take 1 tablet by mouth daily.   VITAMIN C PO Take by mouth daily.   VITAMIN D-3 PO Take 1 capsule by mouth daily.        Follow-up Information     Noberto Retort, MD. Schedule an appointment as soon as possible for a visit in 1 week(s).   Specialty: Family Medicine Contact information: [address] [place] [address] [phone]  Allergies  Allergen Reactions   Aricept [Donepezil] Diarrhea   Sulfa Antibiotics Hives   Wellbutrin [Bupropion] Other (See Comments)    Body aches      Other Procedures/Studies: MR BRAIN WO CONTRAST  Result Date: 05/12/2023 CLINICAL DATA:  Altered mental status and recent fall EXAM: MRI HEAD WITHOUT CONTRAST TECHNIQUE: Multiplanar, multiecho pulse sequences of the brain and surrounding structures were obtained without intravenous contrast. COMPARISON:  01/31/2017 FINDINGS: Brain: No acute infarct, mass effect or extra-axial collection. No acute or chronic hemorrhage. There is multifocal hyperintense T2-weighted signal within the white matter. Generalized volume loss, progressed since 2018. The midline structures are normal. Vascular: Normal flow voids. Skull and upper cervical spine: Normal calvarium and skull base. Visualized upper cervical spine and soft tissues are normal. Sinuses/Orbits:No paranasal sinus fluid levels or advanced mucosal  thickening. No mastoid or middle ear effusion. Normal orbits. IMPRESSION: 1. No acute intracranial abnormality. 2. Generalized volume loss and findings of chronic small vessel ischemia. Electronically Signed   By: Deatra Robinson M.D.   On: 05/12/2023 21:06   CT Thoracic Spine Wo Contrast  Result Date: 05/12/2023 CLINICAL DATA:  Fall EXAM: CT THORACIC SPINE WITHOUT CONTRAST TECHNIQUE: Multidetector CT images of the thoracic were obtained using the standard protocol without intravenous contrast. RADIATION DOSE REDUCTION: This exam was performed according to the departmental dose-optimization program which includes automated exposure control, adjustment of the mA and/or kV according to patient size and/or use of iterative reconstruction technique. COMPARISON:  None Available. FINDINGS: Alignment: Normal. Vertebrae: No acute fracture or focal pathologic process. Paraspinal and other soft tissues: Negative. Disc levels: No spinal canal stenosis IMPRESSION: No acute fracture or static subluxation of the thoracic spine. Electronically Signed   By: Deatra Robinson M.D.   On: 05/12/2023 21:00   CT Head Wo Contrast  Result Date: 05/12/2023 CLINICAL DATA:  Delirium EXAM: CT HEAD WITHOUT CONTRAST CT CERVICAL SPINE WITHOUT CONTRAST TECHNIQUE: Multidetector CT imaging of the head and cervical spine was performed following the standard protocol without intravenous contrast. Multiplanar CT image reconstructions of the cervical spine were also generated. RADIATION DOSE REDUCTION: This exam was performed according to the departmental dose-optimization program which includes automated exposure control, adjustment of the mA and/or kV according to patient size and/or use of iterative reconstruction technique. COMPARISON:  None Available. FINDINGS: CT HEAD FINDINGS Brain: There is no mass, hemorrhage or extra-axial collection. There is generalized atrophy without lobar predilection. There is hypoattenuation of the periventricular  white matter, most commonly indicating chronic ischemic microangiopathy. Vascular: No abnormal hyperdensity of the major intracranial arteries or dural venous sinuses. No intracranial atherosclerosis. Skull: The visualized skull base, calvarium and extracranial soft tissues are normal. Sinuses/Orbits: No fluid levels or advanced mucosal thickening of the visualized paranasal sinuses. No mastoid or middle ear effusion. The orbits are normal. CT CERVICAL SPINE FINDINGS Alignment: Grade 1 anterolisthesis at C3-4 and C4-5 Skull base and vertebrae: No acute fracture. Soft tissues and spinal canal: No prevertebral fluid or swelling. No visible canal hematoma. Disc levels: Multilevel facet arthrosis and disc space narrowing. No high-grade spinal canal stenosis. Upper chest: No pneumothorax, pulmonary nodule or pleural effusion. Other: Normal visualized paraspinal cervical soft tissues. IMPRESSION: 1. No acute intracranial abnormality. 2. Chronic ischemic microangiopathy and generalized atrophy. 3. No acute fracture or static subluxation of the cervical spine. Electronically Signed   By: Deatra Robinson M.D.   On: 05/12/2023 20:58   CT Cervical Spine Wo Contrast  Result Date: 05/12/2023 CLINICAL DATA:  Delirium EXAM:  CT HEAD WITHOUT CONTRAST CT CERVICAL SPINE WITHOUT CONTRAST TECHNIQUE: Multidetector CT imaging of the head and cervical spine was performed following the standard protocol without intravenous contrast. Multiplanar CT image reconstructions of the cervical spine were also generated. RADIATION DOSE REDUCTION: This exam was performed according to the departmental dose-optimization program which includes automated exposure control, adjustment of the mA and/or kV according to patient size and/or use of iterative reconstruction technique. COMPARISON:  None Available. FINDINGS: CT HEAD FINDINGS Brain: There is no mass, hemorrhage or extra-axial collection. There is generalized atrophy without lobar predilection.  There is hypoattenuation of the periventricular white matter, most commonly indicating chronic ischemic microangiopathy. Vascular: No abnormal hyperdensity of the major intracranial arteries or dural venous sinuses. No intracranial atherosclerosis. Skull: The visualized skull base, calvarium and extracranial soft tissues are normal. Sinuses/Orbits: No fluid levels or advanced mucosal thickening of the visualized paranasal sinuses. No mastoid or middle ear effusion. The orbits are normal. CT CERVICAL SPINE FINDINGS Alignment: Grade 1 anterolisthesis at C3-4 and C4-5 Skull base and vertebrae: No acute fracture. Soft tissues and spinal canal: No prevertebral fluid or swelling. No visible canal hematoma. Disc levels: Multilevel facet arthrosis and disc space narrowing. No high-grade spinal canal stenosis. Upper chest: No pneumothorax, pulmonary nodule or pleural effusion. Other: Normal visualized paraspinal cervical soft tissues. IMPRESSION: 1. No acute intracranial abnormality. 2. Chronic ischemic microangiopathy and generalized atrophy. 3. No acute fracture or static subluxation of the cervical spine. Electronically Signed   By: Deatra Robinson M.D.   On: 05/12/2023 20:58   DG Pelvis Portable  Result Date: 05/12/2023 CLINICAL DATA:  Fall EXAM: PORTABLE PELVIS 1-2 VIEWS COMPARISON:  08/28/2006 FINDINGS: There is no evidence of pelvic fracture or diastasis. No pelvic bone lesions are seen. Prior lower lumbar fusion. IMPRESSION: Negative. Electronically Signed   By: Duanne Guess D.O.   On: 05/12/2023 19:06   DG CHEST PORT 1 VIEW  Result Date: 05/12/2023 CLINICAL DATA:  Cough EXAM: PORTABLE CHEST 1 VIEW COMPARISON:  07/15/2022 FINDINGS: The heart size and mediastinal contours are within normal limits. Both lungs are clear. The visualized skeletal structures are unremarkable. IMPRESSION: No active disease. Electronically Signed   By: Jasmine Pang M.D.   On: 05/12/2023 19:01     TODAY-DAY OF  DISCHARGE:  Subjective:   [name] today has no headache,no chest abdominal pain,no new weakness tingling or numbness, feels much better wants to go home today.   Objective:   Blood pressure (!) 161/143, pulse 64, temperature 97.7 F (36.5 C), temperature source Oral, resp. rate (!) 25, height 5' (1.524 m), weight 54 kg, SpO2 96%.  Intake/Output Summary (Last 24 hours) at 05/14/2023 0848 Last data filed at 05/14/2023 0600 Gross per 24 hour  Intake 240 ml  Output 1100 ml  Net -860 ml   Filed Weights   05/12/23 1438  Weight: 54 kg    Exam: Awake Alert, Oriented *3, No new F.N deficits, Normal affect [place].AT,PERRAL Supple Neck,No JVD, No cervical lymphadenopathy appriciated.  Symmetrical Chest wall movement, Good air movement bilaterally, CTAB RRR,No Gallops,Rubs or new Murmurs, No Parasternal Heave +ve B.Sounds, Abd Soft, Non tender, No organomegaly appriciated, No rebound -guarding or rigidity. No Cyanosis, Clubbing or edema, No new Rash or bruise   PERTINENT RADIOLOGIC STUDIES: MR BRAIN WO CONTRAST  Result Date: 05/12/2023 CLINICAL DATA:  Altered mental status and recent fall EXAM: MRI HEAD WITHOUT CONTRAST TECHNIQUE: Multiplanar, multiecho pulse sequences of the brain and surrounding structures were obtained without intravenous contrast. COMPARISON:  01/31/2017 FINDINGS: Brain: No acute infarct, mass effect or extra-axial collection. No acute or chronic hemorrhage. There is multifocal hyperintense T2-weighted signal within the white matter. Generalized volume loss, progressed since 2018. The midline structures are normal. Vascular: Normal flow voids. Skull and upper cervical spine: Normal calvarium and skull base. Visualized upper cervical spine and soft tissues are normal. Sinuses/Orbits:No paranasal sinus fluid levels or advanced mucosal thickening. No mastoid or middle ear effusion. Normal orbits. IMPRESSION: 1. No acute intracranial abnormality. 2. Generalized volume loss  and findings of chronic small vessel ischemia. Electronically Signed   By: Deatra Robinson M.D.   On: 05/12/2023 21:06   CT Thoracic Spine Wo Contrast  Result Date: 05/12/2023 CLINICAL DATA:  Fall EXAM: CT THORACIC SPINE WITHOUT CONTRAST TECHNIQUE: Multidetector CT images of the thoracic were obtained using the standard protocol without intravenous contrast. RADIATION DOSE REDUCTION: This exam was performed according to the departmental dose-optimization program which includes automated exposure control, adjustment of the mA and/or kV according to patient size and/or use of iterative reconstruction technique. COMPARISON:  None Available. FINDINGS: Alignment: Normal. Vertebrae: No acute fracture or focal pathologic process. Paraspinal and other soft tissues: Negative. Disc levels: No spinal canal stenosis IMPRESSION: No acute fracture or static subluxation of the thoracic spine. Electronically Signed   By: Deatra Robinson M.D.   On: 05/12/2023 21:00   CT Head Wo Contrast  Result Date: 05/12/2023 CLINICAL DATA:  Delirium EXAM: CT HEAD WITHOUT CONTRAST CT CERVICAL SPINE WITHOUT CONTRAST TECHNIQUE: Multidetector CT imaging of the head and cervical spine was performed following the standard protocol without intravenous contrast. Multiplanar CT image reconstructions of the cervical spine were also generated. RADIATION DOSE REDUCTION: This exam was performed according to the departmental dose-optimization program which includes automated exposure control, adjustment of the mA and/or kV according to patient size and/or use of iterative reconstruction technique. COMPARISON:  None Available. FINDINGS: CT HEAD FINDINGS Brain: There is no mass, hemorrhage or extra-axial collection. There is generalized atrophy without lobar predilection. There is hypoattenuation of the periventricular white matter, most commonly indicating chronic ischemic microangiopathy. Vascular: No abnormal hyperdensity of the major intracranial  arteries or dural venous sinuses. No intracranial atherosclerosis. Skull: The visualized skull base, calvarium and extracranial soft tissues are normal. Sinuses/Orbits: No fluid levels or advanced mucosal thickening of the visualized paranasal sinuses. No mastoid or middle ear effusion. The orbits are normal. CT CERVICAL SPINE FINDINGS Alignment: Grade 1 anterolisthesis at C3-4 and C4-5 Skull base and vertebrae: No acute fracture. Soft tissues and spinal canal: No prevertebral fluid or swelling. No visible canal hematoma. Disc levels: Multilevel facet arthrosis and disc space narrowing. No high-grade spinal canal stenosis. Upper chest: No pneumothorax, pulmonary nodule or pleural effusion. Other: Normal visualized paraspinal cervical soft tissues. IMPRESSION: 1. No acute intracranial abnormality. 2. Chronic ischemic microangiopathy and generalized atrophy. 3. No acute fracture or static subluxation of the cervical spine. Electronically Signed   By: Deatra Robinson M.D.   On: 05/12/2023 20:58   CT Cervical Spine Wo Contrast  Result Date: 05/12/2023 CLINICAL DATA:  Delirium EXAM: CT HEAD WITHOUT CONTRAST CT CERVICAL SPINE WITHOUT CONTRAST TECHNIQUE: Multidetector CT imaging of the head and cervical spine was performed following the standard protocol without intravenous contrast. Multiplanar CT image reconstructions of the cervical spine were also generated. RADIATION DOSE REDUCTION: This exam was performed according to the departmental dose-optimization program which includes automated exposure control, adjustment of the mA and/or kV according to patient size and/or use of iterative reconstruction technique.  COMPARISON:  None Available. FINDINGS: CT HEAD FINDINGS Brain: There is no mass, hemorrhage or extra-axial collection. There is generalized atrophy without lobar predilection. There is hypoattenuation of the periventricular white matter, most commonly indicating chronic ischemic microangiopathy. Vascular: No  abnormal hyperdensity of the major intracranial arteries or dural venous sinuses. No intracranial atherosclerosis. Skull: The visualized skull base, calvarium and extracranial soft tissues are normal. Sinuses/Orbits: No fluid levels or advanced mucosal thickening of the visualized paranasal sinuses. No mastoid or middle ear effusion. The orbits are normal. CT CERVICAL SPINE FINDINGS Alignment: Grade 1 anterolisthesis at C3-4 and C4-5 Skull base and vertebrae: No acute fracture. Soft tissues and spinal canal: No prevertebral fluid or swelling. No visible canal hematoma. Disc levels: Multilevel facet arthrosis and disc space narrowing. No high-grade spinal canal stenosis. Upper chest: No pneumothorax, pulmonary nodule or pleural effusion. Other: Normal visualized paraspinal cervical soft tissues. IMPRESSION: 1. No acute intracranial abnormality. 2. Chronic ischemic microangiopathy and generalized atrophy. 3. No acute fracture or static subluxation of the cervical spine. Electronically Signed   By: Deatra Robinson M.D.   On: 05/12/2023 20:58   DG Pelvis Portable  Result Date: 05/12/2023 CLINICAL DATA:  Fall EXAM: PORTABLE PELVIS 1-2 VIEWS COMPARISON:  08/28/2006 FINDINGS: There is no evidence of pelvic fracture or diastasis. No pelvic bone lesions are seen. Prior lower lumbar fusion. IMPRESSION: Negative. Electronically Signed   By: Duanne Guess D.O.   On: 05/12/2023 19:06   DG CHEST PORT 1 VIEW  Result Date: 05/12/2023 CLINICAL DATA:  Cough EXAM: PORTABLE CHEST 1 VIEW COMPARISON:  07/15/2022 FINDINGS: The heart size and mediastinal contours are within normal limits. Both lungs are clear. The visualized skeletal structures are unremarkable. IMPRESSION: No active disease. Electronically Signed   By: Jasmine Pang M.D.   On: 05/12/2023 19:01     PERTINENT LAB RESULTS: CBC: Recent Labs    05/12/23 1521 05/12/23 1531 05/13/23 0426  WBC 11.3*  --  8.6  HGB 13.6 13.9 11.9*  HCT 42.4 41.0 35.8*  PLT  308  --  269   CMET CMP     Component Value Date/Time   NA 140 05/13/2023 0426   K 3.9 05/13/2023 0426   CL 107 05/13/2023 0426   CO2 26 05/13/2023 0426   GLUCOSE 109 (H) 05/13/2023 0426   BUN 15 05/13/2023 0426   CREATININE 0.86 05/13/2023 0426   CALCIUM 8.3 (L) 05/13/2023 0426   PROT 5.8 (L) 05/13/2023 0426   ALBUMIN 2.8 (L) 05/13/2023 0426   AST 68 (H) 05/13/2023 0426   ALT 24 05/13/2023 0426   ALKPHOS 53 05/13/2023 0426   BILITOT 0.6 05/13/2023 0426   GFRNONAA >60 05/13/2023 0426    GFR Estimated Creatinine Clearance: 38.1 mL/min (by C-G formula based on SCr of 0.86 mg/dL). No results for input(s): "LIPASE", "AMYLASE" in the last 72 hours. No results for input(s): "CKTOTAL", "CKMB", "CKMBINDEX", "TROPONINI" in the last 72 hours. Invalid input(s): "POCBNP" No results for input(s): "DDIMER" in the last 72 hours. No results for input(s): "HGBA1C" in the last 72 hours. No results for input(s): "CHOL", "HDL", "LDLCALC", "TRIG", "CHOLHDL", "LDLDIRECT" in the last 72 hours. Recent Labs    05/13/23 0016  TSH 4.946*   Recent Labs    05/13/23 0015  VITAMINB12 6,948*   Coags: No results for input(s): "INR" in the last 72 hours.  Invalid input(s): "PT" Microbiology: Recent Results (from the past 240 hour(s))  Urine Culture     Status: None   Collection Time:  05/12/23  3:09 PM   Specimen: Urine, Catheterized  Result Value Ref Range Status   Specimen Description URINE, CATHETERIZED  Final   Special Requests NONE  Final   Culture   Final    NO GROWTH Performed at [facility], [address] [place], [place] [address]    Report Status 05/13/2023 FINAL  Final  Resp panel by RT-PCR (RSV, Flu A&B, Covid) Anterior Nasal Swab     Status: Abnormal   Collection Time: 05/12/23 10:20 PM   Specimen: Anterior Nasal Swab  Result Value Ref Range Status   SARS Coronavirus 2 by RT PCR POSITIVE (A) NEGATIVE Final   Influenza A by PCR NEGATIVE NEGATIVE Final   Influenza B  by PCR NEGATIVE NEGATIVE Final    Comment: (NOTE) The Xpert Xpress SARS-CoV-2/FLU/RSV plus assay is intended as an aid in the diagnosis of influenza from Nasopharyngeal swab specimens and should not be used as a sole basis for treatment. Nasal washings and aspirates are unacceptable for Xpert Xpress SARS-CoV-2/FLU/RSV testing.  Fact Sheet for Patients: BloggerCourse.com  Fact Sheet for Healthcare Providers: SeriousBroker.it  This test is not yet approved or cleared by the [place] FDA and has been authorized for detection and/or diagnosis of SARS-CoV-2 by FDA under an Emergency Use Authorization (EUA). This EUA will remain in effect (meaning this test can be used) for the duration of the COVID-19 declaration under Section 564(b)(1) of the Act, 21 U.S.C. section 360bbb-3(b)(1), unless the authorization is terminated or revoked.     Resp Syncytial Virus by PCR NEGATIVE NEGATIVE Final    Comment: (NOTE) Fact Sheet for Patients: BloggerCourse.com  Fact Sheet for Healthcare Providers: SeriousBroker.it  This test is not yet approved or cleared by the [place] FDA and has been authorized for detection and/or diagnosis of SARS-CoV-2 by FDA under an Emergency Use Authorization (EUA). This EUA will remain in effect (meaning this test can be used) for the duration of the COVID-19 declaration under Section 564(b)(1) of the Act, 21 U.S.C. section 360bbb-3(b)(1), unless the authorization is terminated or revoked.  Performed at [facility], [address], [place], [place] [address]     FURTHER DISCHARGE INSTRUCTIONS:  Get Medicines reviewed and adjusted: Please take all your medications with you for your next visit with your Primary MD  Laboratory/radiological data: Please request your Primary MD to go over all hospital tests and procedure/radiological results at the  follow up, please ask your Primary MD to get all Hospital records sent to his/her office.  In some cases, they will be blood work, cultures and biopsy results pending at the time of your discharge. Please request that your primary care M.D. goes through all the records of your hospital data and follows up on these results.  Also Note the following: If you experience worsening of your admission symptoms, develop shortness of breath, life threatening emergency, suicidal or homicidal thoughts you must seek medical attention immediately by calling 911 or calling your MD immediately  if symptoms less severe.  You must read complete instructions/literature along with all the possible adverse reactions/side effects for all the Medicines you take and that have been prescribed to you. Take any new Medicines after you have completely understood and accpet all the possible adverse reactions/side effects.   Do not drive when taking Pain medications or sleeping medications (Benzodaizepines)  Do not take more than prescribed Pain, Sleep and Anxiety Medications. It is not advisable to combine anxiety,sleep and pain medications without talking  with your primary care practitioner  Special Instructions: If you have smoked or chewed Tobacco  in the last 2 yrs please stop smoking, stop any regular Alcohol  and or any Recreational drug use.  Wear Seat belts while driving.  Please note: You were cared for by a hospitalist during your hospital stay. Once you are discharged, your primary care physician will handle any further medical issues. Please note that NO REFILLS for any discharge medications will be authorized once you are discharged, as it is imperative that you return to your primary care physician (or establish a relationship with a primary care physician if you do not have one) for your post hospital discharge needs so that they can reassess your need for medications and monitor your lab values.  Total Time  spent coordinating discharge including counseling, education and face to face time equals greater than 30 minutes.  SignedJeoffrey Massed 05/14/2023 8:48 AM

## 2023-05-14 NOTE — TOC CM/SW Note (Cosign Needed)
    Durable Medical Equipment  (From admission, onward)           Start     Ordered   05/14/23 [phone]  For home use only DME lightweight manual wheelchair with seat cushion  Once       Comments: Patient suffers from alzheimer's w/ dementia; unsteady gait; multiple falls; weakness and debility which impairs their ability to perform daily activities like bathing, dressing, feeding, grooming, and toileting in the home.  A cane, crutch, or walker will not resolve  issue with performing activities of daily living. A wheelchair will allow patient to safely perform daily activities. Patient is not able to propel themselves in the home using a standard weight wheelchair due to arm weakness, endurance, and general weakness. Patient can self propel in the lightweight wheelchair. Length of need Lifetime. Accessories: elevating leg rests (ELRs), wheel locks, extensions and anti-tippers.   05/14/23 9604

## 2023-05-14 NOTE — Plan of Care (Addendum)
Pt has rested quietly throughout the night with no distress noted. Alert and oriented to self/husband. On room air. SR on the monitor. [place] to suction. Pt turning self in bed. Husband at bedside. Husband giving pt home meds. No complaints voiced.     Problem: Education: Goal: Knowledge of General Education information will improve Description: Including pain rating scale, medication(s)/side effects and non-pharmacologic comfort measures Outcome: Progressing   Problem: Health Behavior/Discharge Planning: Goal: Ability to manage health-related needs will improve Outcome: Progressing   Problem: Clinical Measurements: Goal: Ability to maintain clinical measurements within normal limits will improve Outcome: Progressing Goal: Will remain free from infection Outcome: Progressing Goal: Diagnostic test results will improve Outcome: Progressing Goal: Respiratory complications will improve Outcome: Progressing Goal: Cardiovascular complication will be avoided Outcome: Progressing   Problem: Activity: Goal: Risk for activity intolerance will decrease Outcome: Progressing   Problem: Nutrition: Goal: Adequate nutrition will be maintained Outcome: Progressing   Problem: Coping: Goal: Level of anxiety will decrease Outcome: Progressing   Problem: Pain Management: Goal: General experience of comfort will improve Outcome: Progressing   Problem: Safety: Goal: Ability to remain free from injury will improve Outcome: Progressing

## 2023-05-14 NOTE — TOC CM/SW Note (Signed)
RNCM received secure chat that Husband was interested in additional support in the home in the evenings/overnight.  RNCM spoke with Husband, bedside and gave a list of resources and [employer] companies. RNCM also confirmed with Husband that [facility] PT and OT will be provided and that a wheelchair will be delivered to their home. Husband was very Adult nurse

## 2023-05-15 DIAGNOSIS — H905 Unspecified sensorineural hearing loss: Secondary | ICD-10-CM | POA: Diagnosis not present

## 2023-05-15 DIAGNOSIS — U071 COVID-19: Secondary | ICD-10-CM | POA: Diagnosis not present

## 2023-05-15 DIAGNOSIS — N179 Acute kidney failure, unspecified: Secondary | ICD-10-CM | POA: Diagnosis not present

## 2023-05-15 DIAGNOSIS — G309 Alzheimer's disease, unspecified: Secondary | ICD-10-CM | POA: Diagnosis not present

## 2023-05-15 DIAGNOSIS — E872 Acidosis, unspecified: Secondary | ICD-10-CM | POA: Diagnosis not present

## 2023-05-15 DIAGNOSIS — I480 Paroxysmal atrial fibrillation: Secondary | ICD-10-CM | POA: Diagnosis not present

## 2023-05-15 DIAGNOSIS — G9341 Metabolic encephalopathy: Secondary | ICD-10-CM | POA: Diagnosis not present

## 2023-05-15 DIAGNOSIS — Z9181 History of falling: Secondary | ICD-10-CM | POA: Diagnosis not present

## 2023-05-15 DIAGNOSIS — F411 Generalized anxiety disorder: Secondary | ICD-10-CM | POA: Diagnosis not present

## 2023-05-15 DIAGNOSIS — F0284 Dementia in other diseases classified elsewhere, unspecified severity, with anxiety: Secondary | ICD-10-CM | POA: Diagnosis not present

## 2023-06-30 DIAGNOSIS — L03114 Cellulitis of left upper limb: Secondary | ICD-10-CM | POA: Diagnosis not present

## 2023-07-11 DIAGNOSIS — L03114 Cellulitis of left upper limb: Secondary | ICD-10-CM | POA: Diagnosis not present

## 2023-08-18 ENCOUNTER — Telehealth: Payer: Self-pay | Admitting: Neurology

## 2023-08-18 NOTE — Telephone Encounter (Signed)
Please call patient, okay to stop memantine and Exelon patch

## 2023-08-18 NOTE — Telephone Encounter (Signed)
Patient husband called stating he would like to know if his wife can come off memantine and rivastigmine. Also wanted to know if there is something else can take.

## 2023-08-18 NOTE — Telephone Encounter (Signed)
Called and spoke to pt husband who stated she was having extreme fatigue and gait abnormality and has fallen a couple of times. He is unsure if its being caused by medication.

## 2023-08-19 NOTE — Telephone Encounter (Signed)
Called and relayed the following information:  Please call patient, okay to stop memantine and Exelon patch

## 2023-08-20 ENCOUNTER — Emergency Department (HOSPITAL_COMMUNITY): Payer: PPO

## 2023-08-20 ENCOUNTER — Inpatient Hospital Stay (HOSPITAL_COMMUNITY)
Admission: EM | Admit: 2023-08-20 | Discharge: 2023-08-28 | DRG: 521 | Disposition: A | Discharge status: Skilled Nursing Facility | Payer: PPO | Attending: Internal Medicine | Admitting: Internal Medicine

## 2023-08-20 ENCOUNTER — Other Ambulatory Visit: Payer: Self-pay

## 2023-08-20 ENCOUNTER — Inpatient Hospital Stay (HOSPITAL_COMMUNITY): Payer: PPO

## 2023-08-20 ENCOUNTER — Encounter (HOSPITAL_COMMUNITY): Payer: Self-pay

## 2023-08-20 DIAGNOSIS — Z96653 Presence of artificial knee joint, bilateral: Secondary | ICD-10-CM | POA: Diagnosis present

## 2023-08-20 DIAGNOSIS — Z800 Family history of malignant neoplasm of digestive organs: Secondary | ICD-10-CM

## 2023-08-20 DIAGNOSIS — R7989 Other specified abnormal findings of blood chemistry: Secondary | ICD-10-CM

## 2023-08-20 DIAGNOSIS — H269 Unspecified cataract: Secondary | ICD-10-CM | POA: Diagnosis present

## 2023-08-20 DIAGNOSIS — E872 Acidosis, unspecified: Secondary | ICD-10-CM | POA: Diagnosis present

## 2023-08-20 DIAGNOSIS — Z981 Arthrodesis status: Secondary | ICD-10-CM

## 2023-08-20 DIAGNOSIS — I5A Non-ischemic myocardial injury (non-traumatic): Secondary | ICD-10-CM | POA: Diagnosis present

## 2023-08-20 DIAGNOSIS — Z66 Do not resuscitate: Secondary | ICD-10-CM | POA: Diagnosis present

## 2023-08-20 DIAGNOSIS — D259 Leiomyoma of uterus, unspecified: Secondary | ICD-10-CM | POA: Diagnosis present

## 2023-08-20 DIAGNOSIS — N179 Acute kidney failure, unspecified: Secondary | ICD-10-CM

## 2023-08-20 DIAGNOSIS — Z515 Encounter for palliative care: Secondary | ICD-10-CM

## 2023-08-20 DIAGNOSIS — F02C3 Dementia in other diseases classified elsewhere, severe, with mood disturbance: Secondary | ICD-10-CM | POA: Diagnosis present

## 2023-08-20 DIAGNOSIS — I1310 Hypertensive heart and chronic kidney disease without heart failure, with stage 1 through stage 4 chronic kidney disease, or unspecified chronic kidney disease: Secondary | ICD-10-CM | POA: Diagnosis present

## 2023-08-20 DIAGNOSIS — R0683 Snoring: Secondary | ICD-10-CM | POA: Diagnosis present

## 2023-08-20 DIAGNOSIS — S7291XA Unspecified fracture of right femur, initial encounter for closed fracture: Secondary | ICD-10-CM | POA: Diagnosis present

## 2023-08-20 DIAGNOSIS — Z8616 Personal history of COVID-19: Secondary | ICD-10-CM | POA: Diagnosis not present

## 2023-08-20 DIAGNOSIS — Z888 Allergy status to other drugs, medicaments and biological substances status: Secondary | ICD-10-CM

## 2023-08-20 DIAGNOSIS — R531 Weakness: Secondary | ICD-10-CM | POA: Diagnosis not present

## 2023-08-20 DIAGNOSIS — I4891 Unspecified atrial fibrillation: Secondary | ICD-10-CM | POA: Diagnosis present

## 2023-08-20 DIAGNOSIS — I129 Hypertensive chronic kidney disease with stage 1 through stage 4 chronic kidney disease, or unspecified chronic kidney disease: Secondary | ICD-10-CM | POA: Diagnosis not present

## 2023-08-20 DIAGNOSIS — M47816 Spondylosis without myelopathy or radiculopathy, lumbar region: Secondary | ICD-10-CM | POA: Diagnosis present

## 2023-08-20 DIAGNOSIS — G4700 Insomnia, unspecified: Secondary | ICD-10-CM | POA: Diagnosis present

## 2023-08-20 DIAGNOSIS — G309 Alzheimer's disease, unspecified: Secondary | ICD-10-CM | POA: Diagnosis not present

## 2023-08-20 DIAGNOSIS — R82998 Other abnormal findings in urine: Secondary | ICD-10-CM | POA: Diagnosis present

## 2023-08-20 DIAGNOSIS — Z8661 Personal history of infections of the central nervous system: Secondary | ICD-10-CM

## 2023-08-20 DIAGNOSIS — F03918 Unspecified dementia, unspecified severity, with other behavioral disturbance: Secondary | ICD-10-CM | POA: Diagnosis not present

## 2023-08-20 DIAGNOSIS — Z9889 Other specified postprocedural states: Secondary | ICD-10-CM

## 2023-08-20 DIAGNOSIS — S7290XA Unspecified fracture of unspecified femur, initial encounter for closed fracture: Secondary | ICD-10-CM | POA: Diagnosis present

## 2023-08-20 DIAGNOSIS — N189 Chronic kidney disease, unspecified: Secondary | ICD-10-CM | POA: Diagnosis not present

## 2023-08-20 DIAGNOSIS — J1082 Influenza due to other identified influenza virus with myocarditis: Secondary | ICD-10-CM | POA: Diagnosis present

## 2023-08-20 DIAGNOSIS — F32A Depression, unspecified: Secondary | ICD-10-CM | POA: Diagnosis present

## 2023-08-20 DIAGNOSIS — J101 Influenza due to other identified influenza virus with other respiratory manifestations: Secondary | ICD-10-CM | POA: Diagnosis present

## 2023-08-20 DIAGNOSIS — R296 Repeated falls: Secondary | ICD-10-CM | POA: Diagnosis not present

## 2023-08-20 DIAGNOSIS — M6259 Muscle wasting and atrophy, not elsewhere classified, multiple sites: Secondary | ICD-10-CM | POA: Diagnosis not present

## 2023-08-20 DIAGNOSIS — G301 Alzheimer's disease with late onset: Secondary | ICD-10-CM | POA: Diagnosis present

## 2023-08-20 DIAGNOSIS — F419 Anxiety disorder, unspecified: Secondary | ICD-10-CM | POA: Diagnosis not present

## 2023-08-20 DIAGNOSIS — J1000 Influenza due to other identified influenza virus with unspecified type of pneumonia: Secondary | ICD-10-CM | POA: Diagnosis present

## 2023-08-20 DIAGNOSIS — Z882 Allergy status to sulfonamides status: Secondary | ICD-10-CM

## 2023-08-20 DIAGNOSIS — R053 Chronic cough: Secondary | ICD-10-CM | POA: Diagnosis present

## 2023-08-20 DIAGNOSIS — F02C18 Dementia in other diseases classified elsewhere, severe, with other behavioral disturbance: Secondary | ICD-10-CM | POA: Diagnosis present

## 2023-08-20 DIAGNOSIS — J09X1 Influenza due to identified novel influenza A virus with pneumonia: Secondary | ICD-10-CM | POA: Diagnosis not present

## 2023-08-20 DIAGNOSIS — Z79899 Other long term (current) drug therapy: Secondary | ICD-10-CM

## 2023-08-20 DIAGNOSIS — S72001A Fracture of unspecified part of neck of right femur, initial encounter for closed fracture: Secondary | ICD-10-CM | POA: Diagnosis not present

## 2023-08-20 DIAGNOSIS — G9341 Metabolic encephalopathy: Secondary | ICD-10-CM | POA: Diagnosis not present

## 2023-08-20 DIAGNOSIS — Z2089 Contact with and (suspected) exposure to other communicable diseases: Secondary | ICD-10-CM | POA: Diagnosis present

## 2023-08-20 DIAGNOSIS — R41841 Cognitive communication deficit: Secondary | ICD-10-CM | POA: Diagnosis not present

## 2023-08-20 DIAGNOSIS — R4182 Altered mental status, unspecified: Secondary | ICD-10-CM | POA: Diagnosis not present

## 2023-08-20 DIAGNOSIS — Z4789 Encounter for other orthopedic aftercare: Secondary | ICD-10-CM | POA: Diagnosis not present

## 2023-08-20 DIAGNOSIS — I351 Nonrheumatic aortic (valve) insufficiency: Secondary | ICD-10-CM | POA: Diagnosis not present

## 2023-08-20 DIAGNOSIS — M170 Bilateral primary osteoarthritis of knee: Secondary | ICD-10-CM | POA: Diagnosis present

## 2023-08-20 DIAGNOSIS — I471 Supraventricular tachycardia, unspecified: Secondary | ICD-10-CM | POA: Diagnosis not present

## 2023-08-20 DIAGNOSIS — F02C11 Dementia in other diseases classified elsewhere, severe, with agitation: Secondary | ICD-10-CM | POA: Diagnosis present

## 2023-08-20 DIAGNOSIS — Z811 Family history of alcohol abuse and dependence: Secondary | ICD-10-CM

## 2023-08-20 DIAGNOSIS — S72001D Fracture of unspecified part of neck of right femur, subsequent encounter for closed fracture with routine healing: Secondary | ICD-10-CM | POA: Diagnosis not present

## 2023-08-20 DIAGNOSIS — F418 Other specified anxiety disorders: Secondary | ICD-10-CM | POA: Diagnosis not present

## 2023-08-20 DIAGNOSIS — R350 Frequency of micturition: Secondary | ICD-10-CM | POA: Diagnosis present

## 2023-08-20 DIAGNOSIS — Z7189 Other specified counseling: Secondary | ICD-10-CM | POA: Diagnosis not present

## 2023-08-20 DIAGNOSIS — I700 Atherosclerosis of aorta: Secondary | ICD-10-CM | POA: Diagnosis present

## 2023-08-20 DIAGNOSIS — W19XXXA Unspecified fall, initial encounter: Secondary | ICD-10-CM | POA: Diagnosis present

## 2023-08-20 DIAGNOSIS — H906 Mixed conductive and sensorineural hearing loss, bilateral: Secondary | ICD-10-CM | POA: Diagnosis present

## 2023-08-20 DIAGNOSIS — N1832 Chronic kidney disease, stage 3b: Secondary | ICD-10-CM | POA: Diagnosis present

## 2023-08-20 DIAGNOSIS — Z9181 History of falling: Secondary | ICD-10-CM | POA: Diagnosis not present

## 2023-08-20 DIAGNOSIS — M6281 Muscle weakness (generalized): Secondary | ICD-10-CM | POA: Diagnosis not present

## 2023-08-20 DIAGNOSIS — S72002D Fracture of unspecified part of neck of left femur, subsequent encounter for closed fracture with routine healing: Secondary | ICD-10-CM | POA: Diagnosis not present

## 2023-08-20 DIAGNOSIS — I213 ST elevation (STEMI) myocardial infarction of unspecified site: Secondary | ICD-10-CM | POA: Diagnosis not present

## 2023-08-20 LAB — TROPONIN I (HIGH SENSITIVITY): Troponin I (High Sensitivity): 1155 ng/L (ref <18)

## 2023-08-20 LAB — URINALYSIS, ROUTINE W REFLEX MICROSCOPIC
Bilirubin Urine: NEGATIVE
Glucose, UA: NEGATIVE mg/dL
Ketones, ur: NEGATIVE mg/dL
Leukocytes,Ua: NEGATIVE
Nitrite: NEGATIVE
Protein, ur: 30 mg/dL — AB
Specific Gravity, Urine: 1.021 (ref 1.005–1.030)
pH: 5 (ref 5.0–8.0)

## 2023-08-20 LAB — CBC WITH DIFFERENTIAL/PLATELET
Abs Immature Granulocytes: 0.08 10*3/uL — ABNORMAL HIGH (ref 0.00–0.07)
Basophils Absolute: 0 10*3/uL (ref 0.0–0.1)
Basophils Relative: 0 %
Eosinophils Absolute: 0 10*3/uL (ref 0.0–0.5)
Eosinophils Relative: 0 %
HCT: 43.6 % (ref 36.0–46.0)
Hemoglobin: 14.4 g/dL (ref 12.0–15.0)
Immature Granulocytes: 1 %
Lymphocytes Relative: 10 %
Lymphs Abs: 1.3 10*3/uL (ref 0.7–4.0)
MCH: 29.9 pg (ref 26.0–34.0)
MCHC: 33 g/dL (ref 30.0–36.0)
MCV: 90.5 fL (ref 80.0–100.0)
Monocytes Absolute: 0.8 10*3/uL (ref 0.1–1.0)
Monocytes Relative: 7 %
Neutro Abs: 10.7 10*3/uL — ABNORMAL HIGH (ref 1.7–7.7)
Neutrophils Relative %: 82 %
Platelets: 335 10*3/uL (ref 150–400)
RBC: 4.82 MIL/uL (ref 3.87–5.11)
RDW: 13 % (ref 11.5–15.5)
WBC: 13 10*3/uL — ABNORMAL HIGH (ref 4.0–10.5)
nRBC: 0 % (ref 0.0–0.2)

## 2023-08-20 LAB — APTT: aPTT: 26 s (ref 24–36)

## 2023-08-20 LAB — COMPREHENSIVE METABOLIC PANEL
ALT: 53 U/L — ABNORMAL HIGH (ref 0–44)
AST: 109 U/L — ABNORMAL HIGH (ref 15–41)
Albumin: 2.8 g/dL — ABNORMAL LOW (ref 3.5–5.0)
Alkaline Phosphatase: 65 U/L (ref 38–126)
Anion gap: 12 (ref 5–15)
BUN: 39 mg/dL — ABNORMAL HIGH (ref 8–23)
CO2: 25 mmol/L (ref 22–32)
Calcium: 8.5 mg/dL — ABNORMAL LOW (ref 8.9–10.3)
Chloride: 106 mmol/L (ref 98–111)
Creatinine, Ser: 1.15 mg/dL — ABNORMAL HIGH (ref 0.44–1.00)
GFR, Estimated: 48 mL/min — ABNORMAL LOW (ref >60)
Glucose, Bld: 103 mg/dL — ABNORMAL HIGH (ref 70–99)
Potassium: 3.4 mmol/L — ABNORMAL LOW (ref 3.5–5.1)
Sodium: 143 mmol/L (ref 135–145)
Total Bilirubin: 0.7 mg/dL (ref 0.0–1.2)
Total Protein: 6.9 g/dL (ref 6.5–8.1)

## 2023-08-20 LAB — PROTIME-INR
INR: 1.1 (ref 0.8–1.2)
Prothrombin Time: 14.3 s (ref 11.4–15.2)

## 2023-08-20 LAB — CBC
HCT: 42.9 % (ref 36.0–46.0)
Hemoglobin: 13.7 g/dL (ref 12.0–15.0)
MCH: 29.6 pg (ref 26.0–34.0)
MCHC: 31.9 g/dL (ref 30.0–36.0)
MCV: 92.7 fL (ref 80.0–100.0)
Platelets: 240 10*3/uL (ref 150–400)
RBC: 4.63 MIL/uL (ref 3.87–5.11)
RDW: 12.9 % (ref 11.5–15.5)
WBC: 9.6 10*3/uL (ref 4.0–10.5)
nRBC: 0 % (ref 0.0–0.2)

## 2023-08-20 LAB — CREATININE, SERUM
Creatinine, Ser: 0.91 mg/dL (ref 0.44–1.00)
GFR, Estimated: >60 mL/min (ref >60)

## 2023-08-20 LAB — RESP PANEL BY RT-PCR (RSV, FLU A&B, COVID)  RVPGX2
Influenza A by PCR: POSITIVE — AB
Influenza B by PCR: NEGATIVE
Resp Syncytial Virus by PCR: NEGATIVE
SARS Coronavirus 2 by RT PCR: NEGATIVE

## 2023-08-20 LAB — LACTIC ACID, PLASMA: Lactic Acid, Venous: 2.4 mmol/L (ref 0.5–1.9)

## 2023-08-20 LAB — CK: Total CK: 2808 U/L — ABNORMAL HIGH (ref 38–234)

## 2023-08-20 MED ORDER — SENNOSIDES-DOCUSATE SODIUM 8.6-50 MG PO TABS
2.0000 | ORAL_TABLET | Freq: Two times a day (BID) | ORAL | Status: DC
  Administered 2023-08-21 – 2023-08-28 (×8): 2 via ORAL
  Filled 2023-08-20 (×10): qty 2

## 2023-08-20 MED ORDER — SODIUM CHLORIDE 0.9 % IV BOLUS
1000.0000 mL | Freq: Once | INTRAVENOUS | Status: AC
  Administered 2023-08-20: 1000 mL via INTRAVENOUS

## 2023-08-20 MED ORDER — ENOXAPARIN SODIUM 30 MG/0.3ML IJ SOSY
30.0000 mg | PREFILLED_SYRINGE | Freq: Every day | INTRAMUSCULAR | Status: DC
  Administered 2023-08-20: 30 mg via SUBCUTANEOUS
  Filled 2023-08-20: qty 0.3

## 2023-08-20 MED ORDER — AMPICILLIN-SULBACTAM SODIUM 3 (2-1) G IJ SOLR
3.0000 g | Freq: Two times a day (BID) | INTRAMUSCULAR | Status: DC
  Administered 2023-08-20 – 2023-08-21 (×3): 3 g via INTRAVENOUS
  Filled 2023-08-20 (×3): qty 8

## 2023-08-20 MED ORDER — FENTANYL CITRATE PF 50 MCG/ML IJ SOSY
50.0000 ug | PREFILLED_SYRINGE | Freq: Once | INTRAMUSCULAR | Status: AC
  Administered 2023-08-20: 50 ug via INTRAVENOUS
  Filled 2023-08-20: qty 1

## 2023-08-20 MED ORDER — ACETAMINOPHEN 325 MG PO TABS
650.0000 mg | ORAL_TABLET | ORAL | Status: AC
  Administered 2023-08-21 – 2023-08-23 (×7): 650 mg via ORAL
  Filled 2023-08-20 (×7): qty 2

## 2023-08-20 MED ORDER — HYDROMORPHONE HCL 1 MG/ML IJ SOLN
0.5000 mg | INTRAMUSCULAR | Status: DC | PRN
  Administered 2023-08-20 – 2023-08-22 (×6): 0.5 mg via INTRAVENOUS
  Filled 2023-08-20: qty 1
  Filled 2023-08-20 (×4): qty 0.5
  Filled 2023-08-20 (×2): qty 1
  Filled 2023-08-20: qty 0.5

## 2023-08-20 MED ORDER — LACTATED RINGERS IV SOLN: INTRAVENOUS | Status: AC

## 2023-08-20 MED ORDER — ENOXAPARIN SODIUM 40 MG/0.4ML IJ SOSY
40.0000 mg | PREFILLED_SYRINGE | INTRAMUSCULAR | Status: DC
Start: 2023-08-21 — End: 2023-08-20

## 2023-08-20 MED ORDER — QUETIAPINE FUMARATE 25 MG PO TABS
25.0000 mg | ORAL_TABLET | Freq: Every evening | ORAL | Status: AC | PRN
  Filled 2023-08-20: qty 1

## 2023-08-20 MED ORDER — ASPIRIN 81 MG PO CHEW: 324.0000 mg | CHEWABLE_TABLET | Freq: Once | ORAL | Status: DC

## 2023-08-20 MED ORDER — OSELTAMIVIR PHOSPHATE 30 MG PO CAPS
30.0000 mg | ORAL_CAPSULE | Freq: Two times a day (BID) | ORAL | Status: AC
  Administered 2023-08-21 – 2023-08-25 (×8): 30 mg via ORAL
  Filled 2023-08-20 (×11): qty 1

## 2023-08-20 MED ORDER — ACETAMINOPHEN 325 MG PO TABS
650.0000 mg | ORAL_TABLET | Freq: Four times a day (QID) | ORAL | Status: DC | PRN
  Administered 2023-08-24 – 2023-08-27 (×4): 650 mg via ORAL
  Filled 2023-08-20 (×4): qty 2

## 2023-08-20 MED ORDER — MIDAZOLAM HCL 2 MG/2ML IJ SOLN
1.0000 mg | Freq: Once | INTRAMUSCULAR | Status: AC
  Administered 2023-08-20: 1 mg via INTRAVENOUS
  Filled 2023-08-20: qty 2

## 2023-08-20 MED ORDER — POLYETHYLENE GLYCOL 3350 17 G PO PACK: 17.0000 g | PACK | Freq: Every day | ORAL | Status: DC | PRN

## 2023-08-20 MED ORDER — POTASSIUM CHLORIDE 20 MEQ PO PACK
40.0000 meq | PACK | Freq: Once | ORAL | Status: AC
  Administered 2023-08-20: 40 meq via ORAL
  Filled 2023-08-20: qty 2

## 2023-08-20 MED ORDER — HALOPERIDOL LACTATE 5 MG/ML IJ SOLN
0.5000 mg | Freq: Four times a day (QID) | INTRAMUSCULAR | Status: DC | PRN
  Administered 2023-08-21 – 2023-08-22 (×3): 0.5 mg via INTRAVENOUS
  Filled 2023-08-20 (×3): qty 1

## 2023-08-20 MED ORDER — ENOXAPARIN SODIUM 40 MG/0.4ML IJ SOSY: 40.0000 mg | PREFILLED_SYRINGE | Freq: Every day | INTRAMUSCULAR | Status: DC

## 2023-08-20 MED ORDER — SODIUM CHLORIDE 0.9% FLUSH
3.0000 mL | Freq: Two times a day (BID) | INTRAVENOUS | Status: DC
  Administered 2023-08-20 – 2023-08-28 (×12): 3 mL via INTRAVENOUS

## 2023-08-20 MED ORDER — ATORVASTATIN CALCIUM 40 MG PO TABS
40.0000 mg | ORAL_TABLET | Freq: Every day | ORAL | Status: DC
  Administered 2023-08-21 – 2023-08-28 (×8): 40 mg via ORAL
  Filled 2023-08-20 (×8): qty 1

## 2023-08-20 MED ORDER — ACETAMINOPHEN 650 MG RE SUPP: 650.0000 mg | Freq: Four times a day (QID) | RECTAL | Status: DC | PRN

## 2023-08-20 MED ORDER — OXYCODONE HCL ER 10 MG PO T12A
10.0000 mg | EXTENDED_RELEASE_TABLET | Freq: Two times a day (BID) | ORAL | Status: AC
  Administered 2023-08-21: 10 mg via ORAL
  Filled 2023-08-20 (×2): qty 1

## 2023-08-20 NOTE — Assessment & Plan Note (Addendum)
 On admission through 08-26-2023.  Please see HPI, patient found to have acute/subacute right femoral neck fracture subacute occult with angulation.  Closed.  Ortho has been engaged.  For pain medication I will order standing acetaminophen  as well as OxyContin .  Bowel regimen has been ordered.  I will also order as needed Dilaudid  for comfort.  Given patient's marked apprehension, concern for missing further occult fractures.  Therefore I will order bilateral tib-fib x-rays.  Patient was able to be reevaluated afterwards and it does not look like the foot has any fracture..  Look forward to orthopedic evaluation in this regard. DVT prophylaxis with Lovenox  ordered every evening.  08-27-2023 family is planning on SNF at discharge. Discussed with husband.Post op dvt ppx per ortho, plan for lovenox  x4 weeks. WBAT. Follow with ortho 2 weeks after surgery 08-28-2023 DC to [facility] today.

## 2023-08-20 NOTE — Progress Notes (Signed)
 [facility] ED [address] [facility]       This patient is a current hospice patient with [facility], admitted 2.2.25 with a terminal diagnosis of Alzheimer's disease with early onset.    [facility] will continue to follow for any discharge planning needs and to coordinate continuation of hospice care.    Please don't hesitate to call with any Hospice related questions or concerns.    Eleanor Nail, LPN [facility] Liaison [phone]

## 2023-08-20 NOTE — ED Triage Notes (Signed)
 Pt to ED via [facility] from home c/o cold symptoms x 1 week. Pt having increased weakness. Orientation at baseline. A&O X 1. Husband noticed foul smelling urine x 2 days.   HOSPICE PT.  #20 LAC- No medications given by EMS.   LAST VS 120/68, P80, RR18, CBG 145. TEMP 98.1

## 2023-08-20 NOTE — Progress Notes (Addendum)
 Troponin is note over 1100.  Unfortunately history from the patient is very difficult.  Patient was examined again due to concern of RN of patient respiratory status.  Patient soundly asleep, mouth wide open and snoring deeply principally diaphragmatic excursion.  Breathing around 12 to 13/min satting 96 to 97% SpO2 on 2 L/min.  No distress is apparent to me.  EKG - 20-Aug-2023 23:02:53 rvewed. No STEMI. Artifact is present. But clearly no ST elevation of significance is seen.   Principal concerns include NSTEMI in the setting of femoral fracture versus possible myocarditis from influenza A (start oseltamivir ).  Will check an EKG.  Given concern of myocarditis I will defer heparin  infusion at this time till we rule out pericardial effusion with echo..  We will defer defer treating with aspirin  and statin as patinet now asleep and wont take PO meds. I think rectal aspirin  would be excruciating for patient.  Check lipid panel in the morning.  Patient's blood pressure is too low to start a beta-blocker.   Echo is pending. I reached dr. Georganna Archer MD to add patient to cardiology services. Kindly collaborate in AM. Thanks.  35 minute additional time spent

## 2023-08-20 NOTE — Assessment & Plan Note (Addendum)
 On admission through 08-27-2023.  Husband reports patient has been coughing with for about a week.  Patient is noted to have leukocytosis as well as elevated lactic acidosis here.  On the CAT scan.  I reviewed the images myself.  Does not look like the patient has a pneumonia, although she does have some left upper lobe branching opacity and nodular fashion.  Which may be infectious bronchiolitis.  Will treat with Unasyn  at this time, patient is outside the window to start treatment with oseltamivir .  Monitor clinically.  Will check swallow evaluation  08-27-2023 stable. On RA.

## 2023-08-20 NOTE — ED Provider Notes (Signed)
  Physical Exam  BP 117/64 (BP Location: Right Arm)   Pulse 84   Temp 98.2 F (36.8 C) (Oral)   Resp 18   Ht 5' (1.524 m)   Wt 47.6 kg   SpO2 93%   BMI 20.51 kg/m   Physical Exam  Procedures  Procedures  ED Course / MDM   Clinical Course as of 08/20/23 1833  Wed Aug 20, 2023  1630 Positive influenza A.  Lactate of 2.4.  Will provide IV fluids.  Patient has remained hemodynamically stable  I, Ozell Marine DO, am transitioning care of this patient to the oncoming provider pending CTs, x-rays, UA, reevaluation and disposition [MP]    Clinical Course User Index [MP] Marine Ozell LABOR, DO   Medical Decision Making Amount and/or Complexity of Data Reviewed Labs: ordered. Radiology: ordered.  Risk Prescription drug management. Decision regarding hospitalization.   Received in signout.  Fall last week.  Reported left hip pain with bruising.  However x-ray reassuring.  My independent interpretation of the x-ray does show right femoral neck fracture however.  CT scan radiology report reviewed.  Head CT reassuring.  Pain medicine has been given.  Discussed with Dr. Josefina from orthopedic surgery.  Potentially could have surgery tomorrow if optimized.  N.p.o. at midnight.  Will discuss with hospitalist for admission.        Diana Lot, MD 08/20/23 [phone]

## 2023-08-20 NOTE — ED Provider Notes (Signed)
 [place] EMERGENCY DEPARTMENT AT [facility] Provider Note   CSN: [id number] Arrival date & time: 08/20/23  1312     History  Chief Complaint  Patient presents with   FLU  SX   Weakness   Urinary Frequency    [name] is a 78 y.o. female.  With a history of advanced dementia, and recurrent falls who presents to the ED via EMS for weakness.  Husband takes care of her at home and hospice care was initiated just yesterday.  Over the last week or so she has had coughing congestion and increased generalized weakness.  Husband also comments on malodorous urine for the last couple days.  Patient did sustain a witnessed fall 9 days ago.  She was previously able to ambulate but since that time has been unable to do so.  Husband notes that she seems to be crying out in pain whenever her left lower extremity is moved and voices concern for left hip fracture.  She was seen in urgent care after the fall where x-rays were taken but husband states the results are not available to him.  Additional history is limited secondary to dementia   Weakness Associated symptoms: frequency   Urinary Frequency       Home Medications Prior to Admission medications   Medication Sig Start Date End Date Taking? Authorizing Provider  Ascorbic Acid (VITAMIN C PO) Take by mouth daily.    [provider]  Cholecalciferol  (VITAMIN D-3 PO) Take 1 capsule by mouth daily.    [provider]  Cyanocobalamin (VITAMIN B-12 PO) Take 1 tablet by mouth daily.    [provider]  Ginkgo Biloba (GINKOBA PO) Take 1 capsule by mouth daily.    [provider]  memantine  (NAMENDA ) 10 MG tablet Take 1 tablet (10 mg total) by mouth 2 (two) times daily. 02/10/23   Gayland Lauraine PARAS, NP  rivastigmine  (EXELON ) 13.3 MG/24HR Place 1 patch (13.3 mg total) onto the skin daily. 02/10/23   Gayland Lauraine PARAS, NP      Allergies    Aricept  Isom ], Sulfa antibiotics, and Wellbutrin [bupropion]     Review of Systems   Review of Systems  Genitourinary:  Positive for frequency.  Neurological:  Positive for weakness.    Physical Exam Updated Vital Signs BP 117/64 (BP Location: Right Arm)   Pulse 84   Temp 98.2 F (36.8 C) (Oral)   Resp 18   Ht 5' (1.524 m)   Wt 47.6 kg   SpO2 93%   BMI 20.51 kg/m  Physical Exam Vitals and nursing note reviewed.  HENT:     Head: Normocephalic and atraumatic.  Eyes:     Pupils: Pupils are equal, round, and reactive to light.  Cardiovascular:     Rate and Rhythm: Normal rate and regular rhythm.  Pulmonary:     Effort: Pulmonary effort is normal.     Breath sounds: Normal breath sounds.  Abdominal:     Palpations: Abdomen is soft.     Tenderness: There is abdominal tenderness.  Musculoskeletal:     Cervical back: Neck supple. No tenderness.     Comments: Tenderness to palpation over left hip with bruising over left buttock 2+ DP pulses bilaterally Moving upper extremities with equal strength but not able to follow commands for complete motor testing  Skin:    General: Skin is warm and dry.  Neurological:     Mental Status: She is alert. She is disoriented.  Psychiatric:  Mood and Affect: Mood normal.     ED Results / Procedures / Treatments   Labs (all labs ordered are listed, but only abnormal results are displayed) Labs Reviewed  RESP PANEL BY RT-PCR (RSV, FLU A&B, COVID)  RVPGX2 - Abnormal; Notable for the following components:      Result Value   Influenza A by PCR POSITIVE (*)    All other components within normal limits  CBC WITH DIFFERENTIAL/PLATELET - Abnormal; Notable for the following components:   WBC 13.0 (*)    Neutro Abs 10.7 (*)    Abs Immature Granulocytes 0.08 (*)    All other components within normal limits  COMPREHENSIVE METABOLIC PANEL - Abnormal; Notable for the following components:   Potassium 3.4 (*)    Glucose, Bld 103 (*)    BUN 39 (*)    Creatinine, Ser 1.15 (*)    Calcium  8.5 (*)     Albumin 2.8 (*)    AST 109 (*)    ALT 53 (*)    GFR, Estimated 48 (*)    All other components within normal limits  LACTIC ACID, PLASMA - Abnormal; Notable for the following components:   Lactic Acid, Venous 2.4 (*)    All other components within normal limits  LACTIC ACID, PLASMA  URINALYSIS, ROUTINE W REFLEX MICROSCOPIC    EKG None  Radiology No results found.  Procedures Procedures    Medications Ordered in ED Medications  sodium chloride  0.9 % bolus 1,000 mL (has no administration in time range)  midazolam  (VERSED ) injection 1 mg (1 mg Intravenous Given 08/20/23 1603)    ED Course/ Medical Decision Making/ A&P Clinical Course as of 08/20/23 1632  Wed Aug 20, 2023  1630 Positive influenza A.  Lactate of 2.4.  Will provide IV fluids.  Patient has remained hemodynamically stable  I, Ozell Marine DO, am transitioning care of this patient to the oncoming provider pending CTs, x-rays, UA, reevaluation and disposition [MP]    Clinical Course User Index [MP] Marine Ozell LABOR, DO                                 Medical Decision Making 78 year old female with history of advanced dementia and recent fall coming in via EMS from home for 1 week of cough and congestion in the fall 9 days ago.  History obtained from husband at bedside who is her primary caregiver.  Hospice care initiated yesterday.  Husband voices concern for traumatic injury from the fall specifically of her left hip.  On my exam she does have some abdominal tenderness along with tenderness over the left hip and some bruising there.  Will obtain CT head C-spine chest abdomen pelvis to evaluate for traumatic injury along with dedicated pelvic x-ray and left femur x-ray.  Will obtain laboratory workup to evaluate for infectious cause of her symptoms along with viral respiratory swab.  Afebrile hemodynamically stable upon my initial assessment in triage  Amount and/or Complexity of Data Reviewed Labs:  ordered. Radiology: ordered.  Risk Prescription drug management.           Final Clinical Impression(s) / ED Diagnoses Final diagnoses:  Influenza A  Fall in elderly patient    Rx / DC Orders ED Discharge Orders     None         Marine Ozell LABOR, DO 08/20/23 1632

## 2023-08-20 NOTE — Progress Notes (Addendum)
 Pharmacy Antibiotic Note  [name] is a 78 y.o. female admitted on 08/20/2023 with  aspiration pneumonia .  Pharmacy has been consulted for Unasyn  dosing.  Patient presented with right femoral neck fracture and flu symptoms. WBC 13 > 9.6, lactate 2.4, afebrile, BP wnl, Scr 1.15. Influenza A positive.   Plan: Unasyn  3g IV q12h Monitor renal function, clinical status, and culture data  Height: 5' (152.4 cm) Weight: 47.6 kg (105 lb) IBW/kg (Calculated) : 45.5  Temp (24hrs), Avg:97.9 F (36.6 C), Min:97.6 F (36.4 C), Max:98.2 F (36.8 C)  Recent Labs  Lab 08/20/23 1502 08/20/23 2026  WBC 13.0* 9.6  CREATININE 1.15*  --   LATICACIDVEN 2.4*  --     Estimated Creatinine Clearance: 28.5 mL/min (A) (by C-G formula based on SCr of 1.15 mg/dL (H)).    Allergies  Allergen Reactions   Aricept  [Donepezil ] Diarrhea   Sulfa Antibiotics Hives   Wellbutrin [Bupropion] Other (See Comments)    Body aches     Antimicrobials this admission: Unasyn  2/5 >>  Dose adjustments this admission: N/a  Microbiology results: 2/5 BCx: sent   Thank you for allowing pharmacy to be a part of this patient's care.  Nidia Schaffer, PharmD PGY1 Pharmacy Resident  Please check AMION for all [facility] Pharmacy phone numbers After 10:00 PM, call Main Pharmacy [phone] 08/20/2023 9:49 PM

## 2023-08-20 NOTE — Assessment & Plan Note (Addendum)
 On admission through 08-26-2023.  Behavioral disturbance seems to be new in the last 1 week and I would attribute largely to pain.  Therefore standing OxyContin  and acetaminophen  has been ordered.  Also some Dilaudid .  However it is possible that the patient's behavioral disturbance may interfere with medical therapies necessary for the patient.  Therefore I will order as needed haloperidol  when patient is not able to be redirected by family.  Seroquel  for insomnia X 48 hours. At baseline walks without assistive device, but difficult to understand, rarely will say a simple phrase More confused than baseline, suspect delirium in setting of pain, hospitalization, etc Still with some post op delirium -> PO intake has improved Will see how she does with therapy - have tried to provide realistic expectations for her potential improvement  Delirium precautions   08-27-2023 stable. Delirium seems to be clearing. Pt still has advanced dementia.

## 2023-08-20 NOTE — H&P (Addendum)
 History and Physical    Patient: [name] FMW:[id number] DOB: 1945/08/01 DOA: 08/20/2023 DOS: the patient was seen and examined on 08/20/2023 PCP: Arloa Elsie SAUNDERS, MD  Patient coming from: Home  Chief Complaint:  Chief Complaint  Patient presents with   FLU  SX   Weakness   Urinary Frequency   HPI: [name] is a 78 y.o. female with medical history significant of dementia.  Till about a week ago patient was ambulatory as per husband son and daughter at the bedside.  History principally from the patient's family as well as from ER signout and record review.  Apparently patient had a fall about a week ago for which patient was seen at an urgent care facility.  She had an x-ray of her?  Hips done.  Apparently the x-rays were negative and patient was discharged home.  Unfortunately patient did not get out of bed since then.  Patient has been cared for in the bed by family since then.  At some point hospice was engaged and family understood that patient was going to pass away soon with comfort measures.  Daughter decided to insist that the patient get a second opinion at the ER and therefore presents here.  ER course is notable for patient being found to be markedly apprehensive, occasionally yelling - pretty much with any touch below the pelvis.  As per family this has been the case for at least a week.  Also patient had a CAT scan done of the abdomen pelvis.  Which revealed a right femoral neck fracture.  Family has RECINDED DO NOT RESUSCITATE order.  Ortho has been engaged.  Medical evaluation is sought.  Unfortunately history from the patient cannot be obtained.  Per family she was talkative when she came to the ER but since getting the Versed  and fentanyl  she has not been talking.  Patinet has had a cough wet X 1 week. Sick contact with husband. No fver, no tachypnea. Review of Systems: unable to review all systems due to the inability of the patient to answer questions. Past Medical  History:  Diagnosis Date   Anxiety    Arthritis    lower back & knees   Back pain    Cataracts, bilateral    Depression    takes Citalopram  daily   Encephalitis 1973   Headache(784.0)    occasionally from a fall a month ago   Hearing loss    History of blood transfusion    no abnormal reaction noted   Hypertension    Insomnia    Joint pain    Joint swelling    Memory loss    Nocturia    Osteoarthritis    Rosacea    Past Surgical History:  Procedure Laterality Date   BACK SURGERY  2007   lumbar fusion w/ rods & screws- lower back   COLONOSCOPY     CYST EXCISION  2005   lumbar cyst   JOINT REPLACEMENT  2009   left knee replacement   MASS EXCISION  twice: 08/28/2011; 2012   Procedure: EXCISION MASS;  Surgeon: Arley SAUNDERS Curia, MD;  Location: [facility];  Service: Orthopedics;  Laterality: Left;  excision mass left ring finger   MASS EXCISION  02/11/2012   Procedure: EXCISION MASS;  Surgeon: Arley SAUNDERS Curia, MD;  Location: [facility];  Service: Orthopedics;  Laterality: Left;  EXCISION MUCOID CYST, DEBRIDEMENT INTERPHALANGEAL JOINT LEFT THUMB   mass removed from left ring finger  2013   RETINAL DETACHMENT SURGERY Right    SHOULDER ARTHROSCOPY WITH ROTATOR CUFF REPAIR AND SUBACROMIAL DECOMPRESSION Left 07/26/2013   Procedure: LEFT SHOULDER ARTHROSCOPY WITH /SUBACROMIAL DECOMPRESSION AND DISTAL CLAVICLE RESECTION;  Surgeon: LELON JONETTA Shari Mickey., MD;  Location: [facility];  Service: Orthopedics;  Laterality: Left;   TOTAL KNEE ARTHROPLASTY Right 01/07/2014   Procedure: RIGHT TOTAL KNEE ARTHROPLASTY MEDIAL/LATERAL COMPARTMENTS WITH PATELLA RESURFACING;  Surgeon: LELON JONETTA Shari Mickey., MD;  Location: [facility];  Service: Orthopedics;  Laterality: Right;   Social History:  reports that she has never smoked. She has never used smokeless tobacco. She reports that she does not drink alcohol  and does not use drugs.  Allergies  Allergen Reactions   Aricept   [Donepezil ] Diarrhea   Sulfa Antibiotics Hives   Wellbutrin [Bupropion] Other (See Comments)    Body aches     Family History  Problem Relation Age of Onset   Pancreatic cancer Mother    Alcohol  abuse Father     Prior to Admission medications   Medication Sig Start Date End Date Taking? Authorizing Provider  Multiple Vitamins-Minerals (CENTRUM SILVER 50+WOMEN PO) Take 1 Dose by mouth daily. Patient not taking: Reported on 08/20/2023    [provider]    Physical Exam: Vitals:   08/20/23 1339 08/20/23 1358 08/20/23 1843 08/20/23 1945  BP: 117/64  113/76 (!) 111/58  Pulse: 84  78 75  Resp: 18  (!) 24 15  Temp: 98.2 F (36.8 C)  97.6 F (36.4 C)   TempSrc: Oral  Axillary   SpO2: 93%  93% 97%  Weight:  47.6 kg    Height:  5' (1.524 m)     General: Patient is alert and awake, occasionally makes eye contact however does not talk meaningfully.  Has to be redirected by family frequently.  Patient is distressed/apprehensive.  Especially touching her lower extremities to any extent causes a marked yelling response.  Therefore exam is limited. Respiratory exam: Bilateral intravesicular Cardiovascular exam S1-S2 normal Abdomen all quadrants are soft nontender Extremities: No focal motor deficit is noticed at least of the bilateral upper and left upper extremity.  Right lower extremity no distal deficit is noted as patient is moving it spontaneously.  No marked edema is noted.  Again due to patient apprehension Limited evaluation. Data Reviewed:  Labs on Admission:  Results for orders placed or performed during the hospital encounter of 08/20/23 (from the past 24 hours)  Resp panel by RT-PCR (RSV, Flu A&B, Covid) Anterior Nasal Swab     Status: Abnormal   Collection Time: 08/20/23  1:50 PM   Specimen: Anterior Nasal Swab  Result Value Ref Range   SARS Coronavirus 2 by RT PCR NEGATIVE NEGATIVE   Influenza A by PCR POSITIVE (A) NEGATIVE   Influenza B by PCR NEGATIVE NEGATIVE    Resp Syncytial Virus by PCR NEGATIVE NEGATIVE  CBC with Differential     Status: Abnormal   Collection Time: 08/20/23  3:02 PM  Result Value Ref Range   WBC 13.0 (H) 4.0 - 10.5 K/uL   RBC 4.82 3.87 - 5.11 MIL/uL   Hemoglobin 14.4 12.0 - 15.0 g/dL   HCT 56.3 63.9 - 53.9 %   MCV 90.5 80.0 - 100.0 fL   MCH 29.9 26.0 - 34.0 pg   MCHC 33.0 30.0 - 36.0 g/dL   RDW 86.9 88.4 - 84.4 %   Platelets 335 150 - 400 K/uL   nRBC 0.0 0.0 - 0.2 %  Neutrophils Relative % 82 %   Neutro Abs 10.7 (H) 1.7 - 7.7 K/uL   Lymphocytes Relative 10 %   Lymphs Abs 1.3 0.7 - 4.0 K/uL   Monocytes Relative 7 %   Monocytes Absolute 0.8 0.1 - 1.0 K/uL   Eosinophils Relative 0 %   Eosinophils Absolute 0.0 0.0 - 0.5 K/uL   Basophils Relative 0 %   Basophils Absolute 0.0 0.0 - 0.1 K/uL   Immature Granulocytes 1 %   Abs Immature Granulocytes 0.08 (H) 0.00 - 0.07 K/uL  Comprehensive metabolic panel     Status: Abnormal   Collection Time: 08/20/23  3:02 PM  Result Value Ref Range   Sodium 143 135 - 145 mmol/L   Potassium 3.4 (L) 3.5 - 5.1 mmol/L   Chloride 106 98 - 111 mmol/L   CO2 25 22 - 32 mmol/L   Glucose, Bld 103 (H) 70 - 99 mg/dL   BUN 39 (H) 8 - 23 mg/dL   Creatinine, Ser 8.84 (H) 0.44 - 1.00 mg/dL   Calcium  8.5 (L) 8.9 - 10.3 mg/dL   Total Protein 6.9 6.5 - 8.1 g/dL   Albumin 2.8 (L) 3.5 - 5.0 g/dL   AST 890 (H) 15 - 41 U/L   ALT 53 (H) 0 - 44 U/L   Alkaline Phosphatase 65 38 - 126 U/L   Total Bilirubin 0.7 0.0 - 1.2 mg/dL   GFR, Estimated 48 (L) >60 mL/min   Anion gap 12 5 - 15  Lactic acid, plasma     Status: Abnormal   Collection Time: 08/20/23  3:02 PM  Result Value Ref Range   Lactic Acid, Venous 2.4 (HH) 0.5 - 1.9 mmol/L  Urinalysis, Routine w reflex microscopic -Urine, Catheterized     Status: Abnormal   Collection Time: 08/20/23  6:17 PM  Result Value Ref Range   Color, Urine YELLOW YELLOW   APPearance CLEAR CLEAR   Specific Gravity, Urine 1.021 1.005 - 1.030   pH 5.0 5.0 - 8.0    Glucose, UA NEGATIVE NEGATIVE mg/dL   Hgb urine dipstick MODERATE (A) NEGATIVE   Bilirubin Urine NEGATIVE NEGATIVE   Ketones, ur NEGATIVE NEGATIVE mg/dL   Protein, ur 30 (A) NEGATIVE mg/dL   Nitrite NEGATIVE NEGATIVE   Leukocytes,Ua NEGATIVE NEGATIVE   RBC / HPF 0-5 0 - 5 RBC/hpf   WBC, UA 0-5 0 - 5 WBC/hpf   Bacteria, UA RARE (A) NONE SEEN   Squamous Epithelial / HPF 0-5 0 - 5 /HPF   Mucus PRESENT    Hyaline Casts, UA PRESENT    Basic Metabolic Panel: Recent Labs  Lab 08/20/23 1502  NA 143  K 3.4*  CL 106  CO2 25  GLUCOSE 103*  BUN 39*  CREATININE 1.15*  CALCIUM  8.5*   Liver Function Tests: Recent Labs  Lab 08/20/23 1502  AST 109*  ALT 53*  ALKPHOS 65  BILITOT 0.7  PROT 6.9  ALBUMIN 2.8*   No results for input(s): LIPASE, AMYLASE in the last 168 hours. No results for input(s): AMMONIA in the last 168 hours. CBC: Recent Labs  Lab 08/20/23 1502  WBC 13.0*  NEUTROABS 10.7*  HGB 14.4  HCT 43.6  MCV 90.5  PLT 335   Cardiac Enzymes: No results for input(s): CKTOTAL, CKMB, CKMBINDEX, TROPONINIHS in the last 168 hours.  BNP (last 3 results) No results for input(s): PROBNP in the last 8760 hours. CBG: No results for input(s): GLUCAP in the last 168 hours.  Radiological Exams on  Admission:  CT HEAD WO CONTRAST Result Date: 08/20/2023 CLINICAL DATA:  Head trauma, moderate to severe. Poly trauma, blunt. Patient fell 9 days ago. EXAM: CT HEAD WITHOUT CONTRAST CT CERVICAL SPINE WITHOUT CONTRAST TECHNIQUE: Multidetector CT imaging of the head and cervical spine was performed following the standard protocol without intravenous contrast. Multiplanar CT image reconstructions of the cervical spine were also generated. RADIATION DOSE REDUCTION: This exam was performed according to the departmental dose-optimization program which includes automated exposure control, adjustment of the mA and/or kV according to patient size and/or use of iterative  reconstruction technique. COMPARISON:  MRI brain, CT head, and CT cervical spine 05/12/2023 FINDINGS: CT HEAD FINDINGS Brain: Diffuse cerebral atrophy. Ventricular dilatation consistent with central atrophy. Low-attenuation changes in the deep white matter consistent with small vessel ischemia. No abnormal extra-axial fluid collections. No mass effect or midline shift. Gray-white matter junctions are distinct. Basal cisterns are not effaced. No acute intracranial hemorrhage. Vascular: No hyperdense vessel or unexpected calcification. Skull: Normal. Negative for fracture or focal lesion. Sinuses/Orbits: No acute finding. Other: None. CT CERVICAL SPINE FINDINGS Alignment: Slight anterior subluxations at C3-4 and C4-5 levels and retrolisthesis at C5-6 level are unchanged since prior study, likely degenerative. Skull base and vertebrae: Skull base appears intact. No vertebral compression deformities. No focal bone lesion or bone destruction. Soft tissues and spinal canal: No prevertebral soft tissue swelling. No abnormal paraspinal soft tissue mass or infiltration. Ligamentous calcification at the craniocervical junction. Disc levels: Degenerative changes throughout the cervical spine with narrowed disc spaces and endplate osteophyte formation throughout. Degenerative changes in the cervical facet joints. Upper chest: Lung apices are clear. Other: None. IMPRESSION: 1. No acute intracranial abnormalities. Chronic atrophy and small vessel ischemic changes similar to prior studies. 2. Prominent degenerative changes throughout the cervical spine. No acute displaced fractures are identified. Electronically Signed   By: Elsie Gravely M.D.   On: 08/20/2023 18:21   CT CERVICAL SPINE WO CONTRAST Result Date: 08/20/2023 CLINICAL DATA:  Head trauma, moderate to severe. Poly trauma, blunt. Patient fell 9 days ago. EXAM: CT HEAD WITHOUT CONTRAST CT CERVICAL SPINE WITHOUT CONTRAST TECHNIQUE: Multidetector CT imaging of the head  and cervical spine was performed following the standard protocol without intravenous contrast. Multiplanar CT image reconstructions of the cervical spine were also generated. RADIATION DOSE REDUCTION: This exam was performed according to the departmental dose-optimization program which includes automated exposure control, adjustment of the mA and/or kV according to patient size and/or use of iterative reconstruction technique. COMPARISON:  MRI brain, CT head, and CT cervical spine 05/12/2023 FINDINGS: CT HEAD FINDINGS Brain: Diffuse cerebral atrophy. Ventricular dilatation consistent with central atrophy. Low-attenuation changes in the deep white matter consistent with small vessel ischemia. No abnormal extra-axial fluid collections. No mass effect or midline shift. Gray-white matter junctions are distinct. Basal cisterns are not effaced. No acute intracranial hemorrhage. Vascular: No hyperdense vessel or unexpected calcification. Skull: Normal. Negative for fracture or focal lesion. Sinuses/Orbits: No acute finding. Other: None. CT CERVICAL SPINE FINDINGS Alignment: Slight anterior subluxations at C3-4 and C4-5 levels and retrolisthesis at C5-6 level are unchanged since prior study, likely degenerative. Skull base and vertebrae: Skull base appears intact. No vertebral compression deformities. No focal bone lesion or bone destruction. Soft tissues and spinal canal: No prevertebral soft tissue swelling. No abnormal paraspinal soft tissue mass or infiltration. Ligamentous calcification at the craniocervical junction. Disc levels: Degenerative changes throughout the cervical spine with narrowed disc spaces and endplate osteophyte formation throughout. Degenerative changes in  the cervical facet joints. Upper chest: Lung apices are clear. Other: None. IMPRESSION: 1. No acute intracranial abnormalities. Chronic atrophy and small vessel ischemic changes similar to prior studies. 2. Prominent degenerative changes throughout  the cervical spine. No acute displaced fractures are identified. Electronically Signed   By: Elsie Gravely M.D.   On: 08/20/2023 18:21   CT CHEST ABDOMEN PELVIS WO CONTRAST Result Date: 08/20/2023 CLINICAL DATA:  Weakness, trauma, cough and congestion, malodorous urine, fall 9 days ago. Advanced dementia. EXAM: CT CHEST, ABDOMEN AND PELVIS WITHOUT CONTRAST TECHNIQUE: Multidetector CT imaging of the chest, abdomen and pelvis was performed following the standard protocol without IV contrast. RADIATION DOSE REDUCTION: This exam was performed according to the departmental dose-optimization program which includes automated exposure control, adjustment of the mA and/or kV according to patient size and/or use of iterative reconstruction technique. COMPARISON:  Multiple exams, including abdominal CT 12/25/2009 and thoracic spine CT 05/12/2023 FINDINGS: CT CHEST FINDINGS Cardiovascular: Atheromatous vascular calcification of the aortic arch. Mediastinum/Nodes: Unremarkable Lungs/Pleura: Mild biapical pleuroparenchymal scarring. Airway thickening is present, suggesting bronchitis or reactive airways disease. Layering mucus in the right mainstem bronchus. Nodular and branching opacities in the left upper lobe favoring atypical infectious bronchiolitis given the clustered appearance, largest individual nodule 0.8 by 0.6 cm on image 62 series 5. Musculoskeletal: Degenerative glenohumeral arthropathy bilaterally. CT ABDOMEN PELVIS FINDINGS Hepatobiliary: Unremarkable Pancreas: Unremarkable Spleen: Unremarkable Adrenals/Urinary Tract: Bosniak category 1 cyst of the right kidney lower pole laterally on image 69 series 3. Similar lesion in the left kidney upper pole. No further imaging workup of these lesions is indicated. Adrenal glands in urinary bladder unremarkable. Stomach/Bowel: Unremarkable Vascular/Lymphatic: Atherosclerosis is present, including aortoiliac atherosclerotic disease. Reproductive: 2.6 cm calcified uterine  fibroid. Otherwise unremarkable. Other: No supplemental non-categorized findings. Musculoskeletal: Subacute fracture of the RIGHT femoral neck is substantially displaced and demonstrates varus angulation. The left hip is substantially flexed and adducted but I do not see a fracture of the left proximal femur/hip or of the left hemipelvis. Interbody fusion at L1-2 and at L3-L4-L5 with posterolateral rod and pedicle screw fixation at L4-5. No acute lumbar spine findings. IMPRESSION: 1. Subacute fracture of the RIGHT femoral neck is substantially displaced and demonstrates varus angulation. 2. Nodular and branching opacities in the left upper lobe favoring atypical infectious bronchiolitis given the clustered appearance, largest individual nodule 0.8 by 0.6 cm. 3. Airway thickening is present, suggesting bronchitis or reactive airways disease. Layering mucus in the right mainstem bronchus. 4. Degenerative glenohumeral arthropathy bilaterally. 5. 2.6 cm calcified uterine fibroid. 6.  Aortic Atherosclerosis (ICD10-I70.0). Electronically Signed   By: Ryan Salvage M.D.   On: 08/20/2023 18:00   DG FEMUR PORT MIN 2 VIEWS LEFT Result Date: 08/20/2023 CLINICAL DATA:  Left hip pain EXAM: LEFT FEMUR PORTABLE 1 VIEW COMPARISON:  Pelvic radiograph from [facility] dated 08/13/2023 FINDINGS: There is a right femoral neck fracture. Due to difficulties with patient cooperation and discomfort, a single nonstandard oblique projection is obtained on 2 images. I do not see a definite left femoral fracture on this single view. There is a left total knee prosthesis observed. The left hip is held with prominent adduction. IMPRESSION: 1. Right (contralateral) femoral neck fracture. 2. No definite left femoral fracture on this single view. 3. Left total knee prosthesis. 4. Prominent adduction of the left hip. Electronically Signed   By: Ryan Salvage M.D.   On: 08/20/2023 17:50   DG Chest 1 View Result Date:  08/20/2023 CLINICAL DATA:  Pneumonia,  left hip pain EXAM: CHEST  1 VIEW COMPARISON:  05/12/2023 FINDINGS: Subtle left suprahilar nodularity, for further characterization on the patient's CT scan. The lungs appear otherwise clear. Cardiac and mediastinal margins appear normal. No blunting of the costophrenic angles. Degenerative glenohumeral arthropathy is present bilaterally. IMPRESSION: 1. Subtle left suprahilar nodularity, for further characterization on the patient's chest CT scan. 2. Degenerative glenohumeral arthropathy bilaterally. Electronically Signed   By: Ryan Salvage M.D.   On: 08/20/2023 17:46    chest X-ray   I/O last 3 completed shifts: In: -  Out: 350 [Urine:350] No intake/output data recorded.       Assessment and Plan: * Femoral fracture (HCC) Please see HPI, patient found to have acute/subacute right femoral neck fracture subacute occult with angulation.  Closed.  Ortho has been engaged.  For pain medication I will order standing acetaminophen  as well as OxyContin .  Bowel regimen has been ordered.  I will also order as needed Dilaudid  for comfort.  Given patient's marked apprehension, concern for missing further occult fractures.  Therefore I will order bilateral tib-fib x-rays.  Patient was able to be reevaluated afterwards and it does not look like the foot has any fracture..  Look forward to orthopedic evaluation in this regard.  DVT prophylaxis with Lovenox  ordered every evening.  Influenza A Husband reports patient has been coughing with for about a week.  Patient is noted to have leukocytosis as well as elevated lactic acidosis here.  On the CAT scan.  I reviewed the images myself.  Does not look like the patient has a pneumonia, although she does have some left upper lobe branching opacity and nodular fashion.  Which may be infectious bronchiolitis.  Will treat with Unasyn  at this time, patient is outside the window to start treatment with oseltamivir .  Monitor  clinically.  Will check swallow evaluation  CKD (chronic kidney disease) Baseline GFR seems to be just below 60 based on labs in 3 months ago.  Patient current creatinine is 1.15 with BUN of 39.  I believe patient may be dehydrated from her right femoral neck fracture as well as some ongoing pain.  Patient received 1 L of saline in the ER.  I will order another 1 L to be infused over the next 10 hours.  We will trend the creatinine.  Potassium will be really repleted orally.  Elevated LFTs Mild new. Given history, check CK first. Ok to give acetaminophen . Check inr ptt. Trend.  Dementia with behavioral disturbance (HCC) Behavioral disturbance seems to be new in the last 1 week and I would attribute largely to pain.  Therefore standing OxyContin  and acetaminophen  has been ordered.  Also some Dilaudid .  However it is possible that the patient's behavioral disturbance may interfere with medical therapies necessary for the patient.  Therefore I will order as needed haloperidol  when patient is not able to be redirected by family.  Seroquel  for insomnia X 48 hours.      Advance Care Planning:   Code Status: Full Code discussed with family. Patint was placed on hospice this week. However  , it seems this was done in the setting of a missed femoral neck fracture diagnosis. Due to events of the week. Very uncertain of code status - do not want to miss out anything. Author was receptive to family/patient dilemma. When asked, shard that in case of CPR being necessary, patient likelihood of neurologically intact discharge is about 3-15 (? ~10%) based on GO-Far calculation. Further advised that this  question is routinely brought up for all admission by author in advance to respect patient wishes as a potential code situation does not allow for time to consent the POA contemporaneously or engage in a discussion at that time. Addendum  at 1159 PM - patient is having multisystem pathologies/exacerbation.  Therefore, it would be good to periodically revisit goals of care with family.  Consults: orthopedics.  Family Communication: as above. Shared reports of imaging in print with husband and daughter at bedside per request.  Severity of Illness: The appropriate patient status for this patient is INPATIENT. Inpatient status is judged to be reasonable and necessary in order to provide the required intensity of service to ensure the patient's safety. The patient's presenting symptoms, physical exam findings, and initial radiographic and laboratory data in the context of their chronic comorbidities is felt to place them at high risk for further clinical deterioration. Furthermore, it is not anticipated that the patient will be medically stable for discharge from the hospital within 2 midnights of admission.   * I certify that at the point of admission it is my clinical judgment that the patient will require inpatient hospital care spanning beyond 2 midnights from the point of admission due to high intensity of service, high risk for further deterioration and high frequency of surveillance required.*  Author: Jacqulyn Divine, MD 08/20/2023 8:51 PM  For on call review [email or web address].

## 2023-08-20 NOTE — Assessment & Plan Note (Addendum)
 On admission, Baseline GFR seems to be just below 60 based on labs in 3 months ago.  Patient current creatinine is 1.15 with BUN of 39.  I believe patient may be dehydrated from her right femoral neck fracture as well as some ongoing pain.  Patient received 1 L of saline in the ER.  I will order another 1 L to be infused over the next 10 hours.  We will trend the creatinine.  Potassium will be really repleted orally.  08-27-2023 stable.

## 2023-08-20 NOTE — Progress Notes (Signed)
 Patient with right femoral neck fracture, dementia, active influenza.    May benefit from palliative hip hemiarthroplasty once optimized medically.   Full consult to follow, will discuss with medical team and family regarding wishes.    Fonda SHAUNNA Olmsted, MD

## 2023-08-20 NOTE — Assessment & Plan Note (Addendum)
On admission through 08-27-2023.  Mild new. Given history, check CK first. Ok to give acetaminophen. Check inr ptt. Trend.  08-27-2023 AST 44 , ALT 29. Stable.

## 2023-08-21 ENCOUNTER — Inpatient Hospital Stay (HOSPITAL_COMMUNITY): Payer: PPO

## 2023-08-21 DIAGNOSIS — I213 ST elevation (STEMI) myocardial infarction of unspecified site: Secondary | ICD-10-CM

## 2023-08-21 DIAGNOSIS — S72001A Fracture of unspecified part of neck of right femur, initial encounter for closed fracture: Secondary | ICD-10-CM | POA: Diagnosis not present

## 2023-08-21 DIAGNOSIS — F03918 Unspecified dementia, unspecified severity, with other behavioral disturbance: Secondary | ICD-10-CM

## 2023-08-21 LAB — BASIC METABOLIC PANEL WITH GFR
Anion gap: 13 (ref 5–15)
BUN: 32 mg/dL — ABNORMAL HIGH (ref 8–23)
CO2: 19 mmol/L — ABNORMAL LOW (ref 22–32)
Calcium: 8.1 mg/dL — ABNORMAL LOW (ref 8.9–10.3)
Chloride: 112 mmol/L — ABNORMAL HIGH (ref 98–111)
Creatinine, Ser: 0.92 mg/dL (ref 0.44–1.00)
GFR, Estimated: >60 mL/min
Glucose, Bld: 87 mg/dL (ref 70–99)
Potassium: 4.7 mmol/L (ref 3.5–5.1)
Sodium: 144 mmol/L (ref 135–145)

## 2023-08-21 LAB — HEPATIC FUNCTION PANEL
ALT: 50 U/L — ABNORMAL HIGH (ref 0–44)
AST: 101 U/L — ABNORMAL HIGH (ref 15–41)
Albumin: 2.6 g/dL — ABNORMAL LOW (ref 3.5–5.0)
Alkaline Phosphatase: 61 U/L (ref 38–126)
Bilirubin, Direct: 0.3 mg/dL — ABNORMAL HIGH (ref 0.0–0.2)
Indirect Bilirubin: 0.6 mg/dL (ref 0.3–0.9)
Total Bilirubin: 0.9 mg/dL (ref 0.0–1.2)
Total Protein: 6.6 g/dL (ref 6.5–8.1)

## 2023-08-21 LAB — LIPID PANEL
Cholesterol: 129 mg/dL (ref 0–200)
HDL: 19 mg/dL — ABNORMAL LOW (ref >40)
LDL Cholesterol: 84 mg/dL (ref 0–99)
Total CHOL/HDL Ratio: 6.8 {ratio}
Triglycerides: 129 mg/dL (ref <150)
VLDL: 26 mg/dL (ref 0–40)

## 2023-08-21 LAB — TROPONIN I (HIGH SENSITIVITY): Troponin I (High Sensitivity): 837 ng/L (ref <18)

## 2023-08-21 LAB — CBC
HCT: 43.4 % (ref 36.0–46.0)
Hemoglobin: 13.7 g/dL (ref 12.0–15.0)
MCH: 29.6 pg (ref 26.0–34.0)
MCHC: 31.6 g/dL (ref 30.0–36.0)
MCV: 93.7 fL (ref 80.0–100.0)
Platelets: 304 K/uL (ref 150–400)
RBC: 4.63 MIL/uL (ref 3.87–5.11)
RDW: 13.1 % (ref 11.5–15.5)
WBC: 12 K/uL — ABNORMAL HIGH (ref 4.0–10.5)
nRBC: 0 % (ref 0.0–0.2)

## 2023-08-21 LAB — C-REACTIVE PROTEIN: CRP: 10.8 mg/dL — ABNORMAL HIGH (ref <1.0)

## 2023-08-21 LAB — BRAIN NATRIURETIC PEPTIDE: B Natriuretic Peptide: 103.9 pg/mL — ABNORMAL HIGH (ref 0.0–100.0)

## 2023-08-21 LAB — CK: Total CK: 2414 U/L — ABNORMAL HIGH (ref 38–234)

## 2023-08-21 LAB — SEDIMENTATION RATE: Sed Rate: 34 mm/h — ABNORMAL HIGH (ref 0–22)

## 2023-08-21 MED ORDER — POVIDONE-IODINE 10 % EX SWAB: 2.0000 | Freq: Once | CUTANEOUS | Status: DC

## 2023-08-21 MED ORDER — CHLORHEXIDINE GLUCONATE 4 % EX SOLN: 60.0000 mL | Freq: Once | CUTANEOUS | Status: DC

## 2023-08-21 MED ORDER — LACTATED RINGERS IV SOLN: INTRAVENOUS | Status: AC

## 2023-08-21 MED ORDER — ASPIRIN 300 MG RE SUPP
300.0000 mg | Freq: Once | RECTAL | Status: AC
  Administered 2023-08-21: 300 mg via RECTAL
  Filled 2023-08-21: qty 1

## 2023-08-21 MED ORDER — LACTATED RINGERS IV SOLN: INTRAVENOUS | Status: DC

## 2023-08-21 MED ORDER — TRANEXAMIC ACID-NACL 1000-0.7 MG/100ML-% IV SOLN
1000.0000 mg | INTRAVENOUS | Status: AC
  Administered 2023-08-22: 1000 mg via INTRAVENOUS
  Filled 2023-08-21: qty 100

## 2023-08-21 MED ORDER — ASPIRIN 325 MG PO TABS: 325.0000 mg | ORAL_TABLET | Freq: Once | ORAL | Status: AC

## 2023-08-21 MED ORDER — CEFAZOLIN SODIUM-DEXTROSE 2-4 GM/100ML-% IV SOLN
2.0000 g | INTRAVENOUS | Status: AC
  Administered 2023-08-22: 2 g via INTRAVENOUS
  Filled 2023-08-21: qty 100

## 2023-08-21 MED ORDER — HEPARIN (PORCINE) 25000 UT/250ML-% IV SOLN
550.0000 [IU]/h | INTRAVENOUS | Status: DC
  Administered 2023-08-21: 550 [IU]/h via INTRAVENOUS
  Filled 2023-08-21: qty 250

## 2023-08-21 NOTE — Hospital Course (Addendum)
 [name] is a 78 y.o. female with medical history significant of dementia, CKD presented to the hospital after a fall that was a week back when she had gone to urgent care center and since then she has not been mobile.  She was also having cough for a week and had sick contact with her husband.  At home at some point hospice was engaged but then family wished to have second opinion in the ED.  In the ED, CT scan of the abdomen and pelvis was done which revealed right femoral neck fracture.  Family rescinded DO NOT RESUSCITATE order and Ortho was consulted.  Assessment and plan   Femoral fracture with history of recurrent falls. CT scan of the abdomen pelvis with acute/subacute right femoral neck fracture subacute occult with angulation.  Orthopedics has been consulted and plan for palliative hip hemiarthroplasty if with as per the family..  Continue pain medication regimen.  Sed rate elevated at 34.  Elevated CK levels.  Continue with hydration.  Monitor closely.  Elevated troponins.  Seen by cardiology.  Possible myocarditis from influenza A.  On heparin .  Check 2D echocardiogram.   Influenza A Mild leukocytosis and elevated lactate on presentation..  CT scan with  possible left upper lobe opacity and nodular fashion possible infectious bronchiolitis.  Outside the window for Tamiflu .  Empirically on Unasyn  for possible pneumonia..  O check a speech and swallow.  Blood cultures negative in less than 12 hours.  Continue with hydration.  CKD (chronic kidney disease) Initial creatinine of 1.15 with BUN of 39.   received IV fluids.  Elevated LFTs Likely secondary to elevated CK levels.  Continue to monitor.   Dementia with behavioral disturbance  Behavioral disturbance with new for 1 week likely secondary to pain.  Cocci Contin and Tylenol  with some Dilaudid .  Might need Haldol  for agitation.  Delirium precautions.  Might benefit from ongoing discussion about goals of care.   HPI: [name] is a 78 y.o. female with medical history significant of dementia.  Till about a week ago patient was ambulatory as per husband son and daughter at the bedside.  History principally from the patient's family as well as from ER signout and record review.  Apparently patient had a fall about a week ago for which patient was seen at an urgent care facility.  She had an x-ray of her?  Hips done.  Apparently the x-rays were negative and patient was discharged home.  Unfortunately patient did not get out of bed since then.  Patient has been cared for in the bed by family since then.  At some point hospice was engaged and family understood that patient was going to pass away soon with comfort measures.  Daughter decided to insist that the patient get a second opinion at the ER and therefore presents here.   ER course is notable for patient being found to be markedly apprehensive, occasionally yelling - pretty much with any touch below the pelvis.  As per family this has been the case for at least a week.  Also patient had a CAT scan done of the abdomen pelvis.  Which revealed a right femoral neck fracture.  Family has RECINDED DO NOT RESUSCITATE order.  Ortho has been engaged.  Medical evaluation is sought.   Unfortunately history from the patient cannot be obtained.  Per family she was talkative when she came to the ER but since getting the Versed  and fentanyl  she has not been talking.  Patinet has had a cough wet X 1 week. Sick contact with husband. No fver, no tachypnea.  Significant Events: Admitted 08/20/2023 for right femoral neck fracture  Significant Labs: Influenza A POSITIVE WBC 13, HgB 14.4, Plt 335 Na 143, K 3.4, CO2 of 25, BUN 39, Scr 1.15, glu 103 AST 109, ALT 53, TP 6.9, alb 2.8  Significant Imaging Studies: CT head/c-spine  No acute intracranial abnormalities. Chronic atrophy and small vessel ischemic changes similar to prior studies. 2. Prominent degenerative changes throughout the  cervical spine. No acute displaced fractures are identified. CT chest/abd/pelvis Subacute fracture of the RIGHT femoral neck is substantially displaced and  demonstrates varus angulation. 2. Nodular and branching opacities in the left upper lobe favoring atypical infectious bronchiolitis given the clustered appearance, largest individual nodule 0.8 by 0.6 cm. 3. Airway thickening is present, suggesting bronchitis or reactive airways disease. Layering mucus in the right mainstem bronchus. 4. Degenerative  glenohumeral arthropathy bilaterally. 5. 2.6 cm calcified uterine fibroid. 6.  Aortic Atherosclerosis   Antibiotic Therapy: Anti-infectives (From admission, onward)    Start     Dose/Rate Route Frequency Ordered Stop   08/22/23 2100  ceFAZolin  (ANCEF ) IVPB 2g/100 mL premix        2 g 200 mL/hr over 30 Minutes Intravenous Every 8 hours 08/22/23 1613 08/24/23 0700   08/22/23 0800  Ampicillin -Sulbactam (UNASYN ) 3 g in sodium chloride  0.9 % 100 mL IVPB        3 g 200 mL/hr over 30 Minutes Intravenous Every 8 hours 08/22/23 0732 08/27/23 1445   08/22/23 0600  ceFAZolin  (ANCEF ) IVPB 2g/100 mL premix        2 g 200 mL/hr over 30 Minutes Intravenous On call to O.R. 08/21/23 1841 08/22/23 1316   08/20/23 2300  oseltamivir  (TAMIFLU ) capsule 30 mg        30 mg Oral 2 times daily 08/20/23 2250 08/25/23 2159   08/20/23 2200  Ampicillin -Sulbactam (UNASYN ) 3 g in sodium chloride  0.9 % 100 mL IVPB  Status:  Discontinued        3 g 200 mL/hr over 30 Minutes Intravenous Every 12 hours 08/20/23 2159 08/22/23 0732       Procedures: 08-22-2023 right hip hemiarthroplasty  Consultants: Ortho Cardiology Palliative Care

## 2023-08-21 NOTE — Progress Notes (Signed)
 PHARMACY - ANTICOAGULATION CONSULT NOTE  Pharmacy Consult for Heparin   Indication:  elevated troponin  Allergies  Allergen Reactions   Aricept  [Donepezil ] Diarrhea   Sulfa Antibiotics Hives   Wellbutrin [Bupropion] Other (See Comments)    Body aches     Patient Measurements: Height: 5' (152.4 cm) Weight: 47.6 kg (105 lb) IBW/kg (Calculated) : 45.5  Vital Signs: Temp: 97.8 F (36.6 C) (02/06 0119) Temp Source: Axillary (02/06 0119) BP: 132/74 (02/06 0100) Pulse Rate: 86 (02/06 0100)  Labs: Recent Labs    08/20/23 1502 08/20/23 2026  HGB 14.4 13.7  HCT 43.6 42.9  PLT 335 240  APTT  --  26  LABPROT  --  14.3  INR  --  1.1  CREATININE 1.15* 0.91  CKTOTAL  --  2,808*  TROPONINIHS  --  1,155*    Estimated Creatinine Clearance: 36 mL/min (by C-G formula based on SCr of 0.91 mg/dL).   Medical History: Past Medical History:  Diagnosis Date   Anxiety    Arthritis    lower back & knees   Back pain    Cataracts, bilateral    Depression    takes Citalopram  daily   Encephalitis 1973   Headache(784.0)    occasionally from a fall a month ago   Hearing loss    History of blood transfusion    no abnormal reaction noted   Hypertension    Insomnia    Joint pain    Joint swelling    Memory loss    Nocturia    Osteoarthritis    Rosacea      Assessment: 77 y/o F presents to the ED with cold symptoms x 1 week and increasing weakness. Troponin is elevated. Cardiology was consulted and has ordered heparin  for now. Above labs reviewed. PTA meds reviewed.   Goal of Therapy:  Heparin  level 0.3-0.7 units/ml Monitor platelets by anticoagulation protocol: Yes   Plan:  Start heparin  at 550 units/hr 8 hour heparin  level Daily CBC and heparin  level Monitor for bleeding F/U additional cardiology recommendations   Lynwood Mckusick, PharmD, BCPS Clinical Pharmacist Phone: [phone]

## 2023-08-21 NOTE — ED Notes (Signed)
 MD called to bed side for patient snoring,breathing and GCS. Cont POC

## 2023-08-21 NOTE — Progress Notes (Signed)
 Consult note reviewed from earlier this morning.  78 year old with end-stage Alzheimer's dementia recurrent falls with right femur fracture and elevated troponin of 1000, nonverbal.  Her troponin elevation is myocardial injury in the setting of recent femur fracture.  We will go ahead and discontinue IV heparin  as this does not likely represent acute coronary syndrome.  Echocardiogram has been ordered.  Has received aspirin .  If surgical options seems necessary, she may proceed with moderate to high overall cardiac risk.  Reportedly ambulatory last week but health has been declining.  She is at increased risk for future morbidity mortality.  Continue to treat underlying flu.  We will go ahead and sign off.  Please let us  know we can be of further assistance.  Oneil Parchment, MD

## 2023-08-21 NOTE — ED Notes (Signed)
 Hospitalist notified that pt is not able to swallow pills due mental status.

## 2023-08-21 NOTE — ED Notes (Signed)
 Bladder scan performed d/t no urine output in purewick canister in several hours. 216 mL. MD notified, OK to wait for

## 2023-08-21 NOTE — Anesthesia Preprocedure Evaluation (Addendum)
 Anesthesia Evaluation  Patient identified by MRN, date of birth, ID band Patient confused    Reviewed: Allergy & Precautions, NPO status , Patient's Chart, lab work & pertinent test results  Airway Mallampati: II  TM Distance: >3 FB Neck ROM: Full    Dental no notable dental hx. (+) Teeth Intact, Dental Advisory Given   Pulmonary  Influenza A   Pulmonary exam normal breath sounds clear to auscultation       Cardiovascular hypertension, Normal cardiovascular exam Rhythm:Regular Rate:Normal  08/22/2023 TTE 1. Left ventricular ejection fraction, by estimation, is 60 to 65%. The  left ventricle has normal function. The left ventricle has no regional  wall motion abnormalities. Left ventricular diastolic parameters were  normal.   2. Right ventricular systolic function is normal. The right ventricular  size is normal.   3. The mitral valve is normal in structure. Mild mitral valve  regurgitation. No evidence of mitral stenosis.   4. The aortic valve is normal in structure. Aortic valve regurgitation is  mild. No aortic stenosis is present.   5. The inferior vena cava is normal in size with greater than 50%  respiratory variability, suggesting right atrial pressure of 3 mmHg.     Neuro/Psych  Headaches PSYCHIATRIC DISORDERS Anxiety Depression   Dementia  Head trauma, moderate to severe. Poly trauma, blunt. Patient fell 9 days ago. EXAM: CT HEAD WITHOUT CONTRAST CT CERVICAL SPINE WITHOUT CONTRAST TECHNIQUE:    GI/Hepatic negative GI ROS, Neg liver ROS,,,  Endo/Other    Renal/GU Renal InsufficiencyRenal diseaseLab Results      Component                Value               Date                       K                        4.7                 08/21/2023                 CREATININE               0.92                08/21/2023                 ALBUMIN                  2.6 (L)             08/21/2023                GLUCOSE                   87                  08/21/2023                Musculoskeletal  (+) Arthritis ,    Abdominal   Peds  Hematology Lab Results      Component                Value               Date  WBC                      12.0 (H)            08/21/2023                HGB                      13.7                08/21/2023                HCT                      43.4                08/21/2023                MCV                      93.7                08/21/2023                PLT                      304                 08/21/2023              Anesthesia Other Findings   Reproductive/Obstetrics                             Anesthesia Physical Anesthesia Plan  ASA: 3  Anesthesia Plan: Spinal   Post-op Pain Management: Regional block*   Induction:   PONV Risk Score and Plan: Treatment may vary due to age or medical condition  Airway Management Planned: Nasal Cannula and Simple Face Mask  Additional Equipment: None  Intra-op Plan:   Post-operative Plan: Extubation in OR  Informed Consent: I have reviewed the patients History and Physical, chart, labs and discussed the procedure including the risks, benefits and alternatives for the proposed anesthesia with the patient or authorized representative who has indicated his/her understanding and acceptance.     Dental advisory given  Plan Discussed with: CRNA and Anesthesiologist  Anesthesia Plan Comments:         Anesthesia Quick Evaluation

## 2023-08-21 NOTE — ED Notes (Signed)
 Pt very agitated and pulling off leads. And attempting to pull out IV

## 2023-08-21 NOTE — Progress Notes (Addendum)
 PROGRESS NOTE  Rai Sinagra FMW:[id number] DOB: Jul 01, 1946 DOA: 08/20/2023 PCP: Arloa Elsie SAUNDERS, MD   LOS: 1 day   Brief narrative:  Lorean Ekstrand is a 78 y.o. female with medical history significant of dementia, CKD presented to the hospital after a fall that was a week back when she had gone to urgent care center and since then she has not been mobile.  She was also having cough for a week and had sick contact with her husband.  At home at some point hospice was engaged but then family wished to have second opinion in the ED.  In the ED, CT scan of the abdomen and pelvis was done which revealed right femoral neck fracture.  Family rescinded DO NOT RESUSCITATE order and Ortho was consulted.     Assessment/Plan: Principal Problem:   Femoral fracture (HCC) Active Problems:   Dementia with behavioral disturbance (HCC)   Elevated LFTs   CKD (chronic kidney disease)   Influenza A  Right femoral neck fracture with history of recurrent falls. CT scan of the abdomen pelvis with acute/subacute right femoral neck fracture subacute occult with angulation.  Orthopedics has been consulted and plan for palliative hip hemiarthroplasty. Continue pain medication regimen.  Sed rate elevated at 34.  Patient's family wishes to undergo surgical intervention.  Communicated with orthopedics and wishes to be at [facility] for potential surgery tomorrow.  Will put in a transfer order for [facility] [facility]  Elevated CK levels.  Continue with hydration.  Monitor closely.  Elevated troponins.  Seen by cardiology.  Troponin elevation up to 1K.  Possible myocarditis from influenza A.  Heparin  drip has been discontinued by cardiology.  Check 2D echocardiogram for wall motion abnormality.  Received aspirin  as well..  Patient has moderate to high cardiac risk undergo surgery..   Influenza A Mild leukocytosis and elevated lactate on presentation..  CT scan with  possible left upper lobe opacity and nodular  fashion possible infectious bronchiolitis.  Outside the window for Tamiflu .  Empirically on Unasyn  for possible pneumonia..  Will check check a speech and swallow when able..  Blood cultures negative in less than 12 hours.  Continue with hydration for now.  CKD (chronic kidney disease) Initial creatinine of 1.15 with BUN of 39.  Monitor closely.  Continue IV fluids for today.  Elevated LFTs Likely secondary to elevated CK levels.  Continue to monitor.   Advanced dementia with behavioral disturbance  Behavioral disturbance with new for 1 week likely secondary to pain.  Cocci Contin and Tylenol  with some Dilaudid .  Might need Haldol  for agitation.  Delirium precautions.  Might benefit from ongoing discussion about goals of care.  Goals of care.  Patient appears to be in distress at this time and had received narcotics.  Appears to be high risk for surgery in the context of influenza, advanced dementia and elevated troponin.  T Discussed about goals of care with the patient's son at bedside.  Spoke with the nature of her problems and risk of deterioration while in the hospital.  He is going to talk with the patient and the family about it.  For now, they wish to proceed with palliative treatment and surgery.  DVT prophylaxis: SCDs Start: 08/20/23 2026   Disposition: Uncertain at this time.  Status is: Inpatient  Remains inpatient appropriate because: Need for hip surgery, pending medical optimization,    Code Status:     Code Status: Full Code  Family Communication: Spoke with the patient's son  at bedside at length.  Consultants: Orthopedics Cardiology  Procedures: None yet  Anti-infectives:  Tamiflu  Unasyn   Anti-infectives (From admission, onward)    Start     Dose/Rate Route Frequency Ordered Stop   08/20/23 2300  oseltamivir  (TAMIFLU ) capsule 30 mg        30 mg Oral 2 times daily 08/20/23 2250 08/25/23 2159   08/20/23 2200  Ampicillin -Sulbactam (UNASYN ) 3 g in sodium  chloride 0.9 % 100 mL IVPB        3 g 200 mL/hr over 30 Minutes Intravenous Every 12 hours 08/20/23 2159         Subjective: Today, patient was seen and examined at bedside.  Appears to be a snorer, has baseline dementia but able to recognize family.  Patient's son stated that she was ambulating a week back.  Received narcotic with better pain relief at this time.  Unable to communicate  Objective: Vitals:   08/21/23 0519 08/21/23 1153  BP:  (!) 143/60  Pulse:  79  Resp:  13  Temp: 97.8 F (36.6 C)   SpO2:  97%    Intake/Output Summary (Last 24 hours) at 08/21/2023 1159 Last data filed at 08/21/2023 1104 Gross per 24 hour  Intake 40.7 ml  Output 350 ml  Net -309.3 ml   Filed Weights   08/20/23 1358  Weight: 47.6 kg   Body mass index is 20.51 kg/m.   Physical Exam:  GENERAL: Patient is somnolent and snoring, nonverbal,. Not in obvious distress.,  On nasal cannula oxygen, appears chronically ill deconditioned, thinly built HENT: No scleral pallor or icterus. Pupils equally reactive to light. Oral mucosa is dry NECK: is supple, no gross swelling noted. CHEST: Clear to auscultation. CVS: S1 and S2 heard, no murmur. Regular rate and rhythm.  ABDOMEN: Soft, non-tender, bowel sounds are present. EXTREMITIES: No edema. CNS: Moves extremities. SKIN: warm and dry without rashes.  Decreased skin turgor.  Data Review: I have personally reviewed the following laboratory data and studies,  CBC: Recent Labs  Lab 08/20/23 1502 08/20/23 2026 08/21/23 0300  WBC 13.0* 9.6 12.0*  NEUTROABS 10.7*  --   --   HGB 14.4 13.7 13.7  HCT 43.6 42.9 43.4  MCV 90.5 92.7 93.7  PLT 335 240 304   Basic Metabolic Panel: Recent Labs  Lab 08/20/23 1502 08/20/23 2026 08/21/23 0300  NA 143  --  144  K 3.4*  --  4.7  CL 106  --  112*  CO2 25  --  19*  GLUCOSE 103*  --  87  BUN 39*  --  32*  CREATININE 1.15* 0.91 0.92  CALCIUM  8.5*  --  8.1*   Liver Function Tests: Recent Labs  Lab  08/20/23 1502 08/21/23 0300  AST 109* 101*  ALT 53* 50*  ALKPHOS 65 61  BILITOT 0.7 0.9  PROT 6.9 6.6  ALBUMIN 2.8* 2.6*   No results for input(s): LIPASE, AMYLASE in the last 168 hours. No results for input(s): AMMONIA in the last 168 hours. Cardiac Enzymes: Recent Labs  Lab 08/20/23 2026 08/21/23 0300  CKTOTAL 2,808* 2,414*   BNP (last 3 results) Recent Labs    08/21/23 0125  BNP 103.9*    ProBNP (last 3 results) No results for input(s): PROBNP in the last 8760 hours.  CBG: No results for input(s): GLUCAP in the last 168 hours. Recent Results (from the past 240 hours)  Resp panel by RT-PCR (RSV, Flu A&B, Covid) Anterior Nasal Swab  Status: Abnormal   Collection Time: 08/20/23  1:50 PM   Specimen: Anterior Nasal Swab  Result Value Ref Range Status   SARS Coronavirus 2 by RT PCR NEGATIVE NEGATIVE Final   Influenza A by PCR POSITIVE (A) NEGATIVE Final   Influenza B by PCR NEGATIVE NEGATIVE Final    Comment: (NOTE) The Xpert Xpress SARS-CoV-2/FLU/RSV plus assay is intended as an aid in the diagnosis of influenza from Nasopharyngeal swab specimens and should not be used as a sole basis for treatment. Nasal washings and aspirates are unacceptable for Xpert Xpress SARS-CoV-2/FLU/RSV testing.  Fact Sheet for Patients: bloggercourse.com  Fact Sheet for Healthcare Providers: seriousbroker.it  This test is not yet approved or cleared by the [place]  FDA and has been authorized for detection and/or diagnosis of SARS-CoV-2 by FDA under an Emergency Use Authorization (EUA). This EUA will remain in effect (meaning this test can be used) for the duration of the COVID-19 declaration under Section 564(b)(1) of the Act, 21 U.S.C. section 360bbb-3(b)(1), unless the authorization is terminated or revoked.     Resp Syncytial Virus by PCR NEGATIVE NEGATIVE Final    Comment: (NOTE) Fact Sheet for  Patients: bloggercourse.com  Fact Sheet for Healthcare Providers: seriousbroker.it  This test is not yet approved or cleared by the [place]  FDA and has been authorized for detection and/or diagnosis of SARS-CoV-2 by FDA under an Emergency Use Authorization (EUA). This EUA will remain in effect (meaning this test can be used) for the duration of the COVID-19 declaration under Section 564(b)(1) of the Act, 21 U.S.C. section 360bbb-3(b)(1), unless the authorization is terminated or revoked.  Performed at [facility], [address], [place], [place] [address]   Culture, blood (Routine X 2) w Reflex to ID Panel     Status: None (Preliminary result)   Collection Time: 08/20/23 11:00 PM   Specimen: BLOOD LEFT ARM  Result Value Ref Range Status   Specimen Description BLOOD LEFT ARM  Final   Special Requests   Final    BOTTLES DRAWN AEROBIC AND ANAEROBIC Blood Culture results may not be optimal due to an inadequate volume of blood received in culture bottles   Culture   Final    NO GROWTH < 12 HOURS Performed at [facility], [address] [place], [place] [address]    Report Status PENDING  Incomplete  Culture, blood (Routine X 2) w Reflex to ID Panel     Status: None (Preliminary result)   Collection Time: 08/20/23 11:00 PM   Specimen: BLOOD RIGHT ARM  Result Value Ref Range Status   Specimen Description BLOOD RIGHT ARM  Final   Special Requests   Final    BOTTLES DRAWN AEROBIC AND ANAEROBIC Blood Culture results may not be optimal due to an inadequate volume of blood received in culture bottles   Culture   Final    NO GROWTH < 12 HOURS Performed at [facility], [address], [place], [place] [address]    Report Status PENDING  Incomplete     Studies: DG Tibia/Fibula Left Port Result Date: 08/20/2023 CLINICAL DATA:  Lower leg pain after a fall with broken right hip. EXAM: PORTABLE LEFT TIBIA AND  FIBULA - 2 VIEW COMPARISON:  Left femur 08/20/2023 FINDINGS: Examination is technically limited with single lateral view obtained. Lateral view demonstrates a left total knee arthroplasty. As visualized, components appear well seated. The tibia and fibula appear intact. No acute displaced fractures are identified. IMPRESSION:  Left total knee arthroplasty. No acute displaced fractures demonstrated on limited lateral view of the left lower leg. Electronically Signed   By: Elsie Gravely M.D.   On: 08/20/2023 21:21   DG Tibia/Fibula Right Result Date: 08/20/2023 CLINICAL DATA:  Lower leg pain after a fall with broken right hip. EXAM: RIGHT TIBIA AND FIBULA - 2 VIEW COMPARISON:  Right femur 08/13/2023 FINDINGS: Limited lateral only view of the right tibia/fibula demonstrates a right total knee arthroplasty. As visualized, the components appear well seated. No significant effusion. Visualized tibia and fibula appear intact. No displaced fractures are identified on single view. IMPRESSION: Right total knee arthroplasty. No displaced fractures demonstrated in the right tibia/fibula on single view. Electronically Signed   By: Elsie Gravely M.D.   On: 08/20/2023 21:20   CT HEAD WO CONTRAST Result Date: 08/20/2023 CLINICAL DATA:  Head trauma, moderate to severe. Poly trauma, blunt. Patient fell 9 days ago. EXAM: CT HEAD WITHOUT CONTRAST CT CERVICAL SPINE WITHOUT CONTRAST TECHNIQUE: Multidetector CT imaging of the head and cervical spine was performed following the standard protocol without intravenous contrast. Multiplanar CT image reconstructions of the cervical spine were also generated. RADIATION DOSE REDUCTION: This exam was performed according to the departmental dose-optimization program which includes automated exposure control, adjustment of the mA and/or kV according to patient size and/or use of iterative reconstruction technique. COMPARISON:  MRI brain, CT head, and CT cervical spine 05/12/2023 FINDINGS:  CT HEAD FINDINGS Brain: Diffuse cerebral atrophy. Ventricular dilatation consistent with central atrophy. Low-attenuation changes in the deep white matter consistent with small vessel ischemia. No abnormal extra-axial fluid collections. No mass effect or midline shift. Gray-white matter junctions are distinct. Basal cisterns are not effaced. No acute intracranial hemorrhage. Vascular: No hyperdense vessel or unexpected calcification. Skull: Normal. Negative for fracture or focal lesion. Sinuses/Orbits: No acute finding. Other: None. CT CERVICAL SPINE FINDINGS Alignment: Slight anterior subluxations at C3-4 and C4-5 levels and retrolisthesis at C5-6 level are unchanged since prior study, likely degenerative. Skull base and vertebrae: Skull base appears intact. No vertebral compression deformities. No focal bone lesion or bone destruction. Soft tissues and spinal canal: No prevertebral soft tissue swelling. No abnormal paraspinal soft tissue mass or infiltration. Ligamentous calcification at the craniocervical junction. Disc levels: Degenerative changes throughout the cervical spine with narrowed disc spaces and endplate osteophyte formation throughout. Degenerative changes in the cervical facet joints. Upper chest: Lung apices are clear. Other: None. IMPRESSION: 1. No acute intracranial abnormalities. Chronic atrophy and small vessel ischemic changes similar to prior studies. 2. Prominent degenerative changes throughout the cervical spine. No acute displaced fractures are identified. Electronically Signed   By: Elsie Gravely M.D.   On: 08/20/2023 18:21   CT CERVICAL SPINE WO CONTRAST Result Date: 08/20/2023 CLINICAL DATA:  Head trauma, moderate to severe. Poly trauma, blunt. Patient fell 9 days ago. EXAM: CT HEAD WITHOUT CONTRAST CT CERVICAL SPINE WITHOUT CONTRAST TECHNIQUE: Multidetector CT imaging of the head and cervical spine was performed following the standard protocol without intravenous contrast.  Multiplanar CT image reconstructions of the cervical spine were also generated. RADIATION DOSE REDUCTION: This exam was performed according to the departmental dose-optimization program which includes automated exposure control, adjustment of the mA and/or kV according to patient size and/or use of iterative reconstruction technique. COMPARISON:  MRI brain, CT head, and CT cervical spine 05/12/2023 FINDINGS: CT HEAD FINDINGS Brain: Diffuse cerebral atrophy. Ventricular dilatation consistent with central atrophy. Low-attenuation changes in the deep white matter consistent with small vessel  ischemia. No abnormal extra-axial fluid collections. No mass effect or midline shift. Gray-white matter junctions are distinct. Basal cisterns are not effaced. No acute intracranial hemorrhage. Vascular: No hyperdense vessel or unexpected calcification. Skull: Normal. Negative for fracture or focal lesion. Sinuses/Orbits: No acute finding. Other: None. CT CERVICAL SPINE FINDINGS Alignment: Slight anterior subluxations at C3-4 and C4-5 levels and retrolisthesis at C5-6 level are unchanged since prior study, likely degenerative. Skull base and vertebrae: Skull base appears intact. No vertebral compression deformities. No focal bone lesion or bone destruction. Soft tissues and spinal canal: No prevertebral soft tissue swelling. No abnormal paraspinal soft tissue mass or infiltration. Ligamentous calcification at the craniocervical junction. Disc levels: Degenerative changes throughout the cervical spine with narrowed disc spaces and endplate osteophyte formation throughout. Degenerative changes in the cervical facet joints. Upper chest: Lung apices are clear. Other: None. IMPRESSION: 1. No acute intracranial abnormalities. Chronic atrophy and small vessel ischemic changes similar to prior studies. 2. Prominent degenerative changes throughout the cervical spine. No acute displaced fractures are identified. Electronically Signed   By:  Elsie Gravely M.D.   On: 08/20/2023 18:21   CT CHEST ABDOMEN PELVIS WO CONTRAST Result Date: 08/20/2023 CLINICAL DATA:  Weakness, trauma, cough and congestion, malodorous urine, fall 9 days ago. Advanced dementia. EXAM: CT CHEST, ABDOMEN AND PELVIS WITHOUT CONTRAST TECHNIQUE: Multidetector CT imaging of the chest, abdomen and pelvis was performed following the standard protocol without IV contrast. RADIATION DOSE REDUCTION: This exam was performed according to the departmental dose-optimization program which includes automated exposure control, adjustment of the mA and/or kV according to patient size and/or use of iterative reconstruction technique. COMPARISON:  Multiple exams, including abdominal CT 12/25/2009 and thoracic spine CT 05/12/2023 FINDINGS: CT CHEST FINDINGS Cardiovascular: Atheromatous vascular calcification of the aortic arch. Mediastinum/Nodes: Unremarkable Lungs/Pleura: Mild biapical pleuroparenchymal scarring. Airway thickening is present, suggesting bronchitis or reactive airways disease. Layering mucus in the right mainstem bronchus. Nodular and branching opacities in the left upper lobe favoring atypical infectious bronchiolitis given the clustered appearance, largest individual nodule 0.8 by 0.6 cm on image 62 series 5. Musculoskeletal: Degenerative glenohumeral arthropathy bilaterally. CT ABDOMEN PELVIS FINDINGS Hepatobiliary: Unremarkable Pancreas: Unremarkable Spleen: Unremarkable Adrenals/Urinary Tract: Bosniak category 1 cyst of the right kidney lower pole laterally on image 69 series 3. Similar lesion in the left kidney upper pole. No further imaging workup of these lesions is indicated. Adrenal glands in urinary bladder unremarkable. Stomach/Bowel: Unremarkable Vascular/Lymphatic: Atherosclerosis is present, including aortoiliac atherosclerotic disease. Reproductive: 2.6 cm calcified uterine fibroid. Otherwise unremarkable. Other: No supplemental non-categorized findings.  Musculoskeletal: Subacute fracture of the RIGHT femoral neck is substantially displaced and demonstrates varus angulation. The left hip is substantially flexed and adducted but I do not see a fracture of the left proximal femur/hip or of the left hemipelvis. Interbody fusion at L1-2 and at L3-L4-L5 with posterolateral rod and pedicle screw fixation at L4-5. No acute lumbar spine findings. IMPRESSION: 1. Subacute fracture of the RIGHT femoral neck is substantially displaced and demonstrates varus angulation. 2. Nodular and branching opacities in the left upper lobe favoring atypical infectious bronchiolitis given the clustered appearance, largest individual nodule 0.8 by 0.6 cm. 3. Airway thickening is present, suggesting bronchitis or reactive airways disease. Layering mucus in the right mainstem bronchus. 4. Degenerative glenohumeral arthropathy bilaterally. 5. 2.6 cm calcified uterine fibroid. 6.  Aortic Atherosclerosis (ICD10-I70.0). Electronically Signed   By: Ryan Salvage M.D.   On: 08/20/2023 18:00   DG FEMUR PORT MIN 2 VIEWS LEFT Result Date:  08/20/2023 CLINICAL DATA:  Left hip pain EXAM: LEFT FEMUR PORTABLE 1 VIEW COMPARISON:  Pelvic radiograph from [facility] dated 08/13/2023 FINDINGS: There is a right femoral neck fracture. Due to difficulties with patient cooperation and discomfort, a single nonstandard oblique projection is obtained on 2 images. I do not see a definite left femoral fracture on this single view. There is a left total knee prosthesis observed. The left hip is held with prominent adduction. IMPRESSION: 1. Right (contralateral) femoral neck fracture. 2. No definite left femoral fracture on this single view. 3. Left total knee prosthesis. 4. Prominent adduction of the left hip. Electronically Signed   By: Ryan Salvage M.D.   On: 08/20/2023 17:50   DG Chest 1 View Result Date: 08/20/2023 CLINICAL DATA:  Pneumonia, left hip pain EXAM: CHEST  1 VIEW COMPARISON:   05/12/2023 FINDINGS: Subtle left suprahilar nodularity, for further characterization on the patient's CT scan. The lungs appear otherwise clear. Cardiac and mediastinal margins appear normal. No blunting of the costophrenic angles. Degenerative glenohumeral arthropathy is present bilaterally. IMPRESSION: 1. Subtle left suprahilar nodularity, for further characterization on the patient's chest CT scan. 2. Degenerative glenohumeral arthropathy bilaterally. Electronically Signed   By: Ryan Salvage M.D.   On: 08/20/2023 17:46      Vernal Alstrom, MD  [facility] 08/21/2023  If 7PM-7AM, please contact night-coverage

## 2023-08-21 NOTE — Progress Notes (Signed)
 Spoken with Josiah Nigh, husband.  They wish for surgery.    Possible surgery tomorrow with Dr. Pryor Browning if optimized.   Npo after mn.  Neville Barbone, MD

## 2023-08-21 NOTE — ED Notes (Signed)
 Pt now awake and alert after turning patient to change sheets. Family at bedside.

## 2023-08-21 NOTE — Progress Notes (Signed)
 [facility] ED12 [facility] Liaison Note?     [name] is a current [facility] hospice patient with a terminal diagnosis of Alzheimer's Disease with early onset. Patient's husband reported that he was feeding patient breakfast and at the end of the meal, patient threw her head back and her eyes rolled back in her head. [facility] nurse arrived for PRN visit, but EMS had already been called and was on sight. Per husband's request, patient was transferred to the ED to be checked out. Family also reports recent change in mental status post fall at home. Patient was admitted on 2.5.25 with concerns for Influenza A and closed fracture of the right hip. Per Dr. Norleen Laurence with [facility] this is a related hospital admission.    Visited patient in hospital with son and husband at bedside. Report exchanged with hospital team. Patient awake and somewhat agitated. She becomes extremely agitated and calls out with any kind of movement. Per ortho consult, patient may possibly undergo surgery tomorrow.     Patient is inpatient appropriate due to possible surgical intervention and IV antibiotics.   Vital Signs:?97.8/75/12    132/61    97% on 2LPM   I/O:  140.7/350   Abnormal labs: Chloride 112, CO2 19, BUN 32, Calcium  8.1, Albumin 2.6, AST 101, ALT 50, Bilirubin 0.3, CK Total 2,414, Troponin 837, HDL 19, CRP 10.8, WBC 12.0  Diagnostics: DG Tibia/Fibula, CT chest abdomen pelvis w/o contrast, CT cervical spine w/o contrast, CT head w/o contrast, DG Femur, DG Chest  IV/PRN Meds: Dilaudid  0.5 mg IV x1, Haldol  0.5 mg IV x1, lactated ringers  100 mL/hr cont., Heparin  5.5 mL/hr cont, Unasyn  3g q12 hrs x2   Problem list per MD note Pokhrel, Laxman, MD 2.2.25:  ? Assessment/Plan: Principal Problem:   Femoral fracture (HCC) Active Problems:   Dementia with behavioral disturbance (HCC)   Elevated LFTs   CKD (chronic kidney disease)   Influenza A   Right femoral neck fracture with history of  recurrent falls. CT scan of the abdomen pelvis with acute/subacute right femoral neck fracture subacute occult with angulation.  Orthopedics has been consulted and plan for palliative hip hemiarthroplasty. Continue pain medication regimen.  Sed rate elevated at 34.  Patient's family wishes to undergo surgical intervention.  Communicated with orthopedics and wishes to be at [facility] long hospital for potential surgery tomorrow.   Elevated CK levels.  Continue with hydration.  Monitor closely.   Elevated troponins.  Seen by cardiology.  Troponin elevation up to 1K.  Possible myocarditis from influenza A.  Heparin  drip has been discontinued by cardiology.  Check 2D echocardiogram for wall motion abnormality.  Received aspirin  as well..  Patient has moderate to high cardiac risk undergo surgery..   Influenza A Mild leukocytosis and elevated lactate on presentation..  CT scan with  possible left upper lobe opacity and nodular fashion possible infectious bronchiolitis.  Outside the window for Tamiflu .  Empirically on Unasyn  for possible pneumonia..  Will check check a speech and swallow when able..  Blood cultures negative in less than 12 hours.  Continue with hydration for now.   CKD (chronic kidney disease) Initial creatinine of 1.15 with BUN of 39.  Monitor closely.  Continue IV fluids for today.   Elevated LFTs Likely secondary to elevated CK levels.  Continue to monitor.   Advanced dementia with behavioral disturbance  Behavioral disturbance with new for 1 week likely secondary to pain.  Cocci Contin and Tylenol  with some Dilaudid .  Might  need Haldol  for agitation.  Delirium precautions.  Might benefit from ongoing discussion about goals of care.  Discharge Planning:? Discharge back home once medically stable   Family contact: talked with patient's son and husband at bedside    IDT:? Updated?     Goals of Care: DNR  If patient requires EMS transport at discharge, please us  [facility] as that is  who [facility] is contracted with for transport.   Please call with any hospice related questions or concerns.   [facility] hospital liaison [phone]

## 2023-08-21 NOTE — ED Notes (Signed)
 [facility] here to transport patient to [facility].

## 2023-08-21 NOTE — Consult Note (Signed)
 Cardiology Consultation   Patient ID: [name] MRN: [id number]; DOB: 1946-06-15  Admit date: 08/20/2023 Date of Consult: 08/21/2023  PCP:  Arloa Elsie SAUNDERS, MD   [facility] Providers Cardiologist:  None        Patient Profile:   [name] is a 78 y.o. female with a hx of end-stage Alzheimer's dementia, recurrent falls, and CKD who is being seen 08/21/2023 for the evaluation of elevated troponins at the request of Dr. Moody.  History of Present Illness:   [name] is unfortunately nonverbal so the following history was obtained from her son and via chart review.  The patient reportedly suffered a mechanical fall about a week ago resulting in significant right hip pain.  She was taken to urgent care and underwent x-ray imaging that was reportedly negative for acute fracture.  Upon returning home however, the patient was not at her functional baseline.  At baseline the patient is reportedly ambulatory and moves around fairly well, but she is nonverbal.  She lives at home with her husband who helps provide for her.  Additionally the patient has had URI symptoms including cough and congestion.  Denies fever.  Over the course of the past week the patient has largely remained in bed.  At some point this past week home hospice evaluated the patient and plan to get the patient a hospital bed for home.  With her ongoing deterioration, and initial discussions about hospice, her family felt her clinical change was too rapid and elected to bring her in for further evaluation.  Of note the patient is not on hospice at this time.  In the ED her VS were afebrile, BP 117/64, HR 84, RR 18, satting 93% on RA.  Labs notable for WBC 13, potassium 3.4, creatinine 1.1, AST 109, ALT 53, lactate 2.4, and troponins 1155.  Respiratory viral panel was positive for influenza A.  CT imaging revealed a right femoral neck fracture, negative head CT, and nodular/branching opacities in the left upper lobe  favoring atypical infection.  I will review the CT imaging it appears that she has minimal calcification in the left circumflex.  ECG was negative for ischemic changes.  The patient is unable to engage in ROS; however, her son denies the patient ever demonstrating evidence of chest pain or SOB.   Past Medical History:  Diagnosis Date   Anxiety    Arthritis    lower back & knees   Back pain    Cataracts, bilateral    Depression    takes Citalopram  daily   Encephalitis 1973   Headache(784.0)    occasionally from a fall a month ago   Hearing loss    History of blood transfusion    no abnormal reaction noted   Hypertension    Insomnia    Joint pain    Joint swelling    Memory loss    Nocturia    Osteoarthritis    Rosacea     Past Surgical History:  Procedure Laterality Date   BACK SURGERY  2007   lumbar fusion w/ rods & screws- lower back   COLONOSCOPY     CYST EXCISION  2005   lumbar cyst   JOINT REPLACEMENT  2009   left knee replacement   MASS EXCISION  twice: 08/28/2011; 2012   Procedure: EXCISION MASS;  Surgeon: Arley SAUNDERS Curia, MD;  Location: [facility];  Service: Orthopedics;  Laterality: Left;  excision mass left ring finger  MASS EXCISION  02/11/2012   Procedure: EXCISION MASS;  Surgeon: Arley JONELLE Curia, MD;  Location: [facility];  Service: Orthopedics;  Laterality: Left;  EXCISION MUCOID CYST, DEBRIDEMENT INTERPHALANGEAL JOINT LEFT THUMB   mass removed from left ring finger  2013   RETINAL DETACHMENT SURGERY Right    SHOULDER ARTHROSCOPY WITH ROTATOR CUFF REPAIR AND SUBACROMIAL DECOMPRESSION Left 07/26/2013   Procedure: LEFT SHOULDER ARTHROSCOPY WITH /SUBACROMIAL DECOMPRESSION AND DISTAL CLAVICLE RESECTION;  Surgeon: LELON JONETTA Shari Mickey., MD;  Location: [facility];  Service: Orthopedics;  Laterality: Left;   TOTAL KNEE ARTHROPLASTY Right 01/07/2014   Procedure: RIGHT TOTAL KNEE ARTHROPLASTY MEDIAL/LATERAL COMPARTMENTS WITH PATELLA  RESURFACING;  Surgeon: LELON JONETTA Shari Mickey., MD;  Location: [facility];  Service: Orthopedics;  Laterality: Right;       Inpatient Medications: Scheduled Meds:  acetaminophen   650 mg Oral Q4H   aspirin   324 mg Oral Once   atorvastatin   40 mg Oral Daily   enoxaparin  (LOVENOX ) injection  30 mg Subcutaneous QHS   oseltamivir   30 mg Oral BID   oxyCODONE   10 mg Oral Q12H   senna-docusate  2 tablet Oral BID   sodium chloride  flush  3 mL Intravenous Q12H   Continuous Infusions:  ampicillin -sulbactam (UNASYN ) IV 3 g (08/20/23 2312)   lactated ringers  100 mL/hr at 08/20/23 2122   PRN Meds: acetaminophen  **OR** acetaminophen , haloperidol  lactate, HYDROmorphone  (DILAUDID ) injection, polyethylene glycol, QUEtiapine   Allergies:    Allergies  Allergen Reactions   Aricept  [Donepezil ] Diarrhea   Sulfa Antibiotics Hives   Wellbutrin [Bupropion] Other (See Comments)    Body aches     Social History:   Social History   Socioeconomic History   Marital status: Married    Spouse name: Not on file   Number of children: 2   Years of education: College   Highest education level: Not on file  Occupational History   Occupation: Retired  Tobacco Use   Smoking status: Never   Smokeless tobacco: Never  Vaping Use   Vaping status: Never Used  Substance and Sexual Activity   Alcohol  use: No    Alcohol /week: 0.0 standard drinks of alcohol    Drug use: No   Sexual activity: Yes    Partners: Male    Birth control/protection: Post-menopausal  Other Topics Concern   Not on file  Social History Narrative   Lives at home with her husband.   Left-handed.   No caffeine per day.   Social Drivers of Corporate Investment Banker Strain: Not on file  Food Insecurity: Not on file  Transportation Needs: Not on file  Physical Activity: Not on file  Stress: Not on file  Social Connections: Not on file  Intimate Partner Violence: Not on file    Family History:    Family History  Problem Relation Age of  Onset   Pancreatic cancer Mother    Alcohol  abuse Father      ROS:  Please see the history of present illness.  All other ROS reviewed and negative.     Physical Exam/Data:   Vitals:   08/20/23 1843 08/20/23 1945 08/20/23 2200 08/20/23 2245  BP: 113/76 (!) 111/58 (!) 145/67   Pulse: 78 75 90   Resp: (!) 24 15 12    Temp: 97.6 F (36.4 C)   97.8 F (36.6 C)  TempSrc: Axillary   Axillary  SpO2: 93% 97% 97%   Weight:      Height:  Intake/Output Summary (Last 24 hours) at 08/21/2023 0001 Last data filed at 08/20/2023 1820 Gross per 24 hour  Intake --  Output 350 ml  Net -350 ml      08/20/2023    1:58 PM 05/12/2023    2:38 PM 07/15/2022    4:31 PM  Last 3 Weights  Weight (lbs) 105 lb 119 lb 0.8 oz 121 lb  Weight (kg) 47.628 kg 54 kg 54.885 kg     Body mass index is 20.51 kg/m.  General: Elderly appearing female laying in bed in mild distress HEENT: normal, atraumatic, normocephalic Neck: no JVD Vascular: Radial pulses 2+ bilaterally Cardiac:  normal S1, S2; RRR; no murmur Lungs: Bilateral rhonchi heard throughout with concomitant transmitted upper airway sounds, adequate air movement, no wheezes or rails Abd: soft, nontender, no hepatomegaly  Ext: no edema Musculoskeletal: WWP Skin: warm and dry  Neuro: Nonverbal, grossly moves all 4 extremities Psych: Nonverbal  EKG:  The EKG was personally reviewed and demonstrates: NSR      Telemetry:  Telemetry was personally reviewed and demonstrates: NSR  Relevant CV Studies:  TTE 03/11/2006:   SUMMARY   -  Overall left ventricular systolic function was normal. Left         ventricular ejection fraction was estimated , range being 60         % to 65 %. There was no diagnostic evidence of left         ventricular regional wall motion abnormalities.   -  There was mild mitral valvular regurgitation.   -  There was mild tricuspid valvular regurgitation.   Laboratory Data:  High Sensitivity Troponin:   Recent  Labs  Lab 08/20/23 2026  TROPONINIHS 1,155*     Chemistry Recent Labs  Lab 08/20/23 1502 08/20/23 2026  NA 143  --   K 3.4*  --   CL 106  --   CO2 25  --   GLUCOSE 103*  --   BUN 39*  --   CREATININE 1.15* 0.91  CALCIUM  8.5*  --   GFRNONAA 48* >60  ANIONGAP 12  --     Recent Labs  Lab 08/20/23 1502  PROT 6.9  ALBUMIN 2.8*  AST 109*  ALT 53*  ALKPHOS 65  BILITOT 0.7   Lipids No results for input(s): CHOL, TRIG, HDL, LABVLDL, LDLCALC, CHOLHDL in the last 168 hours.  Hematology Recent Labs  Lab 08/20/23 1502 08/20/23 2026  WBC 13.0* 9.6  RBC 4.82 4.63  HGB 14.4 13.7  HCT 43.6 42.9  MCV 90.5 92.7  MCH 29.9 29.6  MCHC 33.0 31.9  RDW 13.0 12.9  PLT 335 240   Thyroid  No results for input(s): TSH, FREET4 in the last 168 hours.  BNPNo results for input(s): BNP, PROBNP in the last 168 hours.  DDimer No results for input(s): DDIMER in the last 168 hours.   Radiology/Studies:  DG Tibia/Fibula Left Port Result Date: 08/20/2023 CLINICAL DATA:  Lower leg pain after a fall with broken right hip. EXAM: PORTABLE LEFT TIBIA AND FIBULA - 2 VIEW COMPARISON:  Left femur 08/20/2023 FINDINGS: Examination is technically limited with single lateral view obtained. Lateral view demonstrates a left total knee arthroplasty. As visualized, components appear well seated. The tibia and fibula appear intact. No acute displaced fractures are identified. IMPRESSION: Left total knee arthroplasty. No acute displaced fractures demonstrated on limited lateral view of the left lower leg. Electronically Signed   By: Elsie Gravely M.D.   On: 08/20/2023  21:21   DG Tibia/Fibula Right Result Date: 08/20/2023 CLINICAL DATA:  Lower leg pain after a fall with broken right hip. EXAM: RIGHT TIBIA AND FIBULA - 2 VIEW COMPARISON:  Right femur 08/13/2023 FINDINGS: Limited lateral only view of the right tibia/fibula demonstrates a right total knee arthroplasty. As visualized, the components  appear well seated. No significant effusion. Visualized tibia and fibula appear intact. No displaced fractures are identified on single view. IMPRESSION: Right total knee arthroplasty. No displaced fractures demonstrated in the right tibia/fibula on single view. Electronically Signed   By: Elsie Gravely M.D.   On: 08/20/2023 21:20   CT HEAD WO CONTRAST Result Date: 08/20/2023 CLINICAL DATA:  Head trauma, moderate to severe. Poly trauma, blunt. Patient fell 9 days ago. EXAM: CT HEAD WITHOUT CONTRAST CT CERVICAL SPINE WITHOUT CONTRAST TECHNIQUE: Multidetector CT imaging of the head and cervical spine was performed following the standard protocol without intravenous contrast. Multiplanar CT image reconstructions of the cervical spine were also generated. RADIATION DOSE REDUCTION: This exam was performed according to the departmental dose-optimization program which includes automated exposure control, adjustment of the mA and/or kV according to patient size and/or use of iterative reconstruction technique. COMPARISON:  MRI brain, CT head, and CT cervical spine 05/12/2023 FINDINGS: CT HEAD FINDINGS Brain: Diffuse cerebral atrophy. Ventricular dilatation consistent with central atrophy. Low-attenuation changes in the deep white matter consistent with small vessel ischemia. No abnormal extra-axial fluid collections. No mass effect or midline shift. Gray-white matter junctions are distinct. Basal cisterns are not effaced. No acute intracranial hemorrhage. Vascular: No hyperdense vessel or unexpected calcification. Skull: Normal. Negative for fracture or focal lesion. Sinuses/Orbits: No acute finding. Other: None. CT CERVICAL SPINE FINDINGS Alignment: Slight anterior subluxations at C3-4 and C4-5 levels and retrolisthesis at C5-6 level are unchanged since prior study, likely degenerative. Skull base and vertebrae: Skull base appears intact. No vertebral compression deformities. No focal bone lesion or bone destruction.  Soft tissues and spinal canal: No prevertebral soft tissue swelling. No abnormal paraspinal soft tissue mass or infiltration. Ligamentous calcification at the craniocervical junction. Disc levels: Degenerative changes throughout the cervical spine with narrowed disc spaces and endplate osteophyte formation throughout. Degenerative changes in the cervical facet joints. Upper chest: Lung apices are clear. Other: None. IMPRESSION: 1. No acute intracranial abnormalities. Chronic atrophy and small vessel ischemic changes similar to prior studies. 2. Prominent degenerative changes throughout the cervical spine. No acute displaced fractures are identified. Electronically Signed   By: Elsie Gravely M.D.   On: 08/20/2023 18:21   CT CERVICAL SPINE WO CONTRAST Result Date: 08/20/2023 CLINICAL DATA:  Head trauma, moderate to severe. Poly trauma, blunt. Patient fell 9 days ago. EXAM: CT HEAD WITHOUT CONTRAST CT CERVICAL SPINE WITHOUT CONTRAST TECHNIQUE: Multidetector CT imaging of the head and cervical spine was performed following the standard protocol without intravenous contrast. Multiplanar CT image reconstructions of the cervical spine were also generated. RADIATION DOSE REDUCTION: This exam was performed according to the departmental dose-optimization program which includes automated exposure control, adjustment of the mA and/or kV according to patient size and/or use of iterative reconstruction technique. COMPARISON:  MRI brain, CT head, and CT cervical spine 05/12/2023 FINDINGS: CT HEAD FINDINGS Brain: Diffuse cerebral atrophy. Ventricular dilatation consistent with central atrophy. Low-attenuation changes in the deep white matter consistent with small vessel ischemia. No abnormal extra-axial fluid collections. No mass effect or midline shift. Gray-white matter junctions are distinct. Basal cisterns are not effaced. No acute intracranial hemorrhage. Vascular: No hyperdense vessel or  unexpected calcification. Skull:  Normal. Negative for fracture or focal lesion. Sinuses/Orbits: No acute finding. Other: None. CT CERVICAL SPINE FINDINGS Alignment: Slight anterior subluxations at C3-4 and C4-5 levels and retrolisthesis at C5-6 level are unchanged since prior study, likely degenerative. Skull base and vertebrae: Skull base appears intact. No vertebral compression deformities. No focal bone lesion or bone destruction. Soft tissues and spinal canal: No prevertebral soft tissue swelling. No abnormal paraspinal soft tissue mass or infiltration. Ligamentous calcification at the craniocervical junction. Disc levels: Degenerative changes throughout the cervical spine with narrowed disc spaces and endplate osteophyte formation throughout. Degenerative changes in the cervical facet joints. Upper chest: Lung apices are clear. Other: None. IMPRESSION: 1. No acute intracranial abnormalities. Chronic atrophy and small vessel ischemic changes similar to prior studies. 2. Prominent degenerative changes throughout the cervical spine. No acute displaced fractures are identified. Electronically Signed   By: Elsie Gravely M.D.   On: 08/20/2023 18:21   CT CHEST ABDOMEN PELVIS WO CONTRAST Result Date: 08/20/2023 CLINICAL DATA:  Weakness, trauma, cough and congestion, malodorous urine, fall 9 days ago. Advanced dementia. EXAM: CT CHEST, ABDOMEN AND PELVIS WITHOUT CONTRAST TECHNIQUE: Multidetector CT imaging of the chest, abdomen and pelvis was performed following the standard protocol without IV contrast. RADIATION DOSE REDUCTION: This exam was performed according to the departmental dose-optimization program which includes automated exposure control, adjustment of the mA and/or kV according to patient size and/or use of iterative reconstruction technique. COMPARISON:  Multiple exams, including abdominal CT 12/25/2009 and thoracic spine CT 05/12/2023 FINDINGS: CT CHEST FINDINGS Cardiovascular: Atheromatous vascular calcification of the aortic arch.  Mediastinum/Nodes: Unremarkable Lungs/Pleura: Mild biapical pleuroparenchymal scarring. Airway thickening is present, suggesting bronchitis or reactive airways disease. Layering mucus in the right mainstem bronchus. Nodular and branching opacities in the left upper lobe favoring atypical infectious bronchiolitis given the clustered appearance, largest individual nodule 0.8 by 0.6 cm on image 62 series 5. Musculoskeletal: Degenerative glenohumeral arthropathy bilaterally. CT ABDOMEN PELVIS FINDINGS Hepatobiliary: Unremarkable Pancreas: Unremarkable Spleen: Unremarkable Adrenals/Urinary Tract: Bosniak category 1 cyst of the right kidney lower pole laterally on image 69 series 3. Similar lesion in the left kidney upper pole. No further imaging workup of these lesions is indicated. Adrenal glands in urinary bladder unremarkable. Stomach/Bowel: Unremarkable Vascular/Lymphatic: Atherosclerosis is present, including aortoiliac atherosclerotic disease. Reproductive: 2.6 cm calcified uterine fibroid. Otherwise unremarkable. Other: No supplemental non-categorized findings. Musculoskeletal: Subacute fracture of the RIGHT femoral neck is substantially displaced and demonstrates varus angulation. The left hip is substantially flexed and adducted but I do not see a fracture of the left proximal femur/hip or of the left hemipelvis. Interbody fusion at L1-2 and at L3-L4-L5 with posterolateral rod and pedicle screw fixation at L4-5. No acute lumbar spine findings. IMPRESSION: 1. Subacute fracture of the RIGHT femoral neck is substantially displaced and demonstrates varus angulation. 2. Nodular and branching opacities in the left upper lobe favoring atypical infectious bronchiolitis given the clustered appearance, largest individual nodule 0.8 by 0.6 cm. 3. Airway thickening is present, suggesting bronchitis or reactive airways disease. Layering mucus in the right mainstem bronchus. 4. Degenerative glenohumeral arthropathy  bilaterally. 5. 2.6 cm calcified uterine fibroid. 6.  Aortic Atherosclerosis (ICD10-I70.0). Electronically Signed   By: Ryan Salvage M.D.   On: 08/20/2023 18:00   DG FEMUR PORT MIN 2 VIEWS LEFT Result Date: 08/20/2023 CLINICAL DATA:  Left hip pain EXAM: LEFT FEMUR PORTABLE 1 VIEW COMPARISON:  Pelvic radiograph from [facility] dated 08/13/2023 FINDINGS: There is a right femoral neck  fracture. Due to difficulties with patient cooperation and discomfort, a single nonstandard oblique projection is obtained on 2 images. I do not see a definite left femoral fracture on this single view. There is a left total knee prosthesis observed. The left hip is held with prominent adduction. IMPRESSION: 1. Right (contralateral) femoral neck fracture. 2. No definite left femoral fracture on this single view. 3. Left total knee prosthesis. 4. Prominent adduction of the left hip. Electronically Signed   By: Ryan Salvage M.D.   On: 08/20/2023 17:50   DG Chest 1 View Result Date: 08/20/2023 CLINICAL DATA:  Pneumonia, left hip pain EXAM: CHEST  1 VIEW COMPARISON:  05/12/2023 FINDINGS: Subtle left suprahilar nodularity, for further characterization on the patient's CT scan. The lungs appear otherwise clear. Cardiac and mediastinal margins appear normal. No blunting of the costophrenic angles. Degenerative glenohumeral arthropathy is present bilaterally. IMPRESSION: 1. Subtle left suprahilar nodularity, for further characterization on the patient's chest CT scan. 2. Degenerative glenohumeral arthropathy bilaterally. Electronically Signed   By: Ryan Salvage M.D.   On: 08/20/2023 17:46     Assessment and Plan:   Sharine Cadle is a 78 y.o. female with a hx of end-stage Alzheimer's dementia, recurrent falls, and CKD who is being seen 08/21/2023 for the evaluation of elevated troponins at the request of Dr. Moody.  #Myocardial Injury #Possible Myocarditis #Influenza A Infection :: Patient presenting after  suffering a mechanical fall and right hip fracture.  She was found to be positive for influenza A with a prodrome of URI symptoms.  Troponins were checked and noted to be markedly elevated >1000.  She has no obvious ischemic changes on ECG.  She is largely nonverbal so elucidating chest pain symptoms or equivalents is challenging; however, her family denies seeing any evidence of chest discomfort.  Furthermore, I attempted a bedside POCUS but the patient's restricted movement given her hip fracture and poor windows made it challenging to definitively rule out WMA's (although from what I could see it looks like her EF is preserved).  No large pericardial effusion.  My differential for her elevated troponins includes myocarditis from influenza A vs critical illness vs stress-induced cardiomyopathy vs ACS with myocarditis and critical illness being highest on the differential.  For now, will load with aspirin , heparinized and obtain an echocardiogram.  If the echocardiogram is normal then I suspect will be at a stop heparin  and manage for presumed myocarditis.   -s/p ASA load -Heparin  per pharmacy -Complete echo -Trend troponins -Tamiflu  per primary team -Consider coronary artery evaluation pending further workup  #Right Femur Fracture :: Patient being managed by medicine currently.  Will defer management to primary team and orthopedic surgery. -Per primary team and orthopedic surgery  #End-Stage Alzheimer's Dementia -Patient may need more GOC conversations during this hospitalization pending clinical course    Risk Assessment/Risk Scores:                For questions or updates, please contact [facility] Please consult [email or web address] for contact info under    Signed, Georganna Archer, MD  08/21/2023 12:01 AM

## 2023-08-21 NOTE — Consult Note (Signed)
 ORTHOPAEDIC CONSULTATION  REQUESTING PHYSICIAN: Moody Alto, MD  Chief Complaint: Right hip pain  HPI: [name] is a 78 y.o. female who complains of right hip pain after a fall that occurred approximately 6 days ago.  Apparently x-rays were taken, which were unrevealing, she then went back home, was actually started on hospice apparently, she has had cough, with congestion, and is effectively nonverbal due to dementia.  She has however been complaining of significant pain around the right hip with any kind of movement.  The family brought her back to the hospital for second opinion, and she was found to have a right hip fracture.  Pain is worse with movement.  She has had persistent cough and congestion.  Her son is at the bedside, Okey, who is in from [place], who provided me with most of the history.  Past Medical History:  Diagnosis Date   Anxiety    Arthritis    lower back & knees   Back pain    Cataracts, bilateral    Depression    takes Citalopram  daily   Encephalitis 1973   Headache(784.0)    occasionally from a fall a month ago   Hearing loss    History of blood transfusion    no abnormal reaction noted   Hypertension    Insomnia    Joint pain    Joint swelling    Memory loss    Nocturia    Osteoarthritis    Rosacea    Past Surgical History:  Procedure Laterality Date   BACK SURGERY  2007   lumbar fusion w/ rods & screws- lower back   COLONOSCOPY     CYST EXCISION  2005   lumbar cyst   JOINT REPLACEMENT  2009   left knee replacement   MASS EXCISION  twice: 08/28/2011; 2012   Procedure: EXCISION MASS;  Surgeon: Arley JONELLE Curia, MD;  Location: [facility];  Service: Orthopedics;  Laterality: Left;  excision mass left ring finger   MASS EXCISION  02/11/2012   Procedure: EXCISION MASS;  Surgeon: Arley JONELLE Curia, MD;  Location: [facility];  Service: Orthopedics;  Laterality: Left;  EXCISION MUCOID CYST, DEBRIDEMENT INTERPHALANGEAL  JOINT LEFT THUMB   mass removed from left ring finger  2013   RETINAL DETACHMENT SURGERY Right    SHOULDER ARTHROSCOPY WITH ROTATOR CUFF REPAIR AND SUBACROMIAL DECOMPRESSION Left 07/26/2013   Procedure: LEFT SHOULDER ARTHROSCOPY WITH /SUBACROMIAL DECOMPRESSION AND DISTAL CLAVICLE RESECTION;  Surgeon: LELON JONETTA Shari Mickey., MD;  Location: [facility];  Service: Orthopedics;  Laterality: Left;   TOTAL KNEE ARTHROPLASTY Right 01/07/2014   Procedure: RIGHT TOTAL KNEE ARTHROPLASTY MEDIAL/LATERAL COMPARTMENTS WITH PATELLA RESURFACING;  Surgeon: LELON JONETTA Shari Mickey., MD;  Location: [facility];  Service: Orthopedics;  Laterality: Right;   Social History   Socioeconomic History   Marital status: Married    Spouse name: Not on file   Number of children: 2   Years of education: College   Highest education level: Not on file  Occupational History   Occupation: Retired  Tobacco Use   Smoking status: Never   Smokeless tobacco: Never  Vaping Use   Vaping status: Never Used  Substance and Sexual Activity   Alcohol  use: No    Alcohol /week: 0.0 standard drinks of alcohol    Drug use: No   Sexual activity: Yes    Partners: Male    Birth control/protection: Post-menopausal  Other Topics Concern   Not on  file  Social History Narrative   Lives at home with her husband.   Left-handed.   No caffeine per day.   Social Drivers of Corporate Investment Banker Strain: Not on file  Food Insecurity: Not on file  Transportation Needs: Not on file  Physical Activity: Not on file  Stress: Not on file  Social Connections: Not on file   Family History  Problem Relation Age of Onset   Pancreatic cancer Mother    Alcohol  abuse Father    Allergies  Allergen Reactions   Aricept  [Donepezil ] Diarrhea   Sulfa Antibiotics Hives   Wellbutrin [Bupropion] Other (See Comments)    Body aches      Positive ROS: All other systems have been reviewed and were otherwise negative with the exception of those  mentioned in the HPI and as above.  Physical Exam: BP 132/74   Pulse 86   Temp 97.8 F (36.6 C) (Axillary)   Resp 12   Ht 5' (1.524 m)   Wt 47.6 kg   SpO2 97%   BMI 20.51 kg/m   General: Patient is lethargic, and goes in and out of being awake and crying with pain. Cardiovascular: No pedal edema Respiratory: No cyanosis, no use of accessory musculature GI: No organomegaly, abdomen is soft and non-tender Skin: No lesions in the area of chief complaint Neurologic: Sensation intact distally Psychiatric: Patient is not competent for consent, son is at the bedside  MUSCULOSKELETAL: Right hip has intact dorsiflexion at the toes, she does not really follow commands but she does move her feet.  She is laying on her right side.  Positive pain with movement.  Assessment: Principal Problem:   Femoral fracture (HCC) Active Problems:   Dementia with behavioral disturbance (HCC)   Elevated LFTs   CKD (chronic kidney disease)   Influenza A   Plan: This is a subacute severe problem, and carries risks of morbidity and mortality both with nonsurgical versus surgical options.  I have discussed at length with her son, who is going to discuss further with the family.  She was capable of ambulating normally as recently as 6 days ago, and although her mental function has been declining, she was able to laugh, and interact with the family.  We will plan for medical optimization today, possible surgical intervention on Friday if this is in fact the family's wishes, and I will follow-up with them further as she progresses in the management of her acute pulmonary status.  She has influenza, and apparently a possible pneumonia, and I will defer to the medical physicians for diagnosis, management and treatment of that.    Fonda SHAUNNA Olmsted, MD Cell [phone]   08/21/2023 2:21 AM

## 2023-08-21 NOTE — Evaluation (Signed)
 Clinical/Bedside Swallow Evaluation Patient Details  Name: Elis Rawlinson MRN: [id number] Date of Birth: 1946-06-29  Today's Date: 08/21/2023 Time: SLP Start Time (ACUTE ONLY): 1009 SLP Stop Time (ACUTE ONLY): 1019 SLP Time Calculation (min) (ACUTE ONLY): 10 min  Past Medical History:  Past Medical History:  Diagnosis Date   Anxiety    Arthritis    lower back & knees   Back pain    Cataracts, bilateral    Depression    takes Citalopram  daily   Encephalitis 1973   Headache(784.0)    occasionally from a fall a month ago   Hearing loss    History of blood transfusion    no abnormal reaction noted   Hypertension    Insomnia    Joint pain    Joint swelling    Memory loss    Nocturia    Osteoarthritis    Rosacea    Past Surgical History:  Past Surgical History:  Procedure Laterality Date   BACK SURGERY  2007   lumbar fusion w/ rods & screws- lower back   COLONOSCOPY     CYST EXCISION  2005   lumbar cyst   JOINT REPLACEMENT  2009   left knee replacement   MASS EXCISION  twice: 08/28/2011; 2012   Procedure: EXCISION MASS;  Surgeon: Arley JONELLE Curia, MD;  Location: [facility];  Service: Orthopedics;  Laterality: Left;  excision mass left ring finger   MASS EXCISION  02/11/2012   Procedure: EXCISION MASS;  Surgeon: Arley JONELLE Curia, MD;  Location: [facility];  Service: Orthopedics;  Laterality: Left;  EXCISION MUCOID CYST, DEBRIDEMENT INTERPHALANGEAL JOINT LEFT THUMB   mass removed from left ring finger  2013   RETINAL DETACHMENT SURGERY Right    SHOULDER ARTHROSCOPY WITH ROTATOR CUFF REPAIR AND SUBACROMIAL DECOMPRESSION Left 07/26/2013   Procedure: LEFT SHOULDER ARTHROSCOPY WITH /SUBACROMIAL DECOMPRESSION AND DISTAL CLAVICLE RESECTION;  Surgeon: LELON JONETTA Shari Mickey., MD;  Location: [facility];  Service: Orthopedics;  Laterality: Left;   TOTAL KNEE ARTHROPLASTY Right 01/07/2014   Procedure: RIGHT TOTAL KNEE ARTHROPLASTY MEDIAL/LATERAL COMPARTMENTS  WITH PATELLA RESURFACING;  Surgeon: LELON JONETTA Shari Mickey., MD;  Location: [facility];  Service: Orthopedics;  Laterality: Right;   HPI:  Selia Wareing is a 78 y.o. female with a hx of end-stage Alzheimer's dementia, recurrent falls, and CKD. Pt was being followed by home hospice. Began to have rapid deterioration at home, family elected to bring pt in to ED. Dx include myocardial injury, possible myocarditis, influenza A, subacute hip fx causing extreme pain.  PTA, pt eating/drinking without difficulty. Her husband has been caring for her the last eight years.    Assessment / Plan / Recommendation  Clinical Impression  Pt presents with functional swallowing with overall adequate toleration and excellent appetite.  Her husband and son were at the bedside. No hx of dysphagia.  Mr. Swilling had just finished feeding the pt breakfast. He resumed feeding so that her swallowing could be evaluated. She was reclined in bed due to severe hip pain from fx.  Despite positioning, she demonstrated no difficulty with mastication and no s/s of aspiration.  She did not follow commands, but was engaging in social communication and smiling.  Pt is tolerating current diet and her husband is managing her feeding well. No acute care SLP f/u is needed.  The time was not right for a discussion about dementia and how it may ultimately impact the swallow. Our service will sign off.  SLP Visit Diagnosis: Dysphagia, unspecified (R13.10)    Aspiration Risk  No limitations    Diet Recommendation   Thin;Age appropriate regular  Medication Administration: Whole meds with liquid    Other  Recommendations Oral Care Recommendations: Oral care BID    Recommendations for follow up therapy are one component of a multi-disciplinary discharge planning process, led by the attending physician.  Recommendations may be updated based on patient status, additional functional criteria and insurance authorization.  Follow up Recommendations No SLP follow  up        Swallow Study   General HPI: Shauna Bodkins is a 78 y.o. female with a hx of end-stage Alzheimer's dementia, recurrent falls, and CKD. Pt was being followed by home hospice. Began to have rapid deterioration at home, family elected to bring pt in to ED. Dx include myocardial injury, possible myocarditis, influenza A, subacute hip fx causing extreme pain.  PTA, pt eating/drinking without difficulty. Her husband has been caring for her the last eight years. Type of Study: Bedside Swallow Evaluation Diet Prior to this Study: Regular;Thin liquids (Level 0) Temperature Spikes Noted: No Respiratory Status: Nasal cannula History of Recent Intubation: No Behavior/Cognition: Alert Oral Care Completed by SLP: No Oral Cavity - Dentition: Adequate natural dentition Self-Feeding Abilities: Total assist Patient Positioning:  (reclined) Baseline Vocal Quality: Normal Volitional Cough: Cognitively unable to elicit Volitional Swallow: Unable to elicit    Oral/Motor/Sensory Function Overall Oral Motor/Sensory Function: Within functional limits   Ice Chips Ice chips: Not tested   Thin Liquid Thin Liquid: Within functional limits    Nectar Thick Nectar Thick Liquid: Not tested   Honey Thick Honey Thick Liquid: Not tested   Puree Puree: Within functional limits   Solid     Solid: Within functional limits      Vona Palma Laurice 08/21/2023,10:40 AM  Palma L. Vona, MA CCC/SLP Clinical Specialist - Acute Care SLP Acute Rehabilitation Services Office number [phone]

## 2023-08-22 ENCOUNTER — Inpatient Hospital Stay (HOSPITAL_COMMUNITY): Payer: PPO

## 2023-08-22 ENCOUNTER — Encounter (HOSPITAL_COMMUNITY): Payer: Self-pay | Admitting: Internal Medicine

## 2023-08-22 ENCOUNTER — Encounter (HOSPITAL_COMMUNITY)
Admission: EM | Disposition: A | Discharge status: Skilled Nursing Facility | Payer: Self-pay | Source: Home / Self Care | Attending: Family Medicine

## 2023-08-22 ENCOUNTER — Inpatient Hospital Stay (HOSPITAL_COMMUNITY): Payer: PPO | Admitting: Anesthesiology

## 2023-08-22 DIAGNOSIS — F418 Other specified anxiety disorders: Secondary | ICD-10-CM

## 2023-08-22 DIAGNOSIS — I351 Nonrheumatic aortic (valve) insufficiency: Secondary | ICD-10-CM | POA: Diagnosis not present

## 2023-08-22 DIAGNOSIS — R531 Weakness: Secondary | ICD-10-CM

## 2023-08-22 DIAGNOSIS — J101 Influenza due to other identified influenza virus with other respiratory manifestations: Secondary | ICD-10-CM

## 2023-08-22 DIAGNOSIS — S72001A Fracture of unspecified part of neck of right femur, initial encounter for closed fracture: Secondary | ICD-10-CM

## 2023-08-22 DIAGNOSIS — Z7189 Other specified counseling: Secondary | ICD-10-CM

## 2023-08-22 DIAGNOSIS — N189 Chronic kidney disease, unspecified: Secondary | ICD-10-CM

## 2023-08-22 DIAGNOSIS — Z515 Encounter for palliative care: Secondary | ICD-10-CM | POA: Diagnosis not present

## 2023-08-22 DIAGNOSIS — I129 Hypertensive chronic kidney disease with stage 1 through stage 4 chronic kidney disease, or unspecified chronic kidney disease: Secondary | ICD-10-CM

## 2023-08-22 HISTORY — PX: HIP ARTHROPLASTY: SHX981

## 2023-08-22 LAB — CBC
HCT: 36.8 % (ref 36.0–46.0)
Hemoglobin: 11.5 g/dL — ABNORMAL LOW (ref 12.0–15.0)
MCH: 29.5 pg (ref 26.0–34.0)
MCHC: 31.3 g/dL (ref 30.0–36.0)
MCV: 94.4 fL (ref 80.0–100.0)
Platelets: 301 10*3/uL (ref 150–400)
RBC: 3.9 MIL/uL (ref 3.87–5.11)
RDW: 13.2 % (ref 11.5–15.5)
WBC: 10.6 10*3/uL — ABNORMAL HIGH (ref 4.0–10.5)
nRBC: 0 % (ref 0.0–0.2)

## 2023-08-22 LAB — BASIC METABOLIC PANEL
Anion gap: 9 (ref 5–15)
BUN: 21 mg/dL (ref 8–23)
CO2: 23 mmol/L (ref 22–32)
Calcium: 7.8 mg/dL — ABNORMAL LOW (ref 8.9–10.3)
Chloride: 112 mmol/L — ABNORMAL HIGH (ref 98–111)
Creatinine, Ser: 0.72 mg/dL (ref 0.44–1.00)
GFR, Estimated: >60 mL/min (ref >60)
Glucose, Bld: 106 mg/dL — ABNORMAL HIGH (ref 70–99)
Potassium: 3.8 mmol/L (ref 3.5–5.1)
Sodium: 144 mmol/L (ref 135–145)

## 2023-08-22 LAB — ECHOCARDIOGRAM COMPLETE
Area-P 1/2: 3.21 cm2
Height: 60 in
S' Lateral: 2.2 cm
Weight: 1680 [oz_av]

## 2023-08-22 LAB — PHOSPHORUS: Phosphorus: 2.4 mg/dL — ABNORMAL LOW (ref 2.5–4.6)

## 2023-08-22 LAB — MAGNESIUM: Magnesium: 2 mg/dL (ref 1.7–2.4)

## 2023-08-22 LAB — GLUCOSE, CAPILLARY: Glucose-Capillary: 85 mg/dL (ref 70–99)

## 2023-08-22 LAB — TSH: TSH: 3.517 u[IU]/mL (ref 0.350–4.500)

## 2023-08-22 SURGERY — HEMIARTHROPLASTY, HIP, DIRECT ANTERIOR APPROACH, FOR FRACTURE
Anesthesia: Spinal | Site: Hip | Laterality: Right

## 2023-08-22 MED ORDER — SODIUM CHLORIDE 0.9 % IV SOLN
3.0000 g | Freq: Three times a day (TID) | INTRAVENOUS | Status: AC
  Administered 2023-08-22 – 2023-08-27 (×16): 3 g via INTRAVENOUS
  Filled 2023-08-22 (×16): qty 8

## 2023-08-22 MED ORDER — BUPIVACAINE LIPOSOME 1.3 % IJ SUSP
INTRAMUSCULAR | Status: DC | PRN
  Administered 2023-08-22: 20 mL

## 2023-08-22 MED ORDER — EPHEDRINE 5 MG/ML INJ
INTRAVENOUS | Status: AC
  Filled 2023-08-22: qty 5

## 2023-08-22 MED ORDER — EPHEDRINE SULFATE-NACL 50-0.9 MG/10ML-% IV SOSY
PREFILLED_SYRINGE | INTRAVENOUS | Status: DC | PRN
  Administered 2023-08-22 (×5): 10 mg via INTRAVENOUS
  Administered 2023-08-22: 5 mg via INTRAVENOUS

## 2023-08-22 MED ORDER — ISOPROPYL ALCOHOL 70 % SOLN
Status: DC | PRN
  Administered 2023-08-22: 1 via TOPICAL

## 2023-08-22 MED ORDER — CHLORHEXIDINE GLUCONATE 0.12 % MT SOLN
15.0000 mL | Freq: Once | OROMUCOSAL | Status: AC
  Administered 2023-08-22: 15 mL via OROMUCOSAL

## 2023-08-22 MED ORDER — ISOPROPYL ALCOHOL 70 % SOLN
Status: AC
  Filled 2023-08-22: qty 480

## 2023-08-22 MED ORDER — SODIUM CHLORIDE (PF) 0.9 % IJ SOLN
INTRAMUSCULAR | Status: AC
  Filled 2023-08-22: qty 50

## 2023-08-22 MED ORDER — AMIODARONE HCL IN DEXTROSE 360-4.14 MG/200ML-% IV SOLN
30.0000 mg/h | INTRAVENOUS | Status: DC
  Administered 2023-08-22 – 2023-08-23 (×3): 30 mg/h via INTRAVENOUS
  Filled 2023-08-22 (×2): qty 200

## 2023-08-22 MED ORDER — ENOXAPARIN SODIUM 30 MG/0.3ML IJ SOSY
30.0000 mg | PREFILLED_SYRINGE | INTRAMUSCULAR | Status: DC
  Administered 2023-08-23 – 2023-08-28 (×6): 30 mg via SUBCUTANEOUS
  Filled 2023-08-22 (×6): qty 0.3

## 2023-08-22 MED ORDER — LACTATED RINGERS IV SOLN: INTRAVENOUS | Status: AC

## 2023-08-22 MED ORDER — ORAL CARE MOUTH RINSE: 15.0000 mL | OROMUCOSAL | Status: DC | PRN

## 2023-08-22 MED ORDER — KETAMINE HCL 10 MG/ML IJ SOLN
INTRAMUSCULAR | Status: DC | PRN
  Administered 2023-08-22 (×2): 10 mg via INTRAVENOUS

## 2023-08-22 MED ORDER — SODIUM CHLORIDE (PF) 0.9 % IJ SOLN
INTRAMUSCULAR | Status: DC | PRN
  Administered 2023-08-22: 60 mL

## 2023-08-22 MED ORDER — SODIUM CHLORIDE (PF) 0.9 % IJ SOLN
INTRAMUSCULAR | Status: AC
  Filled 2023-08-22: qty 10

## 2023-08-22 MED ORDER — KETAMINE HCL 50 MG/5ML IJ SOSY
PREFILLED_SYRINGE | INTRAMUSCULAR | Status: AC
  Filled 2023-08-22: qty 5

## 2023-08-22 MED ORDER — PROPOFOL 10 MG/ML IV BOLUS
INTRAVENOUS | Status: AC
  Filled 2023-08-22: qty 20

## 2023-08-22 MED ORDER — PROPOFOL 10 MG/ML IV BOLUS
INTRAVENOUS | Status: DC | PRN
  Administered 2023-08-22: 20 mg via INTRAVENOUS
  Administered 2023-08-22: 10 mg via INTRAVENOUS
  Administered 2023-08-22: 25 ug/kg/min via INTRAVENOUS

## 2023-08-22 MED ORDER — OXYCODONE HCL 5 MG PO TABS
2.5000 mg | ORAL_TABLET | ORAL | Status: DC | PRN
  Administered 2023-08-25 – 2023-08-28 (×14): 5 mg via ORAL
  Filled 2023-08-22 (×14): qty 1

## 2023-08-22 MED ORDER — DILTIAZEM HCL 25 MG/5ML IV SOLN
10.0000 mg | Freq: Once | INTRAVENOUS | Status: AC
  Administered 2023-08-22: 10 mg via INTRAVENOUS
  Filled 2023-08-22: qty 5

## 2023-08-22 MED ORDER — 0.9 % SODIUM CHLORIDE (POUR BTL) OPTIME
TOPICAL | Status: DC | PRN
  Administered 2023-08-22: 1000 mL

## 2023-08-22 MED ORDER — CEFAZOLIN SODIUM-DEXTROSE 2-4 GM/100ML-% IV SOLN
2.0000 g | Freq: Three times a day (TID) | INTRAVENOUS | Status: AC
Start: 2023-08-22 — End: 2023-08-24
  Administered 2023-08-22 – 2023-08-23 (×2): 2 g via INTRAVENOUS
  Filled 2023-08-22 (×2): qty 100

## 2023-08-22 MED ORDER — AMIODARONE LOAD VIA INFUSION
150.0000 mg | Freq: Once | INTRAVENOUS | Status: AC
  Administered 2023-08-22: 150 mg via INTRAVENOUS
  Filled 2023-08-22: qty 83.34

## 2023-08-22 MED ORDER — BUPIVACAINE HCL (PF) 0.5 % IJ SOLN
INTRAMUSCULAR | Status: DC | PRN
  Administered 2023-08-22: 9 mg via INTRATHECAL

## 2023-08-22 MED ORDER — ENOXAPARIN SODIUM 40 MG/0.4ML IJ SOSY: 40.0000 mg | PREFILLED_SYRINGE | INTRAMUSCULAR | Status: DC

## 2023-08-22 MED ORDER — BUPIVACAINE LIPOSOME 1.3 % IJ SUSP
INTRAMUSCULAR | Status: AC
  Filled 2023-08-22: qty 20

## 2023-08-22 MED ORDER — WATER FOR IRRIGATION, STERILE IR SOLN
Status: DC | PRN
  Administered 2023-08-22: 1000 mL

## 2023-08-22 MED ORDER — AMIODARONE HCL IN DEXTROSE 360-4.14 MG/200ML-% IV SOLN
60.0000 mg/h | INTRAVENOUS | Status: AC
  Administered 2023-08-22 (×2): 60 mg/h via INTRAVENOUS
  Filled 2023-08-22 (×2): qty 200

## 2023-08-22 MED ORDER — LIDOCAINE HCL (PF) 2 % IJ SOLN
INTRAMUSCULAR | Status: AC
  Filled 2023-08-22: qty 5

## 2023-08-22 MED ORDER — SODIUM CHLORIDE 0.9 % IR SOLN
Status: DC | PRN
  Administered 2023-08-22: 3000 mL

## 2023-08-22 SURGICAL SUPPLY — 71 items
BAG COUNTER SPONGE SURGICOUNT (BAG) IMPLANT
BAG DECANTER FOR FLEXI CONT (MISCELLANEOUS) ×1 IMPLANT
BAG ZIPLOCK 12X15 (MISCELLANEOUS) ×1 IMPLANT
BLADE SAW SAG 25X90X1.19 (BLADE) ×1 IMPLANT
BRUSH FEMORAL CANAL (MISCELLANEOUS) IMPLANT
CEMENT BONE SIMPLEX SPEEDSET (Cement) IMPLANT
CEMENT RESTRICTOR BONE PREP ST (Cement) IMPLANT
CHLORAPREP W/TINT 26 (MISCELLANEOUS) ×2 IMPLANT
COVER SURGICAL LIGHT HANDLE (MISCELLANEOUS) ×1 IMPLANT
DERMABOND ADVANCED .7 DNX12 (GAUZE/BANDAGES/DRESSINGS) ×1 IMPLANT
DRAPE 3/4 80X56 (DRAPES) ×2 IMPLANT
DRAPE HIP W/POCKET STRL (MISCELLANEOUS) ×1 IMPLANT
DRAPE INCISE IOBAN 66X45 STRL (DRAPES) ×1 IMPLANT
DRAPE INCISE IOBAN 85X60 (DRAPES) ×1 IMPLANT
DRAPE POUCH INSTRU U-SHP 10X18 (DRAPES) ×1 IMPLANT
DRAPE SHEET LG 3/4 BI-LAMINATE (DRAPES) ×3 IMPLANT
DRAPE SURG 17X11 SM STRL (DRAPES) IMPLANT
DRAPE U-SHAPE 47X51 STRL (DRAPES) ×2 IMPLANT
DRESSING AQUACEL AG SP 3.5X10 (GAUZE/BANDAGES/DRESSINGS) ×1 IMPLANT
DRSG AQUACEL AG ADV 3.5X 6 (GAUZE/BANDAGES/DRESSINGS) ×1 IMPLANT
DRSG AQUACEL AG ADV 3.5X10 (GAUZE/BANDAGES/DRESSINGS) IMPLANT
DRSG AQUACEL AG SP 3.5X10 (GAUZE/BANDAGES/DRESSINGS) ×1 IMPLANT
ELECT BLADE TIP CTD 4 INCH (ELECTRODE) ×1 IMPLANT
ELECT REM PT RETURN 15FT ADLT (MISCELLANEOUS) ×1 IMPLANT
GAUZE SPONGE 4X4 12PLY STRL LF (GAUZE/BANDAGES/DRESSINGS) IMPLANT
GLOVE BIO SURGEON STRL SZ 6.5 (GLOVE) IMPLANT
GLOVE BIOGEL PI IND STRL 6.5 (GLOVE) IMPLANT
GLOVE BIOGEL PI IND STRL 8 (GLOVE) ×2 IMPLANT
GLOVE SURG LX STRL 8.0 MICRO (GLOVE) ×2 IMPLANT
GLOVE SURG ORTHO 8.0 STRL STRW (GLOVE) ×1 IMPLANT
GOWN STRL REUS W/ TWL XL LVL3 (GOWN DISPOSABLE) ×1 IMPLANT
HEAD BP UNV 41X26XHIP FEM (Orthopedic Implant) IMPLANT
HEAD FEM 0XOFST TPR 26X (Knees) IMPLANT
HOLDER FOLEY CATH W/STRAP (MISCELLANEOUS) ×1 IMPLANT
HOOD PEEL AWAY T7 (MISCELLANEOUS) ×3 IMPLANT
JET LAVAGE IRRISEPT WOUND (IRRIGATION / IRRIGATOR) IMPLANT
KIT BASIN OR (CUSTOM PROCEDURE TRAY) ×1 IMPLANT
KIT TURNOVER KIT A (KITS) IMPLANT
LAVAGE JET IRRISEPT WOUND (IRRIGATION / IRRIGATOR) IMPLANT
MANIFOLD NEPTUNE II (INSTRUMENTS) ×1 IMPLANT
MARKER SKIN DUAL TIP RULER LAB (MISCELLANEOUS) ×1 IMPLANT
NS IRRIG 1000ML POUR BTL (IV SOLUTION) ×1 IMPLANT
PACK TOTAL JOINT (CUSTOM PROCEDURE TRAY) ×1 IMPLANT
PAD ARMBOARD 7.5X6 YLW CONV (MISCELLANEOUS) IMPLANT
PROTECTOR NERVE ULNAR (MISCELLANEOUS) ×1 IMPLANT
RETRIEVER SUT HEWSON (MISCELLANEOUS) ×1 IMPLANT
SEALER BIPOLAR AQUA 6.0 (INSTRUMENTS) IMPLANT
SET HNDPC FAN SPRY TIP SCT (DISPOSABLE) ×1 IMPLANT
SET INTERPULSE LAVAGE W/TIP (ORTHOPEDIC DISPOSABLE SUPPLIES) ×1 IMPLANT
SLEEVE STOCKINETTE LIMB 4X8 (MISCELLANEOUS) ×1 IMPLANT
SLEEVE STOCKINETTE LIMB 6X9 (MISCELLANEOUS) ×1 IMPLANT
SPIKE FLUID TRANSFER (MISCELLANEOUS) ×3 IMPLANT
STAPLER SKIN PROX WIDE 3.9 (STAPLE) IMPLANT
STEM FEM CMT 43X131X35 SZ3 (Stem) IMPLANT
SUCTION TUBE FRAZIER 12FR DISP (SUCTIONS) ×1 IMPLANT
SUT BONE WAX W31G (SUTURE) ×1 IMPLANT
SUT ETHIBOND #5 BRAIDED 30INL (SUTURE) ×1 IMPLANT
SUT MNCRL AB 3-0 PS2 18 (SUTURE) ×1 IMPLANT
SUT STRATAFIX 0 PDS 27 VIOLET (SUTURE) ×1 IMPLANT
SUT STRATAFIX 14 PDO 48 VLT (SUTURE) ×1 IMPLANT
SUT STRATAFIX 1PDS 45CM VIOLET (SUTURE) ×1 IMPLANT
SUT STRATAFIX PDO 1 14 VIOLET (SUTURE) ×2 IMPLANT
SUT VIC AB 2-0 CT2 27 (SUTURE) ×2 IMPLANT
SUTURE STRATFX 0 PDS 27 VIOLET (SUTURE) ×1 IMPLANT
SYR 20ML LL LF (SYRINGE) ×1 IMPLANT
TOWEL OR 17X26 10 PK STRL BLUE (TOWEL DISPOSABLE) ×1 IMPLANT
TOWER CARTRIDGE SMART MIX (DISPOSABLE) ×1 IMPLANT
TRAY FOLEY MTR SLVR 16FR STAT (SET/KITS/TRAYS/PACK) ×1 IMPLANT
TUBE KAMVAC SUCTION (TUBING) IMPLANT
TUBE SUCTION HIGH CAP CLEAR NV (SUCTIONS) ×1 IMPLANT
WATER STERILE IRR 1000ML POUR (IV SOLUTION) ×2 IMPLANT

## 2023-08-22 NOTE — Anesthesia Procedure Notes (Addendum)
 Spinal  Patient location during procedure: OR Start time: 08/22/2023 1:10 PM End time: 08/22/2023 1:18 PM Reason for block: surgical anesthesia Staffing Performed: anesthesiologist  Anesthesiologist: Jefm Garnette LABOR, MD Performed by: Jefm Garnette LABOR, MD Authorized by: Jefm Garnette LABOR, MD   Preanesthetic Checklist Completed: patient identified, IV checked, risks and benefits discussed, surgical consent, monitors and equipment checked, pre-op evaluation and timeout performed Spinal Block Patient position: left lateral decubitus Prep: DuraPrep and site prepped and draped Patient monitoring: heart rate, cardiac monitor, continuous pulse ox and blood pressure Approach: midline Location: L3-4 Injection technique: single-shot Needle Needle type: Pencan  Needle gauge: 24 G Needle length: 10 cm Needle insertion depth: 4 cm Assessment Sensory level: T4 Events: CSF return Additional Notes  1 Attempt (s). Pt tolerated procedure well.

## 2023-08-22 NOTE — Op Note (Signed)
 08/22/2023  3:14 PM  PATIENT:  [name]   MRN: [id number]  PRE-OPERATIVE DIAGNOSIS:  RIGHT FEMORAL NECK FRACTURE  POST-OPERATIVE DIAGNOSIS:  RIGHT FEMORAL NECK FRACTURE  PROCEDURE:  Procedure(s): ARTHROPLASTY BIPOLAR HIP (HEMIARTHROPLASTY)  PREOPERATIVE INDICATIONS:  [name] is an 78 y.o. female who was admitted 08/20/2023 with a diagnosis of Femoral fracture (HCC) and elected for surgical management.  The risks benefits and alternatives were discussed with the patient including but not limited to the risks of nonoperative treatment, versus surgical intervention including infection, bleeding, nerve injury, periprosthetic fracture, the need for revision surgery, dislocation, leg length discrepancy, blood clots, cardiopulmonary complications, morbidity, mortality, among others, and they were willing to proceed.  Predicted outcome is good, although there will be at least a six to nine month expected recovery.   OPERATIVE REPORT     SURGEON:  Toribio Higashi, MD    ASSISTANT:  Bernarda Mclean, PA-C  (Present throughout the entire procedure,  necessary for completion of procedure in a timely manner, assisting with retraction, instrumentation, and closure)     ANESTHESIA:  spinal  ESTIMATED BLOOD LOSS: 150cc    COMPLICATIONS:  None.       COMPONENTS:  Stryker Accolade C size 3 stem with high offset, 41 x 26 bipolar Hemi head 26+0 cobalt chrome head ball Implant Name Type Inv. Item Serial No. Manufacturer Lot No. LRB No. Used Action  STEM FEM CMT [id number] SZ3 - [id number] Stem STEM FEM CMT [id number] SZ3  [employer] [id number] Right 1 Implanted  CEMENT RESTRICTOR BONE PREP ST - [id number] Cement CEMENT RESTRICTOR BONE PREP ST  STRYKER INSTRUMENTS [id number] Right 1 Implanted  CEMENT BONE SIMPLEX SPEEDSET - [id number] Cement CEMENT BONE SIMPLEX SPEEDSET  [employer] [id number] Right 1 Implanted  CEMENT BONE SIMPLEX SPEEDSET - [id number] Cement CEMENT BONE SIMPLEX SPEEDSET   [employer] [id number] Right 1 Implanted  HEAD FEM 0XOFST TPR 26X - [id number] Knees HEAD FEM 0XOFST TPR 26X  [employer] [id number] Right 1 Implanted  HEAD BP UNV [id number] FEM - [id number] Orthopedic Implant HEAD BP UNV [id number] FEM  [employer] [id number] Right 1 Implanted       PROCEDURE IN DETAIL: The patient was met in the holding area and identified.  The appropriate hip  was marked at the operative site. The patient was then transported to the OR and  placed under anesthesia.  At that point, the patient was  placed in the lateral decubitus position with the operative side up and  secured to the operating room table and all bony prominences padded. A subaxillary role was placed.    The operative lower extremity was prepped from the iliac crest to the ankle.  Sterile draping was performed.  2g of ancef  and 1g TXA were given prior to incision. Time out was performed prior to incision.      A routine posterolateral approach was utilized via sharp dissection  carried down to the subcutaneous tissue.  Gross bleeders were Bovie  coagulated.  The iliotibial band was identified and incised  along the length of the skin incision.  A Charnley retractor was inserted with care to protect the sciatic nerve.  With the hip internally rotated, the short external rotators  were identified. The piriformis was tagged with #5 Ethibond, and the hip capsule released in a T-type fashion, and posterior sleeve of the capsule was also tagged.  The femoral neck was exposed, and I resected the femoral neck using the appropriate jig. This was performed at approximately a  thumb's breadth above the lesser trochanter.    I then exposed the deep acetabulum, cleared out any tissue including the ligamentum teres.    I then prepared the proximal femur using the box cutter, Charnley awl, and then sequentially broached.  A trial utilized, and I reduced the hip, leg lengths were assessed clinically and felt to be  equal. The hip was then taken through a full range of motion, the hip was stable at full extension and 90 degrees external rotation without anterior subluxation. The hip was also stable in the position of sleep, and in neutral abduction up to 90 degrees flexion, and 90  degrees IR. The trial components were then removed.   We then prepared canal for cementation.  The cement restrictor was measured and inserted distally.  The canal was then irrigated with the pulse lavage and 3 L of normal saline.  2 bags of Simplex cement were prepared.  Using the cement gun the cement was inserted distally and the canal was filled.  We then pressurized the canal. The real implant was then inserted matching the patient's native anteversion of approximately 25 degrees.  We then waited for 13 minutes for the cement to be fully set.  Excess cement was removed.  A lap was placed in the acetabulum prior to cementing was also removed and the acetabulum was assessed to make sure there was no cement or bone fragments.  The hip was then reduced with the trial head again and taken through functional range of motion and found to have excellent stability. Leg lengths were restored. The real head was then impacted onto the stem and the hip was again reduced.  The capsule was then repaired with #5 Ethibond., and the piriformis was repaired to the abductor tendon. Excellent posterior capsular repair was achieved.   I then irrigated the hip copiously again with pulse lavage. The wounds were injected with 20cc exparal diluted in sterile saline. The fascia and IT band was repaired with #1 stratafix, followed by 0 stratafix for the fat layer followed by 2-0 Vicryl and running 3-0 Monocryl for the skin, Dermabond was applied and an Aquacel dressing was placed.  The patient was then awakened and returned to PACU in stable and satisfactory condition. There were no complications.  Post op recs: WB: WBAT   Abx: ancef  x23 hours post op Imaging:  PACU xrays Dressing: Aquacel dressing to be kept intact until follow-up DVT prophylaxis: lovenox  starting POD1 x4 weeks Follow up: 2 weeks after surgery for a wound check with Dr. Edna at [facility].  Address: [address], [place], [place] [address]  Office Phone: [phone]   Toribio Edna, MD Orthopedic Surgeon  08/22/2023 3:14 PM

## 2023-08-22 NOTE — Transfer of Care (Signed)
 Immediate Anesthesia Transfer of Care Note  Patient: [name]  Procedure(s) Performed: Procedure(s): ARTHROPLASTY BIPOLAR HIP (HEMIARTHROPLASTY) (Right)  Patient Location: PACU  Anesthesia Type:Spinal  Level of Consciousness:  sedated, patient cooperative and responds to stimulation  Airway & Oxygen Therapy:Patient Spontanous Breathing and Patient connected to face mask oxgen  Post-op Assessment:  Report given to PACU RN and Post -op Vital signs reviewed and stable  Post vital signs:  Reviewed and stable  Last Vitals:  Vitals:   08/22/23 1000 08/22/23 1249  BP: (!) 144/78 (!) 142/68  Pulse: 75 71  Resp:  16  Temp:  36.9 C  SpO2: 97%     Complications: No apparent anesthesia complications

## 2023-08-22 NOTE — H&P (View-Only) (Signed)
     Subjective:  Patient has been agitated overnight and did not sleep well. Family at bedside.  Doing better this morning more comfortable and calm.  Patient has dementia.   Objective:   VITALS:   Vitals:   08/22/23 0530 08/22/23 0545 08/22/23 0600 08/22/23 0615  BP: (!) 113/55 (!) 104/58 108/61 108/60  Pulse: 88 87 85 84  Resp: (!) 22 (!) 9 11 15   Temp:      TempSrc:      SpO2: 100% 96% 96% 94%  Weight:      Height:        Intact pulses distally Compartment soft Spontaneous ankle dorsi plantarflexion on the right side patient able to cooperate with formal motor and sensory exam   Lab Results  Component Value Date   WBC 12.0 (H) 08/21/2023   HGB 13.7 08/21/2023   HCT 43.4 08/21/2023   MCV 93.7 08/21/2023   PLT 304 08/21/2023   BMET    Component Value Date/Time   NA 144 08/21/2023 0300   K 4.7 08/21/2023 0300   CL 112 (H) 08/21/2023 0300   CO2 19 (L) 08/21/2023 0300   GLUCOSE 87 08/21/2023 0300   BUN 32 (H) 08/21/2023 0300   CREATININE 0.92 08/21/2023 0300   CALCIUM  8.1 (L) 08/21/2023 0300   GFRNONAA >60 08/21/2023 0300      Xray: CT scan reviewed demonstrates displaced right femoral neck fracture  Assessment/Plan:     Principal Problem:   Femoral fracture (HCC) Active Problems:   Dementia with behavioral disturbance (HCC)   Elevated LFTs   CKD (chronic kidney disease)   Influenza A  Right femoral neck fracture  Unfortunately fracture is displaced not amenable to conservative treatment.  Patient ultimately benefit from hip and knee arthroplasty even from a palliative standpoint as she is in significant pain from the fracture.  Patient has been medically optimized and family is willing to proceed with surgery.        Johaan Ryser A Nusaybah Ivie 08/22/2023, 7:51 AM   Toribio Higashi, MD  Contact information:   [phone] 7am-5pm epic message Dr. Higashi, or call office for patient follow up: [phone] After hours and holidays please  check [email or web address] for group call information for [facility]

## 2023-08-22 NOTE — Interval H&P Note (Signed)
 The patient has been re-examined, and the chart reviewed, and there have been no interval changes to the documented history and physical.    Plan for right hip hemiarthroplasty for femoral neck fracture  The operative side was examined and the patient was confirmed to have sensation to DPN, SPN, TN intact, Motor EHL, ext, flex 5/5, and DP 2+, PT 2+, No significant edema.   The risks, benefits, and alternatives have been discussed at length with patient, and the patient is willing to proceed.  Right hip marked. Consent has been signed.

## 2023-08-22 NOTE — Discharge Instructions (Signed)
 INSTRUCTIONS AFTER JOINT REPLACEMENT   Remove items at home which could result in a fall. This includes throw rugs or furniture in walking pathways ICE to the affected joint every three hours while awake for 30 minutes at a time, for at least the first 3-5 days, and then as needed for pain and swelling.  Continue to use ice for pain and swelling. You may notice swelling that will progress down to the foot and ankle.  This is normal after surgery.  Elevate your leg when you are not up walking on it.   Continue to use the breathing machine you got in the hospital (incentive spirometer) which will help keep your temperature down.  It is common for your temperature to cycle up and down following surgery, especially at night when you are not up moving around and exerting yourself.  The breathing machine keeps your lungs expanded and your temperature down.  DIET:  As you were doing prior to hospitalization, we recommend a well-balanced diet.  DRESSING / WOUND CARE / SHOWERING:  Keep the surgical dressing until follow up.  The dressing is water  proof, so you can shower without any extra covering.  IF THE DRESSING FALLS OFF or the wound gets wet inside, change the dressing with sterile gauze.  Please use good hand washing techniques before changing the dressing.  Do not use any lotions or creams on the incision until instructed by your surgeon.    ACTIVITY  Increase activity slowly as tolerated, but follow the weight bearing instructions below.   No driving for 6 weeks or until further direction given by your physician.  You cannot drive while taking narcotics.  No lifting or carrying greater than 10 lbs. until further directed by your surgeon. Avoid periods of inactivity such as sitting longer than an hour when not asleep. This helps prevent blood clots.  You may return to work once you are authorized by your doctor.   WEIGHT BEARING: Weight bearing as tolerated with assist device (walker, cane, etc) as  directed, use it as long as suggested by your surgeon or therapist, typically at least 4-6 weeks.  EXERCISES  Results after joint replacement surgery are often greatly improved when you follow the exercise, range of motion and muscle strengthening exercises prescribed by your doctor. Safety measures are also important to protect the joint from further injury. Any time any of these exercises cause you to have increased pain or swelling, decrease what you are doing until you are comfortable again and then slowly increase them. If you have problems or questions, call your caregiver or physical therapist for advice.   Rehabilitation is important following a joint replacement. After just a few days of immobilization, the muscles of the leg can become weakened and shrink (atrophy).  These exercises are designed to build up the tone and strength of the thigh and leg muscles and to improve motion. Often times heat used for twenty to thirty minutes before working out will loosen up your tissues and help with improving the range of motion but do not use heat for the first two weeks following surgery (sometimes heat can increase post-operative swelling).   These exercises can be done on a training (exercise) mat, on the floor, on a table or on a bed. Use whatever works the best and is most comfortable for you.    Use music or television while you are exercising so that the exercises are a pleasant break in your day. This will make your life  better with the exercises acting as a break in your routine that you can look forward to.   Perform all exercises about fifteen times, three times per day or as directed.  You should exercise both the operative leg and the other leg as well.  Exercises include:   Quad Sets - Tighten up the muscle on the front of the thigh (Quad) and hold for 5-10 seconds.   Straight Leg Raises - With your knee straight (if you were given a brace, keep it on), lift the leg to 60 degrees, hold  for 3 seconds, and slowly lower the leg.  Perform this exercise against resistance later as your leg gets stronger.  Leg Slides: Lying on your back, slowly slide your foot toward your buttocks, bending your knee up off the floor (only go as far as is comfortable). Then slowly slide your foot back down until your leg is flat on the floor again.  Angel Wings: Lying on your back spread your legs to the side as far apart as you can without causing discomfort.  Hamstring Strength:  Lying on your back, push your heel against the floor with your leg straight by tightening up the muscles of your buttocks.  Repeat, but this time bend your knee to a comfortable angle, and push your heel against the floor.  You may put a pillow under the heel to make it more comfortable if necessary.   A rehabilitation program following joint replacement surgery can speed recovery and prevent re-injury in the future due to weakened muscles. Contact your doctor or a physical therapist for more information on knee rehabilitation.   CONSTIPATION:  Constipation is defined medically as fewer than three stools per week and severe constipation as less than one stool per week.  Even if you have a regular bowel pattern at home, your normal regimen is likely to be disrupted due to multiple reasons following surgery.  Combination of anesthesia, postoperative narcotics, change in appetite and fluid intake all can affect your bowels.   YOU MUST use at least one of the following options; they are listed in order of increasing strength to get the job done.  They are all available over the counter, and you may need to use some, POSSIBLY even all of these options:    Drink plenty of fluids (prune juice may be helpful) and high fiber foods Colace 100 mg by mouth twice a day  Senokot for constipation as directed and as needed Dulcolax (bisacodyl ), take with full glass of water   Miralax  (polyethylene glycol) once or twice a day as needed.  If you  have tried all these things and are unable to have a bowel movement in the first 3-4 days after surgery call either your surgeon or your primary doctor.    If you experience loose stools or diarrhea, hold the medications until you stool forms back up.  If your symptoms do not get better within 1 week or if they get worse, check with your doctor.  If you experience the worst abdominal pain ever or develop nausea or vomiting, please contact the office immediately for further recommendations for treatment.  ITCHING:  If you experience itching with your medications, try taking only a single pain pill, or even half a pain pill at a time.  You can also use Benadryl  over the counter for itching or also to help with sleep.   TED HOSE STOCKINGS:  Use stockings on both legs until for at least 2 weeks or  as directed by physician office. They may be removed at night for sleeping.  MEDICATIONS:  See your medication summary on the "After Visit Summary" that nursing will review with you.  You may have some home medications which will be placed on hold until you complete the course of blood thinner medication.  It is important for you to complete the blood thinner medication as prescribed.  Blood clot prevention (DVT Prophylaxis): After surgery you are at an increased risk for a blood clot. You were prescribed a blood thinner, Lovenox , to be taken daily for a total of 4 weeks from surgery to help reduce your risk of getting a blood clot.  Signs of a pulmonary embolus (blood clot in the lungs) include sudden short of breath, feeling lightheaded or dizzy, chest pain with a deep breath, rapid pulse rapid breathing.  Signs of a blood clot in your arms or legs include new unexplained swelling and cramping, warm, red or darkened skin around the painful area.  Please call the office or 911 right away if these signs or symptoms develop.  PRECAUTIONS:   If you experience chest pain or shortness of breath - call 911  immediately for transfer to the hospital emergency department.   If you develop a fever greater that 101 F, purulent drainage from wound, increased redness or drainage from wound, foul odor from the wound/dressing, or calf pain - CONTACT YOUR SURGEON.                                                   FOLLOW-UP APPOINTMENTS:  If you do not already have a post-op appointment, please call the office for an appointment to be seen by your surgeon.  Guidelines for how soon to be seen are listed in your "After Visit Summary", but are typically between 2-3 weeks after surgery.  If you have a specialized bandage, you may be told to follow up 1 week after surgery.  POST-OPERATIVE OPIOID TAPER INSTRUCTIONS: It is important to wean off of your opioid medication as soon as possible. If you do not need pain medication after your surgery it is ok to stop day one. Opioids include: Codeine, Hydrocodone (Norco, Vicodin), Oxycodone (Percocet, oxycontin ) and hydromorphone  amongst others.  Long term and even short term use of opiods can cause: Increased pain response Dependence Constipation Depression Respiratory depression And more.  Withdrawal symptoms can include Flu like symptoms Nausea, vomiting And more Techniques to manage these symptoms Hydrate well Eat regular healthy meals Stay active Use relaxation techniques(deep breathing, meditating, yoga) Do Not substitute Alcohol  to help with tapering If you have been on opioids for less than two weeks and do not have pain than it is ok to stop all together.  Plan to wean off of opioids This plan should start within one week post op of your joint replacement. Maintain the same interval or time between taking each dose and first decrease the dose.  Cut the total daily intake of opioids by one tablet each day Next start to increase the time between doses. The last dose that should be eliminated is the evening dose.   MAKE SURE YOU:  Understand these  instructions.  Get help right away if you are not doing well or get worse.    Thank you for letting us  be a part of your medical care team.  It  is a privilege we respect greatly.  We hope these instructions will help you stay on track for a fast and full recovery!

## 2023-08-22 NOTE — Progress Notes (Signed)
 [facility] Liaison Note?     [name] is a current [facility] hospice patient with a terminal diagnosis of Alzheimer's Disease with early onset. Patient's husband reported that he was feeding patient breakfast and at the end of the meal, patient threw her head back and her eyes rolled back in her head. [facility] nurse arrived for PRN visit, but EMS had already been called and was on sight. Per husband's request, patient was transferred to the ED to be checked out. Family also reports recent change in mental status post fall at home. Patient was admitted on 2.5.25 with concerns for Influenza A and closed fracture of the right hip. Per Dr. Norleen Ballard with [facility] this is a related hospital admission.    Patient was taken to surgery at time of visit. Spoke with husband by phone later in the day and her reports that surgery went well.    Patient is inpatient appropriate due to possible surgical intervention and IV antibiotics.   Vital Signs:?98.6/69/16    130/66    100% on 8LPM   I/O:  1772.6/400   Abnormal labs: Chloride 112, Calcium  7.8, Glucose 106, Phosphorus 2.4, WBC 10.6, Hgb 11.5  Diagnostics: DG hip unilat w or w/o pelvis 2-3 view right - post op  IV/PRN Meds: Dilaudid  0.5 mg IV x5, Haldol  0.5 mg IV x3, lactated ringers  10 mL/hr cont., Unasyn  3g IV x1, Ancef  2g IV x1, IV Amiodarone  30 mg/hr cont.   Problem list per MD note [name]., MD 2.7.25: Assessment & Plan:   Principal Problem:   Femoral fracture (HCC) Active Problems:   Dementia with behavioral disturbance (HCC)   Elevated LFTs   CKD (chronic kidney disease)   Influenza A   Goals of care Recommended to son/daughter they reconsider DNR status.  They'll need to discuss this with Mr. Awtrey (their father, patient's husband).  She's moderate to high cardiac risk.  At significant risk for post operative delirium.  Discussed with family.    Acute Metabolic  Encephalopathy Advanced Dementia At baseline walks without assistive device, but difficult to understand, rarely will say a simple phrase More confused than baseline, suspect delirium in setting of pain, hospitalization, etc Delirium precautions    Right femoral neck fracture with history of recurrent falls At her baseline was walking without assistive device, has been nonambulatory since her fall CT with subacute fx of R femoral neck with substantial displacement and varus angulation Orthopedics consulting, planning for surgery today Risks have been previously discussed, I reiterated risk today, including discussion of the possibility of post op delirium  Post op dvt ppx per ortho Continue pain meds, bowel regimen    SVT Suspect atrial fibrillation with RVR Rates improved after amiodarone  TSH wnl Will discuss anticoagulation post op   Elevated troponins Cardiology suspects myocardial injury in the setting of her recent femur fracture Echo done today is without regional wall motion abnormalities, preserved EF Heparin  gtt d/c'd per cards Per cards, may proceed with moderate to high cardiac risk - increased risk for future morbidity mortality   Elevated CK levels.  downtrending   Influenza A Concern for Possible Superimposed Pneumonia CT chest with nodular and branching opacities in the LUL favoring atypical infectious bronchiolitis, bronchitis or reactive airways disease Tamiflu  Unasyn  for possible pneumonia Blood cultures SLP eval    CKD (chronic kidney disease) Creatinine improved today   Elevated LFTs Likely secondary to elevated CK levels.  Continue to monitor.   Discharge  Planning:? Discharge back home with hospice services once medically stable   Family contact: talked with patient's husband by phone   IDT:? Updated?     Goals of Care: full code  If patient requires EMS transport at discharge, please us  [facility] as that is who [facility] is contracted with for  transport.   Please call with any hospice related questions or concerns.   [facility] hospital liaison [phone]

## 2023-08-22 NOTE — Progress Notes (Signed)
    Patient Name: [name]           DOB: 1945/07/20  MRN: [id number]      Admission Date: 08/20/2023  Attending Provider: Sonjia Held, MD  Primary Diagnosis: Femoral fracture [facility])   Level of care: Progressive    CROSS COVER NOTE   Date of Service   08/22/2023   [name], 78 y.o. female, was admitted on 08/20/2023 for Femoral fracture [facility]).    HPI/Events of Note   Atrial Fibrillation with RVR; HR 160-190's Patient is unable to verbally communicate any discomfort due to significant dementia. Currently does not appear to be in pain or acute distress. Hemodynamically stable.  10 mg IV Cardizem  was trialed.  After cardizem  HR 160s, SBP 90-100s.  Given soft BP, amiodarone  was started.   Addendum-  0440- HR sustaining 90's, appears to transitioning to NSR   Interventions/ Plan   EKG  Cardiac Telemetry IV Cardizem , 10 mg push Amiodarone  150 mg IV bolus, then gtt  CXR Labs --> BMP, Mag, phosp, TSH

## 2023-08-22 NOTE — Anesthesia Postprocedure Evaluation (Signed)
 Anesthesia Post Note  Patient: [name]  Procedure(s) Performed: ARTHROPLASTY BIPOLAR HIP (HEMIARTHROPLASTY) (Right: Hip)     Patient location during evaluation: Nursing Unit Anesthesia Type: Spinal Level of consciousness: oriented and awake and alert Pain management: pain level controlled Vital Signs Assessment: post-procedure vital signs reviewed and stable Respiratory status: spontaneous breathing and respiratory function stable Cardiovascular status: blood pressure returned to baseline and stable Postop Assessment: no headache, no backache, no apparent nausea or vomiting and patient able to bend at knees Anesthetic complications: no   No notable events documented.  Last Vitals:  Vitals:   08/22/23 1508 08/22/23 1515  BP: 132/85 130/66  Pulse: 68 69  Resp: 20 16  Temp: 36.7 C   SpO2: 100% 100%    Last Pain:  Vitals:   08/22/23 1249  TempSrc: Temporal  PainSc:                  [name]

## 2023-08-22 NOTE — Progress Notes (Signed)
     Subjective:  Patient has been agitated overnight and did not sleep well. Family at bedside.  Doing better this morning more comfortable and calm.  Patient has dementia.   Objective:   VITALS:   Vitals:   08/22/23 0530 08/22/23 0545 08/22/23 0600 08/22/23 0615  BP: (!) 113/55 (!) 104/58 108/61 108/60  Pulse: 88 87 85 84  Resp: (!) 22 (!) 9 11 15   Temp:      TempSrc:      SpO2: 100% 96% 96% 94%  Weight:      Height:        Intact pulses distally Compartment soft Spontaneous ankle dorsi plantarflexion on the right side patient able to cooperate with formal motor and sensory exam   Lab Results  Component Value Date   WBC 12.0 (H) 08/21/2023   HGB 13.7 08/21/2023   HCT 43.4 08/21/2023   MCV 93.7 08/21/2023   PLT 304 08/21/2023   BMET    Component Value Date/Time   NA 144 08/21/2023 0300   K 4.7 08/21/2023 0300   CL 112 (H) 08/21/2023 0300   CO2 19 (L) 08/21/2023 0300   GLUCOSE 87 08/21/2023 0300   BUN 32 (H) 08/21/2023 0300   CREATININE 0.92 08/21/2023 0300   CALCIUM  8.1 (L) 08/21/2023 0300   GFRNONAA >60 08/21/2023 0300      Xray: CT scan reviewed demonstrates displaced right femoral neck fracture  Assessment/Plan:     Principal Problem:   Femoral fracture (HCC) Active Problems:   Dementia with behavioral disturbance (HCC)   Elevated LFTs   CKD (chronic kidney disease)   Influenza A  Right femoral neck fracture  Unfortunately fracture is displaced not amenable to conservative treatment.  Patient ultimately benefit from hip and knee arthroplasty even from a palliative standpoint as she is in significant pain from the fracture.  Patient has been medically optimized and family is willing to proceed with surgery.        Johaan Ryser A Nusaybah Ivie 08/22/2023, 7:51 AM   Toribio Higashi, MD  Contact information:   [phone] 7am-5pm epic message Dr. Higashi, or call office for patient follow up: [phone] After hours and holidays please  check [email or web address] for group call information for [facility]

## 2023-08-22 NOTE — Progress Notes (Signed)
 Pharmacy Antibiotic Note  Denisha Hoel is a 78 y.o. female admitted on 08/20/2023 with concern for aspiration pneumonia . Pharmacy has been consulted for Unasyn  dosing.  Patient presented with right femoral neck fracture and flu symptoms. Found to be flu positive.  Plan: -Change Unasyn  to 3 g IV q8h -Pharmacy to sign off consult but will continue to monitor renal function, cultures and clinical progress for dose adjustments and de-escalation as indicated  Height: 5' (152.4 cm) Weight: 47.6 kg (105 lb) IBW/kg (Calculated) : 45.5  Temp (24hrs), Avg:98.1 F (36.7 C), Min:97.8 F (36.6 C), Max:98.5 F (36.9 C)  Recent Labs  Lab 08/20/23 1502 08/20/23 2026 08/21/23 0300  WBC 13.0* 9.6 12.0*  CREATININE 1.15* 0.91 0.92  LATICACIDVEN 2.4*  --   --     Estimated Creatinine Clearance: 35.6 mL/min (by C-G formula based on SCr of 0.92 mg/dL).    Allergies  Allergen Reactions   Aricept  [Donepezil ] Diarrhea   Sulfa Antibiotics Hives   Wellbutrin [Bupropion] Other (See Comments)    Body aches     Antimicrobials this admission: Unasyn  2/5 >>  Dose adjustments this admission: N/a  Microbiology results: 2/5 BCx: ngtd   Thank you for allowing pharmacy to be a part of this patient's care.  Stefano MARLA Bologna, PharmD, BCPS Clinical Pharmacist 08/22/2023 7:33 AM

## 2023-08-22 NOTE — Significant Event (Signed)
 Rapid Response Event Note   Reason for Call :  Tachycardia reported by telemetry monitor tech to bedside RN.  Initial Focused Assessment:  Patient is alert, but unable to determine orientation due to known dementia. Patient admitted due to femoral fracture and Flu A +. Patient has received medication for pain and agitation according to [facility] documentation. Diminished lung sounds with possible rhonchi. Heart rate >170. Patient is restless and per husband has not slept for extended period (unsure how long.)  Interventions:  CBG, EKG, VS, CXR completed. Provider at bedside. Patient appears to be in A-fib RVR per provider interpretation of EKG. Cardizem  10 mg IV push given without reduction of heart rate. Amiodarone  60mg  drip started with bolus dose of 150mg . Labs ordered: CBC, Magnesium , BMP, TSH, Phosphorus. Husband currently refusing lab draw since phlebotomy would have to stick her vein and did not understand education from provider, bedside RN, or writer that drawing from infusing IV was inappropriate and results would be inaccurate. Phlebotomy will re-attempt labs later this morning.  Plan of Care:  Continue Amiodarone  drip as prescribed.  Follow up on diagnostic testing as soon as available. As of 0445, Heart rate in the 90's and patient will maintain current level of care.  Event Summary:   MD Notified: A. Andrez, NP Call Time: 0300 Arrival Time: 0305 End Time: 0445  Alan LITTIE Portugal, RN

## 2023-08-22 NOTE — Consult Note (Signed)
 Consultation Note Date: 08/22/2023   Patient Name: [name]  DOB: 1946-04-22  MRN: [id number]  Age / Sex: 78 y.o., female  PCP: Arloa Elsie SAUNDERS, MD Referring Physician: [name]., *  Reason for Consultation: Establishing goals of care  HPI/Patient Profile: 78 y.o. female  admitted on 08/20/2023   Clinical Assessment and Goals of Care: 78 year old lady with a past medical history significant for dementia and chronic kidney disease.  The patient had a fall a week and a half ago and was brought to urgent care center because she was not able to be mobile since her fall.  She has also been having cough for a week.  Patient lives at home with husband.  Son and daughter live out of town but are currently present at the bedside.  Within the past week, patient was enrolled in hospice services and had a 1 hospice visit.  The patient was brought into the emergency department because of ongoing lack of mobility and cough and weakness.  She was found to have right femoral neck fracture and was also positive for influenza A.  Patient's family elected to resend to DO NOT RESUSCITATE, she was made a full code, hospice services were rescinded and orthopedics was consulted and patient was admitted to hospital medicine service.  Palliative consult for ongoing goals of care discussions has been requested. Chart reviewed, patient seen and examined, met with patient with the patient's husband son and daughter present at bedside. Palliative medicine is specialized medical care for people living with serious illness. It focuses on providing relief from the symptoms and stress of a serious illness. The goal is to improve quality of life for both the patient and the family. Goals of care: Broad aims of medical therapy in relation to the patient's values and preferences. Our aim is to provide medical care aimed at enabling  patients to achieve the goals that matter most to them, given the circumstances of their particular medical situation and their constraints.   Patient's family is able to provide a review of her overall condition.  They stated that up until the last week and a half ago she was ambulatory.  She has never used a cane or a walker.  She was able to feed herself.  She has had mild functional decline since fall of last year after a COVID infection.  However, she was ambulatory up until very recently.  Patient's family states that their goals for her are to undergo surgery for hip fracture, to undergo treatment for her recent flu infection and then they will evaluate her overall condition and decide on further goals of care.  For now, full code full scope care.  They are aware of irreversible nature of her dementia however, at this time, they feel that addition of hospice services last week was premature.  NEXT OF KIN  Husband, son and daughter present at the bedside.   SUMMARY OF RECOMMENDATIONS   Full code full scope care family desiring continuation of current scope of  care, treating reversible conditions, attempting surgical intervention for hip fracture and completing treatment for current influenza infection.  Gently explained differences between hospice and palliative today at the time of initial palliative consultation.  Monitor hospital course and palliative services will continue to follow along. Thank you for the consult.  Code Status/Advance Care Planning: Full code   Symptom Management:     Palliative Prophylaxis:  Delirium Protocol  Additional Recommendations (Limitations, Scope, Preferences): Full Scope Treatment  Psycho-social/Spiritual:  Desire for further Chaplaincy support:yes Additional Recommendations: Caregiving  Support/Resources  Prognosis:  Unable to determine  Discharge Planning: To Be Determined      Primary Diagnoses: Present on Admission:  Femoral fracture  (HCC)   I have reviewed the medical record, interviewed the patient and family, and examined the patient. The following aspects are pertinent.  Past Medical History:  Diagnosis Date   Anxiety    Arthritis    lower back & knees   Back pain    Cataracts, bilateral    Depression    takes Citalopram  daily   Encephalitis 1973   Headache(784.0)    occasionally from a fall a month ago   Hearing loss    History of blood transfusion    no abnormal reaction noted   Hypertension    Insomnia    Joint pain    Joint swelling    Memory loss    Nocturia    Osteoarthritis    Rosacea    Social History   Socioeconomic History   Marital status: Married    Spouse name: Not on file   Number of children: 2   Years of education: College   Highest education level: Not on file  Occupational History   Occupation: Retired  Tobacco Use   Smoking status: Never   Smokeless tobacco: Never  Vaping Use   Vaping status: Never Used  Substance and Sexual Activity   Alcohol  use: No    Alcohol /week: 0.0 standard drinks of alcohol    Drug use: No   Sexual activity: Yes    Partners: Male    Birth control/protection: Post-menopausal  Other Topics Concern   Not on file  Social History Narrative   Lives at home with her husband.   Left-handed.   No caffeine per day.   Social Drivers of Corporate Investment Banker Strain: Not on file  Food Insecurity: Patient Unable To Answer (08/21/2023)   Hunger Vital Sign    Worried About Running Out of Food in the Last Year: Patient unable to answer    Ran Out of Food in the Last Year: Patient unable to answer  Transportation Needs: Patient Unable To Answer (08/21/2023)   PRAPARE - Transportation    Lack of Transportation (Medical): Patient unable to answer    Lack of Transportation (Non-Medical): Patient unable to answer  Physical Activity: Not on file  Stress: Not on file  Social Connections: Patient Unable To Answer (08/21/2023)   Social Connection and  Isolation Panel [NHANES]    Frequency of Communication with Friends and Family: Patient unable to answer    Frequency of Social Gatherings with Friends and Family: Patient unable to answer    Attends Religious Services: Patient unable to answer    Active Member of Clubs or Organizations: Patient unable to answer    Attends Banker Meetings: Patient unable to answer    Marital Status: Patient unable to answer   Family History  Problem Relation Age of Onset   Pancreatic cancer  Mother    Alcohol  abuse Father    Scheduled Meds:  acetaminophen   650 mg Oral Q4H   atorvastatin   40 mg Oral Daily   chlorhexidine   60 mL Topical Once   oseltamivir   30 mg Oral BID   povidone-iodine   2 Application Topical Once   senna-docusate  2 tablet Oral BID   sodium chloride  flush  3 mL Intravenous Q12H   Continuous Infusions:  amiodarone  30 mg/hr (08/22/23 0934)   ampicillin -sulbactam (UNASYN ) IV 3 g (08/22/23 0908)    ceFAZolin  (ANCEF ) IV     lactated ringers  Stopped (08/22/23 0311)   tranexamic acid      PRN Meds:.acetaminophen  **OR** acetaminophen , haloperidol  lactate, HYDROmorphone  (DILAUDID ) injection, mouth rinse, polyethylene glycol, QUEtiapine  Medications Prior to Admission:  Prior to Admission medications   Medication Sig Start Date End Date Taking? Authorizing Provider  Multiple Vitamins-Minerals (CENTRUM SILVER 50+WOMEN PO) Take 1 Dose by mouth daily. Patient not taking: Reported on 08/20/2023    [provider]   Allergies  Allergen Reactions   Aricept  [Donepezil ] Diarrhea   Sulfa Antibiotics Hives   Wellbutrin [Bupropion] Other (See Comments)    Body aches    Review of Systems +confused Physical Exam Elderly lady resting in bed, awake alert, does not verbalize, repeats words that are asked of her, wearing mittens Regular work of breathing Mild generalized discomfort evident Appears chronically ill and deconditioned and thinly built  Vital Signs: BP (!)  144/78   Pulse 75   Temp 98 F (36.7 C)   Resp 15   Ht 5' (1.524 m)   Wt 47.6 kg   SpO2 97%   BMI 20.51 kg/m  Pain Scale: PAINAD   Pain Score: Asleep   SpO2: SpO2: 97 % O2 Device:SpO2: 97 % O2 Flow Rate: .O2 Flow Rate (L/min): 2 L/min  IO: Intake/output summary:  Intake/Output Summary (Last 24 hours) at 08/22/2023 1117 Last data filed at 08/22/2023 1042 Gross per 24 hour  Intake 1971.09 ml  Output 650 ml  Net 1321.09 ml    LBM: Last BM Date : 08/21/23 Baseline Weight: Weight: 47.6 kg Most recent weight: Weight: 47.6 kg     Palliative Assessment/Data:   PPS 40%  Time In:  10 Time Out:  11.20 Time Total:  80 Greater than 50%  of this time was spent counseling and coordinating care related to the above assessment and plan.  Signed by: Lonia Serve, MD   Please contact Palliative Medicine Team phone at [phone] for questions and concerns.  For individual provider: See Tracey

## 2023-08-22 NOTE — Progress Notes (Signed)
 PROGRESS NOTE    [name]  FMW:[id number] DOB: 1946-06-22 DOA: 08/20/2023 PCP: Arloa Elsie SAUNDERS, MD  Chief Complaint  Patient presents with   FLU  SX   Weakness   Urinary Frequency    Brief Narrative:   Mikayela Ballard is [name] 78 y.o. female with medical history significant of dementia, CKD presented to the hospital after [name] fall that was [name] week back when she had gone to urgent care center and since then she has not been mobile.  She was also having cough for Termaine Roupp week and had sick contact with her husband.  At home at some point hospice was engaged but then family wished to have second opinion in the ED.  In the ED, CT scan of the abdomen and pelvis was done which revealed right femoral neck fracture.  Family rescinded DO NOT RESUSCITATE order and Ortho was consulted.   Assessment & Plan:   Principal Problem:   Femoral fracture (HCC) Active Problems:   Dementia with behavioral disturbance (HCC)   Elevated LFTs   CKD (chronic kidney disease)   Influenza Brodi Kari  Goals of care Recommended to son/daughter they reconsider DNR status.  They'll need to discuss this with [name] (their father, patient's husband).  She's moderate to high cardiac risk.  At significant risk for post operative delirium.  Discussed with family.   Acute Metabolic Encephalopathy Advanced Dementia At baseline walks without assistive device, but difficult to understand, rarely will say [name] simple phrase More confused than baseline, suspect delirium in setting of pain, hospitalization, etc Delirium precautions   Right femoral neck fracture with history of recurrent falls At her baseline was walking without assistive device, has been nonambulatory since her fall CT with subacute fx of R femoral neck with substantial displacement and varus angulation Orthopedics consulting, planning for surgery today Risks have been previously discussed, I reiterated risk today, including discussion of the possibility of post op  delirium  Post op dvt ppx per ortho Continue pain meds, bowel regimen   SVT Suspect atrial fibrillation with RVR Rates improved after amiodarone  TSH wnl Will discuss anticoagulation post op  Elevated troponins Cardiology suspects myocardial injury in the setting of her recent femur fracture Echo done today is without regional wall motion abnormalities, preserved EF Heparin  gtt d/c'd per cards Per cards, may proceed with moderate to high cardiac risk - increased risk for future morbidity mortality   Elevated CK levels.  downtrending   Influenza [name] Concern for Possible Superimposed Pneumonia CT chest with nodular and branching opacities in the LUL favoring atypical infectious bronchiolitis, bronchitis or reactive airways disease Tamiflu  Unasyn  for possible pneumonia Blood cultures SLP eval    CKD (chronic kidney disease) Creatinine improved today   Elevated LFTs Likely secondary to elevated CK levels.  Continue to monitor.    DVT prophylaxis: SCD Code Status: full Family Communication: husband, son, daughter Disposition:   Status is: Inpatient Remains inpatient appropriate because: need for inpt care   Consultants:  Orthopedics cardiology  Procedures:  Echo IMPRESSIONS     1. Left ventricular ejection fraction, by estimation, is 60 to 65%. The  left ventricle has normal function. The left ventricle has no regional  wall motion abnormalities. Left ventricular diastolic parameters were  normal.   2. Right ventricular systolic function is normal. The right ventricular  size is normal.   3. The mitral valve is normal in structure. Mild mitral valve  regurgitation. No evidence of mitral stenosis.   4. The aortic valve  is normal in structure. Aortic valve regurgitation is  mild. No aortic stenosis is present.   5. The inferior vena cava is normal in size with greater than 50%  respiratory variability, suggesting right atrial pressure of 3 mmHg.   Antimicrobials:   Anti-infectives (From admission, onward)    Start     Dose/Rate Route Frequency Ordered Stop   08/22/23 0800  [MAR Hold]  Ampicillin -Sulbactam (UNASYN ) 3 g in sodium chloride  0.9 % 100 mL IVPB        (MAR Hold since Fri 08/22/2023 at 1229.Hold Reason: Transfer to [name] Procedural area)   3 g 200 mL/hr over 30 Minutes Intravenous Every 8 hours 08/22/23 0732     08/22/23 0600  ceFAZolin  (ANCEF ) IVPB 2g/100 mL premix        2 g 200 mL/hr over 30 Minutes Intravenous On call to O.R. 08/21/23 1841 08/23/23 0559   08/20/23 2300  [MAR Hold]  oseltamivir  (TAMIFLU ) capsule 30 mg        (MAR Hold since Fri 08/22/2023 at 1229.Hold Reason: Transfer to [name] Procedural area)   30 mg Oral 2 times daily 08/20/23 2250 08/25/23 2159   08/20/23 2200  Ampicillin -Sulbactam (UNASYN ) 3 g in sodium chloride  0.9 % 100 mL IVPB  Status:  Discontinued        3 g 200 mL/hr over 30 Minutes Intravenous Every 12 hours 08/20/23 2159 08/22/23 0732       Subjective: Confused Discussed with family (husband, son, daughter)  Objective: Vitals:   08/22/23 0600 08/22/23 0615 08/22/23 0822 08/22/23 1000  BP: 108/61 108/60  (!) 144/78  Pulse: 85 84  75  Resp: 11 15    Temp:   98 F (36.7 C)   TempSrc:      SpO2: 96% 94%  97%  Weight:      Height:        Intake/Output Summary (Last 24 hours) at 08/22/2023 1242 Last data filed at 08/22/2023 1042 Gross per 24 hour  Intake 1871.09 ml  Output 650 ml  Net 1221.09 ml   Filed Weights   08/20/23 1358  Weight: 47.6 kg    Examination:  General exam: delirious Respiratory system: unlabored Cardiovascular system: RRR Gastrointestinal system: Abdomen is nondistended, soft and nontender. Central nervous system: Alert, but delirious  Extremities: no LEE   Data Reviewed: I have personally reviewed following labs and imaging studies  CBC: Recent Labs  Lab 08/20/23 1502 08/20/23 2026 08/21/23 0300 08/22/23 1041  WBC 13.0* 9.6 12.0* 10.6*  NEUTROABS 10.7*  --   --   --    HGB 14.4 13.7 13.7 11.5*  HCT 43.6 42.9 43.4 36.8  MCV 90.5 92.7 93.7 94.4  PLT 335 240 304 301    Basic Metabolic Panel: Recent Labs  Lab 08/20/23 1502 08/20/23 2026 08/21/23 0300 08/22/23 1041  NA 143  --  144 144  K 3.4*  --  4.7 3.8  CL 106  --  112* 112*  CO2 25  --  19* 23  GLUCOSE 103*  --  87 106*  BUN 39*  --  32* 21  CREATININE 1.15* 0.91 0.92 0.72  CALCIUM  8.5*  --  8.1* 7.8*  MG  --   --   --  2.0  PHOS  --   --   --  2.4*    GFR: Estimated Creatinine Clearance: 41 mL/min (by C-G formula based on SCr of 0.72 mg/dL).  Liver Function Tests: Recent Labs  Lab 08/20/23 1502 08/21/23  0300  AST 109* 101*  ALT 53* 50*  ALKPHOS 65 61  BILITOT 0.7 0.9  PROT 6.9 6.6  ALBUMIN 2.8* 2.6*    CBG: Recent Labs  Lab 08/22/23 0304  GLUCAP 85     Recent Results (from the past 240 hours)  Resp panel by RT-PCR (RSV, Flu Lakaisha Danish&B, Covid) Anterior Nasal Swab     Status: Abnormal   Collection Time: 08/20/23  1:50 PM   Specimen: Anterior Nasal Swab  Result Value Ref Range Status   SARS Coronavirus 2 by RT PCR NEGATIVE NEGATIVE Final   Influenza Kristyana Notte by PCR POSITIVE (Regla Fitzgibbon) NEGATIVE Final   Influenza B by PCR NEGATIVE NEGATIVE Final    Comment: (NOTE) The Xpert Xpress SARS-CoV-2/FLU/RSV plus assay is intended as an aid in the diagnosis of influenza from Nasopharyngeal swab specimens and should not be used as Raffael Bugarin sole basis for treatment. Nasal washings and aspirates are unacceptable for Xpert Xpress SARS-CoV-2/FLU/RSV testing.  Fact Sheet for Patients: bloggercourse.com  Fact Sheet for Healthcare Providers: seriousbroker.it  This test is not yet approved or cleared by the [place]  FDA and has been authorized for detection and/or diagnosis of SARS-CoV-2 by FDA under an Emergency Use Authorization (EUA). This EUA will remain in effect (meaning this test can be used) for the duration of the COVID-19 declaration under  Section 564(b)(1) of the Act, 21 U.S.C. section 360bbb-3(b)(1), unless the authorization is terminated or revoked.     Resp Syncytial Virus by PCR NEGATIVE NEGATIVE Final    Comment: (NOTE) Fact Sheet for Patients: bloggercourse.com  Fact Sheet for Healthcare Providers: seriousbroker.it  This test is not yet approved or cleared by the [place]  FDA and has been authorized for detection and/or diagnosis of SARS-CoV-2 by FDA under an Emergency Use Authorization (EUA). This EUA will remain in effect (meaning this test can be used) for the duration of the COVID-19 declaration under Section 564(b)(1) of the Act, 21 U.S.C. section 360bbb-3(b)(1), unless the authorization is terminated or revoked.  Performed at [facility], [address] [place], [place] [address]   Culture, blood (Routine X 2) w Reflex to ID Panel     Status: None (Preliminary result)   Collection Time: 08/20/23 11:00 PM   Specimen: BLOOD LEFT ARM  Result Value Ref Range Status   Specimen Description BLOOD LEFT ARM  Final   Special Requests   Final    BOTTLES DRAWN AEROBIC AND ANAEROBIC Blood Culture results may not be optimal due to an inadequate volume of blood received in culture bottles   Culture   Final    NO GROWTH 2 DAYS Performed at [facility], [address] [place], [place] [address]    Report Status PENDING  Incomplete  Culture, blood (Routine X 2) w Reflex to ID Panel     Status: None (Preliminary result)   Collection Time: 08/20/23 11:00 PM   Specimen: BLOOD RIGHT ARM  Result Value Ref Range Status   Specimen Description BLOOD RIGHT ARM  Final   Special Requests   Final    BOTTLES DRAWN AEROBIC AND ANAEROBIC Blood Culture results may not be optimal due to an inadequate volume of blood received in culture bottles   Culture   Final    NO GROWTH 2 DAYS Performed at [facility], [address] [place], [place] [address]     Report Status PENDING  Incomplete         Radiology Studies: ECHOCARDIOGRAM COMPLETE Result  Date: 08/22/2023    ECHOCARDIOGRAM REPORT   Patient Name:   HONESTII MARTON Date of Exam: 08/22/2023 Medical Rec #:  [id number]     Height:       60.0 in Accession #:    [id number]    Weight:       105.0 lb Date of Birth:  August 22, 1945     BSA:          1.419 m Patient Age:    77 years      BP:           144/78 mmHg Patient Gender: F             HR:           76 bpm. Exam Location:  Inpatient Procedure: 2D Echo, Cardiac Doppler and Color Doppler Indications:   NSTEMI I21.4  History:       Patient has no prior history of Echocardiogram examinations.                NSTEMI.  Sonographer:   Lanell Maduro Referring      [id number] [facility] GOEL Phys:  Sonographer Comments: Image acquisition challenging due to uncooperative patient. IMPRESSIONS  1. Left ventricular ejection fraction, by estimation, is 60 to 65%. The left ventricle has normal function. The left ventricle has no regional wall motion abnormalities. Left ventricular diastolic parameters were normal.  2. Right ventricular systolic function is normal. The right ventricular size is normal.  3. The mitral valve is normal in structure. Mild mitral valve regurgitation. No evidence of mitral stenosis.  4. The aortic valve is normal in structure. Aortic valve regurgitation is mild. No aortic stenosis is present.  5. The inferior vena cava is normal in size with greater than 50% respiratory variability, suggesting right atrial pressure of 3 mmHg. FINDINGS  Left Ventricle: Left ventricular ejection fraction, by estimation, is 60 to 65%. The left ventricle has normal function. The left ventricle has no regional wall motion abnormalities. The left ventricular internal cavity size was normal in size. There is  no left ventricular hypertrophy. Left ventricular diastolic parameters were normal. Right Ventricle: The right ventricular size is normal. No increase in right ventricular wall  thickness. Right ventricular systolic function is normal. Left Atrium: Left atrial size was normal in size. Right Atrium: Right atrial size was normal in size. Pericardium: Trivial pericardial effusion is present. Mitral Valve: The mitral valve is normal in structure. Mild mitral valve regurgitation. No evidence of mitral valve stenosis. Tricuspid Valve: The tricuspid valve is normal in structure. Tricuspid valve regurgitation is not demonstrated. No evidence of tricuspid stenosis. Aortic Valve: The aortic valve is normal in structure. Aortic valve regurgitation is mild. No aortic stenosis is present. Pulmonic Valve: The pulmonic valve was normal in structure. Pulmonic valve regurgitation is not visualized. No evidence of pulmonic stenosis. Aorta: The aortic root is normal in size and structure. Venous: The inferior vena cava is normal in size with greater than 50% respiratory variability, suggesting right atrial pressure of 3 mmHg. IAS/Shunts: No atrial level shunt detected by color flow Doppler.  LEFT VENTRICLE PLAX 2D LVIDd:         3.80 cm   Diastology LVIDs:         2.20 cm   LV e' medial:    5.11 cm/s LV PW:         1.00 cm   LV E/e' medial:  18.9 LV IVS:        0.90 cm  LV e' lateral:   7.40 cm/s LVOT diam:     1.60 cm   LV E/e' lateral: 13.1 LV SV:         34 LV SV Index:   24 LVOT Area:     2.01 cm  RIGHT VENTRICLE             IVC RV S prime:     15.10 cm/s  IVC diam: 1.80 cm TAPSE (M-mode): 2.4 cm LEFT ATRIUM           Index       RIGHT ATRIUM          Index LA diam:      2.90 cm 2.04 cm/m  RA Area:     6.70 cm LA Vol (A2C): 7.1 ml  4.99 ml/m  RA Volume:   10.50 ml 7.40 ml/m LA Vol (A4C): 10.0 ml 7.03 ml/m  AORTIC VALVE LVOT Vmax:   93.27 cm/s LVOT Vmean:  62.600 cm/s LVOT VTI:    0.171 m  AORTA Ao Root diam: 2.10 cm MITRAL VALVE                TRICUSPID VALVE MV Area (PHT): 3.21 cm     TR Peak grad:   31.4 mmHg MV Decel Time: 236 msec     TR Vmax:        280.00 cm/s MV E velocity: 96.70 cm/s MV Jaime Grizzell  velocity: 114.00 cm/s  SHUNTS MV E/Knight [place]elkers ratio:  0.85         Systemic VTI:  0.17 m                             Systemic Diam: 1.60 cm Morene Brownie Electronically signed by Morene Brownie Signature Date/Time: 08/22/2023/11:33:59 AM    Final    DG Chest Port 1 View Result Date: 08/22/2023 CLINICAL DATA:  Tachypnea. EXAM: PORTABLE CHEST 1 VIEW COMPARISON:  08/20/2023 FINDINGS: Lungs are hyperexpanded. The lungs are clear without focal pneumonia, edema, pneumothorax or pleural effusion. Interstitial markings are diffusely coarsened with chronic features. The cardiopericardial silhouette is within normal limits for size. Degenerative changes are noted in both shoulders. Telemetry leads overlie the chest. IMPRESSION: Hyperexpansion with chronic interstitial coarsening. No acute cardiopulmonary findings. Left suprahilar nodularity seen on previous chest x-ray not well demonstrated on the current study. Electronically Signed   By: Camellia Candle M.D.   On: 08/22/2023 05:02   DG Tibia/Fibula Left Port Result Date: 08/20/2023 CLINICAL DATA:  Lower leg pain after Neel Buffone fall with broken right hip. EXAM: PORTABLE LEFT TIBIA AND FIBULA - 2 VIEW COMPARISON:  Left femur 08/20/2023 FINDINGS: Examination is technically limited with single lateral view obtained. Lateral view demonstrates Maday Guarino left total knee arthroplasty. As visualized, components appear well seated. The tibia and fibula appear intact. No acute displaced fractures are identified. IMPRESSION: Left total knee arthroplasty. No acute displaced fractures demonstrated on limited lateral view of the left lower leg. Electronically Signed   By: Elsie Gravely M.D.   On: 08/20/2023 21:21   DG Tibia/Fibula Right Result Date: 08/20/2023 CLINICAL DATA:  Lower leg pain after Keyairra Kolinski fall with broken right hip. EXAM: RIGHT TIBIA AND FIBULA - 2 VIEW COMPARISON:  Right femur 08/13/2023 FINDINGS: Limited lateral only view of the right tibia/fibula demonstrates Demonica Farrey right total knee arthroplasty.  As visualized, the components appear well seated. No significant effusion. Visualized tibia and fibula appear intact. No displaced fractures are identified on  single view. IMPRESSION: Right total knee arthroplasty. No displaced fractures demonstrated in the right tibia/fibula on single view. Electronically Signed   By: Elsie Gravely M.D.   On: 08/20/2023 21:20   CT HEAD WO CONTRAST Result Date: 08/20/2023 CLINICAL DATA:  Head trauma, moderate to severe. Poly trauma, blunt. Patient fell 9 days ago. EXAM: CT HEAD WITHOUT CONTRAST CT CERVICAL SPINE WITHOUT CONTRAST TECHNIQUE: Multidetector CT imaging of the head and cervical spine was performed following the standard protocol without intravenous contrast. Multiplanar CT image reconstructions of the cervical spine were also generated. RADIATION DOSE REDUCTION: This exam was performed according to the departmental dose-optimization program which includes automated exposure control, adjustment of the mA and/or kV according to patient size and/or use of iterative reconstruction technique. COMPARISON:  MRI brain, CT head, and CT cervical spine 05/12/2023 FINDINGS: CT HEAD FINDINGS Brain: Diffuse cerebral atrophy. Ventricular dilatation consistent with central atrophy. Low-attenuation changes in the deep white matter consistent with small vessel ischemia. No abnormal extra-axial fluid collections. No mass effect or midline shift. Gray-white matter junctions are distinct. Basal cisterns are not effaced. No acute intracranial hemorrhage. Vascular: No hyperdense vessel or unexpected calcification. Skull: Normal. Negative for fracture or focal lesion. Sinuses/Orbits: No acute finding. Other: None. CT CERVICAL SPINE FINDINGS Alignment: Slight anterior subluxations at C3-4 and C4-5 levels and retrolisthesis at C5-6 level are unchanged since prior study, likely degenerative. Skull base and vertebrae: Skull base appears intact. No vertebral compression deformities. No focal  bone lesion or bone destruction. Soft tissues and spinal canal: No prevertebral soft tissue swelling. No abnormal paraspinal soft tissue mass or infiltration. Ligamentous calcification at the craniocervical junction. Disc levels: Degenerative changes throughout the cervical spine with narrowed disc spaces and endplate osteophyte formation throughout. Degenerative changes in the cervical facet joints. Upper chest: Lung apices are clear. Other: None. IMPRESSION: 1. No acute intracranial abnormalities. Chronic atrophy and small vessel ischemic changes similar to prior studies. 2. Prominent degenerative changes throughout the cervical spine. No acute displaced fractures are identified. Electronically Signed   By: Elsie Gravely M.D.   On: 08/20/2023 18:21   CT CERVICAL SPINE WO CONTRAST Result Date: 08/20/2023 CLINICAL DATA:  Head trauma, moderate to severe. Poly trauma, blunt. Patient fell 9 days ago. EXAM: CT HEAD WITHOUT CONTRAST CT CERVICAL SPINE WITHOUT CONTRAST TECHNIQUE: Multidetector CT imaging of the head and cervical spine was performed following the standard protocol without intravenous contrast. Multiplanar CT image reconstructions of the cervical spine were also generated. RADIATION DOSE REDUCTION: This exam was performed according to the departmental dose-optimization program which includes automated exposure control, adjustment of the mA and/or kV according to patient size and/or use of iterative reconstruction technique. COMPARISON:  MRI brain, CT head, and CT cervical spine 05/12/2023 FINDINGS: CT HEAD FINDINGS Brain: Diffuse cerebral atrophy. Ventricular dilatation consistent with central atrophy. Low-attenuation changes in the deep white matter consistent with small vessel ischemia. No abnormal extra-axial fluid collections. No mass effect or midline shift. Gray-white matter junctions are distinct. Basal cisterns are not effaced. No acute intracranial hemorrhage. Vascular: No hyperdense vessel or  unexpected calcification. Skull: Normal. Negative for fracture or focal lesion. Sinuses/Orbits: No acute finding. Other: None. CT CERVICAL SPINE FINDINGS Alignment: Slight anterior subluxations at C3-4 and C4-5 levels and retrolisthesis at C5-6 level are unchanged since prior study, likely degenerative. Skull base and vertebrae: Skull base appears intact. No vertebral compression deformities. No focal bone lesion or bone destruction. Soft tissues and spinal canal: No prevertebral soft tissue swelling. No abnormal  paraspinal soft tissue mass or infiltration. Ligamentous calcification at the craniocervical junction. Disc levels: Degenerative changes throughout the cervical spine with narrowed disc spaces and endplate osteophyte formation throughout. Degenerative changes in the cervical facet joints. Upper chest: Lung apices are clear. Other: None. IMPRESSION: 1. No acute intracranial abnormalities. Chronic atrophy and small vessel ischemic changes similar to prior studies. 2. Prominent degenerative changes throughout the cervical spine. No acute displaced fractures are identified. Electronically Signed   By: Elsie Gravely M.D.   On: 08/20/2023 18:21   CT CHEST ABDOMEN PELVIS WO CONTRAST Result Date: 08/20/2023 CLINICAL DATA:  Weakness, trauma, cough and congestion, malodorous urine, fall 9 days ago. Advanced dementia. EXAM: CT CHEST, ABDOMEN AND PELVIS WITHOUT CONTRAST TECHNIQUE: Multidetector CT imaging of the chest, abdomen and pelvis was performed following the standard protocol without IV contrast. RADIATION DOSE REDUCTION: This exam was performed according to the departmental dose-optimization program which includes automated exposure control, adjustment of the mA and/or kV according to patient size and/or use of iterative reconstruction technique. COMPARISON:  Multiple exams, including abdominal CT 12/25/2009 and thoracic spine CT 05/12/2023 FINDINGS: CT CHEST FINDINGS Cardiovascular: Atheromatous vascular  calcification of the aortic arch. Mediastinum/Nodes: Unremarkable Lungs/Pleura: Mild biapical pleuroparenchymal scarring. Airway thickening is present, suggesting bronchitis or reactive airways disease. Layering mucus in the right mainstem bronchus. Nodular and branching opacities in the left upper lobe favoring atypical infectious bronchiolitis given the clustered appearance, largest individual nodule 0.8 by 0.6 cm on image 62 series 5. Musculoskeletal: Degenerative glenohumeral arthropathy bilaterally. CT ABDOMEN PELVIS FINDINGS Hepatobiliary: Unremarkable Pancreas: Unremarkable Spleen: Unremarkable Adrenals/Urinary Tract: Bosniak category 1 cyst of the right kidney lower pole laterally on image 69 series 3. Similar lesion in the left kidney upper pole. No further imaging workup of these lesions is indicated. Adrenal glands in urinary bladder unremarkable. Stomach/Bowel: Unremarkable Vascular/Lymphatic: Atherosclerosis is present, including aortoiliac atherosclerotic disease. Reproductive: 2.6 cm calcified uterine fibroid. Otherwise unremarkable. Other: No supplemental non-categorized findings. Musculoskeletal: Subacute fracture of the RIGHT femoral neck is substantially displaced and demonstrates varus angulation. The left hip is substantially flexed and adducted but I do not see Sherrell Farish fracture of the left proximal femur/hip or of the left hemipelvis. Interbody fusion at L1-2 and at L3-L4-L5 with posterolateral rod and pedicle screw fixation at L4-5. No acute lumbar spine findings. IMPRESSION: 1. Subacute fracture of the RIGHT femoral neck is substantially displaced and demonstrates varus angulation. 2. Nodular and branching opacities in the left upper lobe favoring atypical infectious bronchiolitis given the clustered appearance, largest individual nodule 0.8 by 0.6 cm. 3. Airway thickening is present, suggesting bronchitis or reactive airways disease. Layering mucus in the right mainstem bronchus. 4. Degenerative  glenohumeral arthropathy bilaterally. 5. 2.6 cm calcified uterine fibroid. 6.  Aortic Atherosclerosis (ICD10-I70.0). Electronically Signed   By: Ryan Salvage M.D.   On: 08/20/2023 18:00   DG FEMUR PORT MIN 2 VIEWS LEFT Result Date: 08/20/2023 CLINICAL DATA:  Left hip pain EXAM: LEFT FEMUR PORTABLE 1 VIEW COMPARISON:  Pelvic radiograph from [facility] dated 08/13/2023 FINDINGS: There is Johnchristopher Sarvis right femoral neck fracture. Due to difficulties with patient cooperation and discomfort, Mellissa Conley single nonstandard oblique projection is obtained on 2 images. I do not see Aurore Redinger definite left femoral fracture on this single view. There is Karisa Nesser left total knee prosthesis observed. The left hip is held with prominent adduction. IMPRESSION: 1. Right (contralateral) femoral neck fracture. 2. No definite left femoral fracture on this single view. 3. Left total knee prosthesis. 4. Prominent adduction  of the left hip. Electronically Signed   By: Ryan Salvage M.D.   On: 08/20/2023 17:50   DG Chest 1 View Result Date: 08/20/2023 CLINICAL DATA:  Pneumonia, left hip pain EXAM: CHEST  1 VIEW COMPARISON:  05/12/2023 FINDINGS: Subtle left suprahilar nodularity, for further characterization on the patient's CT scan. The lungs appear otherwise clear. Cardiac and mediastinal margins appear normal. No blunting of the costophrenic angles. Degenerative glenohumeral arthropathy is present bilaterally. IMPRESSION: 1. Subtle left suprahilar nodularity, for further characterization on the patient's chest CT scan. 2. Degenerative glenohumeral arthropathy bilaterally. Electronically Signed   By: Ryan Salvage M.D.   On: 08/20/2023 17:46        Scheduled Meds:  [MAR Hold] acetaminophen   650 mg Oral Q4H   [MAR Hold] atorvastatin   40 mg Oral Daily   chlorhexidine   60 mL Topical Once   [MAR Hold] oseltamivir   30 mg Oral BID   povidone-iodine   2 Application Topical Once   [MAR Hold] senna-docusate  2 tablet Oral BID   [MAR Hold]  sodium chloride  flush  3 mL Intravenous Q12H   Continuous Infusions:  amiodarone  30 mg/hr (08/22/23 0934)   [MAR Hold] ampicillin -sulbactam (UNASYN ) IV 3 g (08/22/23 0908)    ceFAZolin  (ANCEF ) IV     lactated ringers  Stopped (08/22/23 0311)   tranexamic acid        LOS: 2 days    Time spent: over 30 min    Meliton Monte, MD [facility]   To contact the attending provider between 7A-7P or the covering provider during after hours 7P-7A, please log into the web site [email or web address] and access using universal [place] password for that web site. If you do not have the password, please call the hospital operator.  08/22/2023, 12:42 PM

## 2023-08-22 NOTE — Plan of Care (Signed)
  Problem: Education: Goal: Knowledge of General Education information will improve Description: Including pain rating scale, medication(s)/side effects and non-pharmacologic comfort measures Outcome: Not Progressing   Problem: Health Behavior/Discharge Planning: Goal: Ability to manage health-related needs will improve Outcome: Not Progressing   Problem: Clinical Measurements: Goal: Ability to maintain clinical measurements within normal limits will improve Outcome: Not Progressing Goal: Will remain free from infection Outcome: Not Progressing Goal: Diagnostic test results will improve Outcome: Not Progressing Goal: Respiratory complications will improve Outcome: Not Progressing Goal: Cardiovascular complication will be avoided Outcome: Not Progressing   Problem: Activity: Goal: Risk for activity intolerance will decrease Outcome: Not Progressing   Problem: Nutrition: Goal: Adequate nutrition will be maintained Outcome: Not Progressing   Problem: Coping: Goal: Level of anxiety will decrease Outcome: Not Progressing   Problem: Elimination: Goal: Will not experience complications related to bowel motility Outcome: Not Progressing Goal: Will not experience complications related to urinary retention Outcome: Not Progressing   Problem: Pain Managment: Goal: General experience of comfort will improve and/or be controlled Outcome: Not Progressing   Problem: Safety: Goal: Ability to remain free from injury will improve Outcome: Not Progressing   Problem: Skin Integrity: Goal: Risk for impaired skin integrity will decrease Outcome: Not Progressing  Pt is being taken care of by husband. Pt was started on  Amiodarone  for high heart rate

## 2023-08-22 NOTE — TOC Initial Note (Signed)
 Transition of Care [facility]) - Initial/Assessment Note    Patient Details  Name: [name] MRN: [id number] Date of Birth: [birth year]  [facility] CM/SW Contact:    Tawni CHRISTELLA Eva, LCSW Phone Number: 08/22/2023, 10:13 AM  Clinical Narrative:                 Pt from home , active with [facility] hospice. TOC to follow for d/c needs .  Expected Discharge Plan:  (TBD) Barriers to Discharge: Continued Medical Work up   Patient Goals and CMS Choice            Expected Discharge Plan and Services       Living arrangements for the past 2 months: Single Family Home                                      Prior Living Arrangements/Services Living arrangements for the past 2 months: Single Family Home Lives with:: Spouse                   Activities of Daily Living   ADL Screening (condition at time of admission) Independently performs ADLs?: No Does the patient have a NEW difficulty with bathing/dressing/toileting/self-feeding that is expected to last >3 days?: Yes (Initiates electronic notice to provider for possible OT consult) (right hip fracture) Does the patient have a NEW difficulty with getting in/out of bed, walking, or climbing stairs that is expected to last >3 days?: Yes (Initiates electronic notice to provider for possible PT consult) (right hip fracture) Does the patient have a NEW difficulty with communication that is expected to last >3 days?: No Is the patient deaf or have difficulty hearing?: Yes Does the patient have difficulty seeing, even when wearing glasses/contacts?: No Does the patient have difficulty concentrating, remembering, or making decisions?: Yes  Permission Sought/Granted                  Emotional Assessment              Admission diagnosis:  Femoral fracture (HCC) [S72.90XA] Influenza A [J10.1] Closed fracture of right hip, initial encounter (HCC) [S72.001A] Fall in elderly patient [R29.6] Patient  Active Problem List   Diagnosis Date Noted   Femoral fracture (HCC) 08/20/2023   Elevated LFTs 08/20/2023   CKD (chronic kidney disease) 08/20/2023   Influenza A 08/20/2023   Acute metabolic encephalopathy 05/12/2023   AKI (acute kidney injury) (HCC) 05/12/2023   Recurrent falls 05/12/2023   History of unsteady gait 05/12/2023   Sensorineural deafness 05/12/2023   Lactic acidosis 05/12/2023   Disrupted sleep-wake cycle 12/27/2021   Unsteady gait 12/27/2021   Alzheimer disease (HCC) 12/06/2021   Generalized anxiety disorder 09/25/2021   Pure hypercholesterolemia 09/25/2021   Late onset Alzheimer's dementia with behavioral disturbance (HCC) 06/19/2020   Dementia with behavioral disturbance (HCC) 05/04/2019   Mixed conductive and sensorineural hearing loss, bilateral 11/17/2017   Dementia (HCC) 07/24/2017   Acute blood loss anemia 01/08/2014   Depression    Osteoarthritis of right knee 01/07/2014   PCP:  Arloa Elsie SAUNDERS, MD Pharmacy:   [id number] [address] [place]), [place] - [address] [place] ([place]) [place] [address] Phone: [phone] Fax: [phone]     Social Drivers of Health (SDOH) Social History: SDOH Screenings   Food Insecurity: Patient Unable To Answer (08/21/2023)  Housing: Patient Unable To Answer (08/21/2023)  Transportation Needs: Patient Unable To Answer (08/21/2023)  Utilities: Patient Unable To Answer (08/21/2023)  Social Connections: Patient Unable To Answer (08/21/2023)  Tobacco Use: Low Risk  (08/20/2023)   SDOH Interventions:     Readmission Risk Interventions     No data to display

## 2023-08-23 DIAGNOSIS — S72001A Fracture of unspecified part of neck of right femur, initial encounter for closed fracture: Secondary | ICD-10-CM | POA: Diagnosis not present

## 2023-08-23 DIAGNOSIS — Z7189 Other specified counseling: Secondary | ICD-10-CM | POA: Diagnosis not present

## 2023-08-23 DIAGNOSIS — Z515 Encounter for palliative care: Secondary | ICD-10-CM | POA: Diagnosis not present

## 2023-08-23 LAB — COMPREHENSIVE METABOLIC PANEL
ALT: 36 U/L (ref 0–44)
AST: 53 U/L — ABNORMAL HIGH (ref 15–41)
Albumin: 1.9 g/dL — ABNORMAL LOW (ref 3.5–5.0)
Alkaline Phosphatase: 44 U/L (ref 38–126)
Anion gap: 6 (ref 5–15)
BUN: 16 mg/dL (ref 8–23)
CO2: 22 mmol/L (ref 22–32)
Calcium: 7.1 mg/dL — ABNORMAL LOW (ref 8.9–10.3)
Chloride: 111 mmol/L (ref 98–111)
Creatinine, Ser: 0.67 mg/dL (ref 0.44–1.00)
GFR, Estimated: >60 mL/min (ref >60)
Glucose, Bld: 105 mg/dL — ABNORMAL HIGH (ref 70–99)
Potassium: 3 mmol/L — ABNORMAL LOW (ref 3.5–5.1)
Sodium: 139 mmol/L (ref 135–145)
Total Bilirubin: 0.8 mg/dL (ref 0.0–1.2)
Total Protein: 4.9 g/dL — ABNORMAL LOW (ref 6.5–8.1)

## 2023-08-23 LAB — CBC WITH DIFFERENTIAL/PLATELET
Abs Immature Granulocytes: 0.1 10*3/uL — ABNORMAL HIGH (ref 0.00–0.07)
Basophils Absolute: 0 10*3/uL (ref 0.0–0.1)
Basophils Relative: 0 %
Eosinophils Absolute: 0.2 10*3/uL (ref 0.0–0.5)
Eosinophils Relative: 2 %
HCT: 32 % — ABNORMAL LOW (ref 36.0–46.0)
Hemoglobin: 10.2 g/dL — ABNORMAL LOW (ref 12.0–15.0)
Immature Granulocytes: 1 %
Lymphocytes Relative: 20 %
Lymphs Abs: 1.9 10*3/uL (ref 0.7–4.0)
MCH: 29.2 pg (ref 26.0–34.0)
MCHC: 31.9 g/dL (ref 30.0–36.0)
MCV: 91.7 fL (ref 80.0–100.0)
Monocytes Absolute: 0.8 10*3/uL (ref 0.1–1.0)
Monocytes Relative: 8 %
Neutro Abs: 6.6 10*3/uL (ref 1.7–7.7)
Neutrophils Relative %: 69 %
Platelets: 263 10*3/uL (ref 150–400)
RBC: 3.49 MIL/uL — ABNORMAL LOW (ref 3.87–5.11)
RDW: 12.9 % (ref 11.5–15.5)
WBC: 9.5 10*3/uL (ref 4.0–10.5)
nRBC: 0 % (ref 0.0–0.2)

## 2023-08-23 LAB — MAGNESIUM: Magnesium: 1.7 mg/dL (ref 1.7–2.4)

## 2023-08-23 MED ORDER — POTASSIUM CHLORIDE CRYS ER 20 MEQ PO TBCR
40.0000 meq | EXTENDED_RELEASE_TABLET | ORAL | Status: AC
Start: 2023-08-23 — End: 2023-08-23
  Administered 2023-08-23 (×2): 40 meq via ORAL
  Filled 2023-08-23 (×2): qty 2

## 2023-08-23 MED ORDER — DEXTROSE IN LACTATED RINGERS 5 % IV SOLN: INTRAVENOUS | Status: AC

## 2023-08-23 NOTE — Progress Notes (Signed)
 Foley catheter was removed this morning. Patient is due to void and has not yet voided. Bladder scan performed 20cc. Will continue to monitor

## 2023-08-23 NOTE — Progress Notes (Signed)
 [name] rm [address] [facility] hospital liaison note:  [name] is a current [facility] hospice patient with a terminal diagnosis of Alzheimer's Disease with early onset. Patient's husband reported that he was feeding patient breakfast and at the end of the meal, patient threw her head back and her eyes rolled back in her head. [facility] nurse arrived for PRN visit, but EMS had already been called and was on sight. Per husband's request, patient was transferred to the ED to be checked out. Family also reports recent change in mental status post fall at home. Patient was admitted on 2.5.25 with concerns for Influenza A and closed fracture of the right hip. Per Dr. Norleen Ballard with [facility] this is a related hospital admission.    Visited at bedside with patient, husband and several family members. Patient is resting after first evaluation by PT. Patient is in no apparent distress and seems to be resting comfortably however she does have Mitts in place to keep from pulling at lines. Discussed care going forward with family and option of revocation of hospice services should rehab be recommended vs home with hospice. Answered all questions and encouraged them to consider options that would work best for patient.    Patient remains inpatient appropriate due to recovery from hip surgery.   Vital Signs: 99.1/65/18    107/64    93% on room air   I/O:  618/300   Abnormal labs: K+ 3, Ca+ 7.1, Phos 2.4, Albumin 1.9, AST 53, RBC 3.49, Hgb 10.2, Hct 32   Diagnostics:  PORTABLE CHEST 1 VIEW IMPRESSION: Hyperexpansion with chronic interstitial coarsening. No acute cardiopulmonary findings.   Left suprahilar nodularity seen on previous chest x-ray not well demonstrated on the current study.    Electronically Signed   By: Camellia Candle M.D.   On: 08/22/2023 05:02   IV/PRN Meds: Amiodarone  16.67ml/H IV continuous, Unasyn  3g q 8H IV, Ancef  2g q 8H IV, D5LR @ 50cc/H IV   Problem list  per progress note [name]., MD 2.8.25: Acute Metabolic Encephalopathy Advanced Dementia At baseline walks without assistive device, but difficult to understand, rarely will say a simple phrase More confused than baseline, suspect delirium in setting of pain, hospitalization, etc Delirium precautions    Right femoral neck fracture with history of recurrent falls At her baseline was walking without assistive device, has been nonambulatory since her fall CT with subacute fx of R femoral neck with substantial displacement and varus angulation S/p R hemiarthroplasty  Post op dvt ppx per ortho, plan for lovenox  x4 weeks WBAT Follow with ortho 2 weeks after surgery Continue pain meds, bowel regimen    SVT Suspect atrial fibrillation with RVR Rates improved after amiodarone  -> will d/c further amio TSH wnl As afib resolved, this is first known incidence, will monitor off anticoagulation for now.  Could consider d/c with cardiac monitor/zio.   Elevated troponins Cardiology suspects myocardial injury in the setting of her recent femur fracture Echo done without regional wall motion abnormalities, preserved EF Heparin  gtt d/c'd per cards increased risk for future morbidity mortality per cards   Elevated CK levels.  downtrending   Influenza A Concern for Possible Superimposed Pneumonia CT chest with nodular and branching opacities in the LUL favoring atypical infectious bronchiolitis, bronchitis or reactive airways disease Tamiflu  Unasyn  for possible pneumonia Blood cultures NGx3 SLP eval recommending thin liquid, age appropriate regular diet   CKD (chronic kidney disease) Creatinine improved today   Elevated LFTs Will  trend, improving Suspect hemodynamically mediated    Discharge Planning:? Discharge back home with hospice services once medically stable   Family contact: talked with family at bedside    IDT:? Updated?     Goals of Care: full code   If patient  requires EMS transport at discharge, please us  [facility] as that is who [facility] is contracted with for transport.   Please call with any hospice related questions or concerns. Hunter Seip, BSN, [employer] hospital liaison [phone]

## 2023-08-23 NOTE — Progress Notes (Signed)
     Subjective:  Patient lying comfortably in bed this morning.  Daughter at bedside.  Daughter pleased with how she has been doing since surgery.  Reports that she has been very comfortable and slept well overnight.  Objective:   VITALS:   Vitals:   08/23/23 0033 08/23/23 0400 08/23/23 0429 08/23/23 0600  BP:  (!) 122/56  (!) 116/54  Pulse:  72  68  Resp:  (!) 24  (!) 21  Temp: 98.6 F (37 C)  98.6 F (37 C)   TempSrc: Oral  Oral   SpO2:  91%  91%  Weight:      Height:        Sensation intact distally Intact pulses distally Incision: dressing C/D/I Compartment soft Patient unable to participate with motor or sensory exam due to dementia  Lab Results  Component Value Date   WBC 9.5 08/23/2023   HGB 10.2 (L) 08/23/2023   HCT 32.0 (L) 08/23/2023   MCV 91.7 08/23/2023   PLT 263 08/23/2023   BMET    Component Value Date/Time   NA 139 08/23/2023 0410   K 3.0 (L) 08/23/2023 0410   CL 111 08/23/2023 0410   CO2 22 08/23/2023 0410   GLUCOSE 105 (H) 08/23/2023 0410   BUN 16 08/23/2023 0410   CREATININE 0.67 08/23/2023 0410   CALCIUM  7.1 (L) 08/23/2023 0410   GFRNONAA >60 08/23/2023 0410      Xray: PACU x-rays show cemented hip hemiarthroplasty in good position no adverse features  Assessment/Plan: 1 Day Post-Op   Principal Problem:   Femoral fracture (HCC) Active Problems:   Dementia with behavioral disturbance (HCC)   Elevated LFTs   CKD (chronic kidney disease)   Influenza A   Closed fracture of right hip (HCC)  Right femoral neck fracture status post hip hemiarthroplasty on 08/22/2023   Post op recs: WB: WBAT   Abx: ancef  x23 hours post op Imaging: PACU xrays Dressing: Aquacel dressing to be kept intact until follow-up DVT prophylaxis: lovenox  starting POD1 x4 weeks Follow up: 2 weeks after surgery for a wound check with Dr. Edna at [facility].  Address: [address], [place], [place] [address]  Office Phone: [phone]      TORIBIO DELENA EDNA 08/23/2023, 9:43 AM   Toribio Edna, MD  Contact information:   [phone] 7am-5pm epic message Dr. Edna, or call office for patient follow up: [phone] After hours and holidays please check [email or web address] for group call information for [facility]

## 2023-08-23 NOTE — Evaluation (Signed)
 Physical Therapy Evaluation Patient Details Name: [name] MRN: [id number] DOB: [birth year] Today's Date: 08/23/2023  History of Present Illness  78 yo female admitted with R femoral fx and flu. s/p R hip hemi 08/22/23. Hx of Alz dementia, falls CKD, hearing loss, unsteady gait  Clinical Impression  On eval, pt required Total A +2 for mobility. Sat pt EOB-Max A for static sitting balance. Pt did not follow commands. She was restless with mittens in place to avoid pulling on lines. Vitals stable during session. Assisted pt back to bed and positioned with pillow between legs to help R LE positioning. Family present during session. Pt lives at home with her husband. She was ambulatory without a device but with assistance; total A for ADLs. Daughter is concerned that pt's husband may not be able to manage pt's care at current level. They are interested in a trial of rehab to see if patient can regain some level of mobility and decrease burden of care on husband (daughter and son live out of state)if that is an option. They did report pt was on home hospice. Recommend TOC consult. Will plan to follow pt during this hospital stay and work with pt as safely able.       If plan is discharge home, recommend the following: Two people to help with walking and/or transfers;Two people to help with bathing/dressing/bathroom;Assistance with feeding   Can travel by private vehicle        Equipment Recommendations Hospital bed;Wheelchair  Recommendations for Other Services       Functional Status Assessment Patient has had a recent decline in their functional status and/or demonstrates limited ability to make significant improvements in function in a reasonable and predictable amount of time     Precautions / Restrictions Precautions Precautions: Fall Restrictions Weight Bearing Restrictions Per Provider Order: No Other Position/Activity Restrictions: WBAT      Mobility  Bed Mobility Overal bed  mobility: Needs Assistance Bed Mobility: Supine to Sit, Sit to Supine     Supine to sit: Total assist, +2 for physical assistance, +2 for safety/equipment Sit to supine: Total assist, +2 for physical assistance, +2 for safety/equipment   General bed mobility comments: Pt did not follow commands. Assist for trunk and bil LEs. Utilized bedpad for repositioning at EOB. Pt unable to sit unsupported at EOB-Max A for static sitting balance.    Transfers                        Ambulation/Gait                  Stairs            Wheelchair Mobility     Tilt Bed    Modified Rankin (Stroke Patients Only)       Balance Overall balance assessment: History of Falls, Needs assistance   Sitting balance-Leahy Scale: Zero                                       Pertinent Vitals/Pain Pain Assessment Pain Assessment: Faces Faces Pain Scale: Hurts even more Pain Descriptors / Indicators: Grimacing Pain Intervention(s): Limited activity within patient's tolerance, Monitored during session, Repositioned    Home Living Family/patient expects to be discharged to:: Unsure Living Arrangements: Spouse/significant other Available Help at Discharge: Family;Available 24 hours/day Type of Home: House Home Access: Stairs to enter Entrance Stairs-Rails:  Right Entrance Stairs-Number of Steps: 1   Home Layout: One level Home Equipment: Shower seat;Grab bars - tub/shower Additional Comments: Attends memory care day center 4 days a week    Prior Function Prior Level of Function : History of Falls (last six months)             Mobility Comments: walking with no device but with assistance of husband ADLs Comments: total assist     Extremity/Trunk Assessment   Upper Extremity Assessment Upper Extremity Assessment: Generalized weakness    Lower Extremity Assessment Lower Extremity Assessment: Generalized weakness (pt tends to keep R LE flexed,  internally rotated. bil knees pressed together-difficult to abduct LEs)    Cervical / Trunk Assessment Cervical / Trunk Assessment: Normal  Communication   Communication Communication: Hearing impairment;Difficulty communicating thoughts/reduced clarity of speech;Difficulty following commands/understanding Cueing Techniques: Verbal cues;Gestural cues;Visual cues;Tactile cues  Cognition Arousal: Alert (only when stimulated. otherwise, sleeping) Behavior During Therapy: Restless Overall Cognitive Status: History of cognitive impairments - at baseline                                          General Comments      Exercises     Assessment/Plan    PT Assessment Patient needs continued PT services  PT Problem List Decreased strength;Decreased balance;Decreased range of motion;Decreased mobility;Decreased activity tolerance;Decreased cognition;Decreased coordination;Decreased knowledge of use of DME;Pain;Decreased safety awareness       PT Treatment Interventions DME instruction;Gait training;Functional mobility training;Therapeutic activities;Therapeutic exercise;Balance training;Patient/family education    PT Goals (Current goals can be found in the Care Plan section)  Acute Rehab PT Goals Patient Stated Goal: family hopeful pt can regain some level of mobility PT Goal Formulation: Patient unable to participate in goal setting Time For Goal Achievement: 09/06/23 Potential to Achieve Goals: Fair    Frequency Min 1X/week     Co-evaluation               AM-PAC PT 6 Clicks Mobility  Outcome Measure Help needed turning from your back to your side while in a flat bed without using bedrails?: Total Help needed moving from lying on your back to sitting on the side of a flat bed without using bedrails?: Total Help needed moving to and from a bed to a chair (including a wheelchair)?: Total Help needed standing up from a chair using your arms (e.g., wheelchair  or bedside chair)?: Total Help needed to walk in hospital room?: Total Help needed climbing 3-5 steps with a railing? : Total 6 Click Score: 6    End of Session   Activity Tolerance: Patient tolerated treatment well (limited by cognition) Patient left: in bed;with call bell/phone within reach;with bed alarm set;with family/visitor present (with mittens)   PT Visit Diagnosis: Other abnormalities of gait and mobility (R26.89);Pain;History of falling (Z91.81);Repeated falls (R29.6)    Time: 1129-1206 PT Time Calculation (min) (ACUTE ONLY): 37 min   Charges:   PT Evaluation $PT Eval Low Complexity: 1 Low PT Treatments $Therapeutic Activity: 8-22 mins PT General Charges $$ ACUTE PT VISIT: 1 Visit            Dannial SQUIBB, PT Acute Rehabilitation  Office: [phone]

## 2023-08-23 NOTE — Progress Notes (Signed)
 PROGRESS NOTE    [name]  FMW:[id number] DOB: June 19, 1946 DOA: 08/20/2023 PCP: Arloa Elsie SAUNDERS, MD  Chief Complaint  Patient presents with   FLU  SX   Weakness   Urinary Frequency    Brief Narrative:   [name] is [name] 78 y.o. female with medical history significant of dementia, CKD presented to the hospital after [name] fall that was [name] week back when she had gone to urgent care center and since then she has not been mobile.  She was also having cough for [name] week and had sick contact with her husband.  At home at some point hospice was engaged but then family wished to have second opinion in the ED.  In the ED, CT scan of the abdomen and pelvis was done which revealed right femoral neck fracture.  Family rescinded DO NOT RESUSCITATE order and Ortho was consulted.   Assessment & Plan:   Principal Problem:   Femoral fracture (HCC) Active Problems:   Dementia with behavioral disturbance (HCC)   Elevated LFTs   CKD (chronic kidney disease)   Influenza [name]   Closed fracture of right hip (HCC)  Goals of care Recommended to son/daughter they reconsider DNR status.  They'll need to discuss this with Mr. Stillman (their father, patient's husband).  She's moderate to high cardiac risk.  At significant risk for post operative delirium.  Discussed with family.   Acute Metabolic Encephalopathy Advanced Dementia At baseline walks without assistive device, but difficult to understand, rarely will say [name] simple phrase More confused than baseline, suspect delirium in setting of pain, hospitalization, etc Delirium precautions   Right femoral neck fracture with history of recurrent falls At her baseline was walking without assistive device, has been nonambulatory since her fall CT with subacute fx of R femoral neck with substantial displacement and varus angulation S/p R hemiarthroplasty  Post op dvt ppx per ortho, plan for lovenox  x4 weeks WBAT Follow with ortho 2 weeks after surgery Continue  pain meds, bowel regimen   SVT Suspect atrial fibrillation with RVR Rates improved after amiodarone  -> will d/c further amio TSH wnl As afib resolved, this is first known incidence, will monitor off anticoagulation for now.  Could consider d/c with cardiac monitor/zio.  Elevated troponins Cardiology suspects myocardial injury in the setting of her recent femur fracture Echo done without regional wall motion abnormalities, preserved EF Heparin  gtt d/c'd per cards increased risk for future morbidity mortality per cards   Elevated CK levels.  downtrending   Influenza [name] Concern for Possible Superimposed Pneumonia CT chest with nodular and branching opacities in the LUL favoring atypical infectious bronchiolitis, bronchitis or reactive airways disease Tamiflu  Unasyn  for possible pneumonia Blood cultures NGx3 SLP eval recommending thin liquid, age appropriate regular diet   CKD (chronic kidney disease) Creatinine improved today   Elevated LFTs Will trend, improving Suspect hemodynamically mediated     DVT prophylaxis: SCD Code Status: full Family Communication: husband, son, daughter Disposition:   Status is: Inpatient Remains inpatient appropriate because: need for inpt care   Consultants:  Orthopedics cardiology  Procedures:  Echo IMPRESSIONS     1. Left ventricular ejection fraction, by estimation, is 60 to 65%. The  left ventricle has normal function. The left ventricle has no regional  wall motion abnormalities. Left ventricular diastolic parameters were  normal.   2. Right ventricular systolic function is normal. The right ventricular  size is normal.   3. The mitral valve is normal in structure. Mild mitral  valve  regurgitation. No evidence of mitral stenosis.   4. The aortic valve is normal in structure. Aortic valve regurgitation is  mild. No aortic stenosis is present.   5. The inferior vena cava is normal in size with greater than 50%  respiratory  variability, suggesting right atrial pressure of 3 mmHg.   Antimicrobials:  Anti-infectives (From admission, onward)    Start     Dose/Rate Route Frequency Ordered Stop   08/22/23 2100  ceFAZolin  (ANCEF ) IVPB 2g/100 mL premix        2 g 200 mL/hr over 30 Minutes Intravenous Every 8 hours 08/22/23 1613 08/23/23 0555   08/22/23 0800  Ampicillin -Sulbactam (UNASYN ) 3 g in sodium chloride  0.9 % 100 mL IVPB        3 g 200 mL/hr over 30 Minutes Intravenous Every 8 hours 08/22/23 0732     08/22/23 0600  ceFAZolin  (ANCEF ) IVPB 2g/100 mL premix        2 g 200 mL/hr over 30 Minutes Intravenous On call to O.R. 08/21/23 1841 08/22/23 1316   08/20/23 2300  oseltamivir  (TAMIFLU ) capsule 30 mg        30 mg Oral 2 times daily 08/20/23 2250 08/25/23 2159   08/20/23 2200  Ampicillin -Sulbactam (UNASYN ) 3 g in sodium chloride  0.9 % 100 mL IVPB  Status:  Discontinued        3 g 200 mL/hr over 30 Minutes Intravenous Every 12 hours 08/20/23 2159 08/22/23 0732       Subjective: Sleepy Discussed with husband, son, daughter  Objective: Vitals:   08/23/23 0429 08/23/23 0600 08/23/23 0800 08/23/23 1300  BP:  (!) 116/54 124/68 107/64  Pulse:  68 66 65  Resp:  (!) 21 (!) 22 18  Temp: 98.6 F (37 C)   99.1 F (37.3 C)  TempSrc: Oral   Oral  SpO2:  91% 93% 93%  Weight:      Height:        Intake/Output Summary (Last 24 hours) at 08/23/2023 1629 Last data filed at 08/23/2023 0600 Gross per 24 hour  Intake 524.79 ml  Output 300 ml  Net 224.79 ml   Filed Weights   08/20/23 1358 08/22/23 1249  Weight: 47.6 kg 47.6 kg    Examination:  General: No acute distress. Cardiovascular: RRR Lungs: unlabored Neurological: lethargic, sleeping Extremities: dressing to RLE  Data Reviewed: I have personally reviewed following labs and imaging studies  CBC: Recent Labs  Lab 08/20/23 1502 08/20/23 2026 08/21/23 0300 08/22/23 1041 08/23/23 0410  WBC 13.0* 9.6 12.0* 10.6* 9.5  NEUTROABS 10.7*  --   --    --  6.6  HGB 14.4 13.7 13.7 11.5* 10.2*  HCT 43.6 42.9 43.4 36.8 32.0*  MCV 90.5 92.7 93.7 94.4 91.7  PLT 335 240 304 301 263    Basic Metabolic Panel: Recent Labs  Lab 08/20/23 1502 08/20/23 2026 08/21/23 0300 08/22/23 1041 08/23/23 0410  NA 143  --  144 144 139  K 3.4*  --  4.7 3.8 3.0*  CL 106  --  112* 112* 111  CO2 25  --  19* 23 22  GLUCOSE 103*  --  87 106* 105*  BUN 39*  --  32* 21 16  CREATININE 1.15* 0.91 0.92 0.72 0.67  CALCIUM  8.5*  --  8.1* 7.8* 7.1*  MG  --   --   --  2.0 1.7  PHOS  --   --   --  2.4*  --  GFR: Estimated Creatinine Clearance: 41 mL/min (by C-G formula based on SCr of 0.67 mg/dL).  Liver Function Tests: Recent Labs  Lab 08/20/23 1502 08/21/23 0300 08/23/23 0410  AST 109* 101* 53*  ALT 53* 50* 36  ALKPHOS 65 61 44  BILITOT 0.7 0.9 0.8  PROT 6.9 6.6 4.9*  ALBUMIN 2.8* 2.6* 1.9*    CBG: Recent Labs  Lab 08/22/23 0304  GLUCAP 85     Recent Results (from the past 240 hours)  Resp panel by RT-PCR (RSV, Flu Sabastion Hrdlicka&B, Covid) Anterior Nasal Swab     Status: Abnormal   Collection Time: 08/20/23  1:50 PM   Specimen: Anterior Nasal Swab  Result Value Ref Range Status   SARS Coronavirus 2 by RT PCR NEGATIVE NEGATIVE Final   Influenza Wilhelmine Krogstad by PCR POSITIVE (Raelee Rossmann) NEGATIVE Final   Influenza B by PCR NEGATIVE NEGATIVE Final    Comment: (NOTE) The Xpert Xpress SARS-CoV-2/FLU/RSV plus assay is intended as an aid in the diagnosis of influenza from Nasopharyngeal swab specimens and should not be used as [name] sole basis for treatment. Nasal washings and aspirates are unacceptable for Xpert Xpress SARS-CoV-2/FLU/RSV testing.  Fact Sheet for Patients: bloggercourse.com  Fact Sheet for Healthcare Providers: seriousbroker.it  This test is not yet approved or cleared by the [place]  FDA and has been authorized for detection and/or diagnosis of SARS-CoV-2 by FDA under an Emergency Use  Authorization (EUA). This EUA will remain in effect (meaning this test can be used) for the duration of the COVID-19 declaration under Section 564(b)(1) of the Act, 21 U.S.C. section 360bbb-3(b)(1), unless the authorization is terminated or revoked.     Resp Syncytial Virus by PCR NEGATIVE NEGATIVE Final    Comment: (NOTE) Fact Sheet for Patients: bloggercourse.com  Fact Sheet for Healthcare Providers: seriousbroker.it  This test is not yet approved or cleared by the [place]  FDA and has been authorized for detection and/or diagnosis of SARS-CoV-2 by FDA under an Emergency Use Authorization (EUA). This EUA will remain in effect (meaning this test can be used) for the duration of the COVID-19 declaration under Section 564(b)(1) of the Act, 21 U.S.C. section 360bbb-3(b)(1), unless the authorization is terminated or revoked.  Performed at [facility], [address], [place], [place] [address]   Culture, blood (Routine X 2) w Reflex to ID Panel     Status: None (Preliminary result)   Collection Time: 08/20/23 11:00 PM   Specimen: BLOOD LEFT ARM  Result Value Ref Range Status   Specimen Description BLOOD LEFT ARM  Final   Special Requests   Final    BOTTLES DRAWN AEROBIC AND ANAEROBIC Blood Culture results may not be optimal due to an inadequate volume of blood received in culture bottles   Culture   Final    NO GROWTH 3 DAYS Performed at [facility], [address] [place], [place] [address]    Report Status PENDING  Incomplete  Culture, blood (Routine X 2) w Reflex to ID Panel     Status: None (Preliminary result)   Collection Time: 08/20/23 11:00 PM   Specimen: BLOOD RIGHT ARM  Result Value Ref Range Status   Specimen Description BLOOD RIGHT ARM  Final   Special Requests   Final    BOTTLES DRAWN AEROBIC AND ANAEROBIC Blood Culture results may not be optimal due to an inadequate volume of blood received in  culture bottles   Culture   Final    NO GROWTH 3 DAYS  Performed at [facility], [address] [place], [place] [address]    Report Status PENDING  Incomplete         Radiology Studies: DG HIP UNILAT W OR W/O PELVIS 2-3 VIEWS RIGHT Result Date: 08/22/2023 CLINICAL DATA:  Postoperative state, right hip. EXAM: DG HIP (WITH OR WITHOUT PELVIS) 2-3V RIGHT COMPARISON:  CT chest, abdomen, and pelvis 08/20/2023 FINDINGS: There is diffuse decreased bone mineralization. Interval total right hip arthroplasty. No perihardware lucency is seen to indicate hardware failure or loosening. Expected postoperative intra-articular and lateral right hip subcutaneous air. Mild superomedial left femoroacetabular joint space narrowing. Mild superior pubic symphysis joint space narrowing. Vascular phleboliths overlie the left hemipelvis. Partial visualization of lower lumbar fusion hardware. IMPRESSION: Interval total right hip arthroplasty without evidence of hardware failure. Electronically Signed   By: [name] M.D.   On: 08/22/2023 17:45   ECHOCARDIOGRAM COMPLETE Result Date: 08/22/2023    ECHOCARDIOGRAM REPORT   Patient Name:   [name] Date of Exam: 08/22/2023 Medical Rec #:  [id number]     Height:       60.0 in Accession #:    [id number]    Weight:       105.0 lb Date of Birth:  [birth year]     BSA:          1.419 m Patient Age:    77 years      BP:           144/78 mmHg Patient Gender: F             HR:           76 bpm. Exam Location:  Inpatient Procedure: 2D Echo, Cardiac Doppler and Color Doppler Indications:   NSTEMI I21.4  History:       Patient has no prior history of Echocardiogram examinations.                NSTEMI.  Sonographer:   [name] Referring      [id number] [facility] GOEL Phys:  Sonographer Comments: Image acquisition challenging due to uncooperative patient. IMPRESSIONS  1. Left ventricular ejection fraction, by estimation, is 60 to 65%. The left ventricle has normal function. The left  ventricle has no regional wall motion abnormalities. Left ventricular diastolic parameters were normal.  2. Right ventricular systolic function is normal. The right ventricular size is normal.  3. The mitral valve is normal in structure. Mild mitral valve regurgitation. No evidence of mitral stenosis.  4. The aortic valve is normal in structure. Aortic valve regurgitation is mild. No aortic stenosis is present.  5. The inferior vena cava is normal in size with greater than 50% respiratory variability, suggesting right atrial pressure of 3 mmHg. FINDINGS  Left Ventricle: Left ventricular ejection fraction, by estimation, is 60 to 65%. The left ventricle has normal function. The left ventricle has no regional wall motion abnormalities. The left ventricular internal cavity size was normal in size. There is  no left ventricular hypertrophy. Left ventricular diastolic parameters were normal. Right Ventricle: The right ventricular size is normal. No increase in right ventricular wall thickness. Right ventricular systolic function is normal. Left Atrium: Left atrial size was normal in size. Right Atrium: Right atrial size was normal in size. Pericardium: Trivial pericardial effusion is present. Mitral Valve: The mitral valve is normal in structure. Mild mitral valve regurgitation. No evidence of mitral valve stenosis. Tricuspid Valve: The tricuspid valve is normal in structure. Tricuspid valve regurgitation is not  demonstrated. No evidence of tricuspid stenosis. Aortic Valve: The aortic valve is normal in structure. Aortic valve regurgitation is mild. No aortic stenosis is present. Pulmonic Valve: The pulmonic valve was normal in structure. Pulmonic valve regurgitation is not visualized. No evidence of pulmonic stenosis. Aorta: The aortic root is normal in size and structure. Venous: The inferior vena cava is normal in size with greater than 50% respiratory variability, suggesting right atrial pressure of 3 mmHg.  IAS/Shunts: No atrial level shunt detected by color flow Doppler.  LEFT VENTRICLE PLAX 2D LVIDd:         3.80 cm   Diastology LVIDs:         2.20 cm   LV e' medial:    5.11 cm/s LV PW:         1.00 cm   LV E/e' medial:  18.9 LV IVS:        0.90 cm   LV e' lateral:   7.40 cm/s LVOT diam:     1.60 cm   LV E/e' lateral: 13.1 LV SV:         34 LV SV Index:   24 LVOT Area:     2.01 cm  RIGHT VENTRICLE             IVC RV S prime:     15.10 cm/s  IVC diam: 1.80 cm TAPSE (M-mode): 2.4 cm LEFT ATRIUM           Index       RIGHT ATRIUM          Index LA diam:      2.90 cm 2.04 cm/m  RA Area:     6.70 cm LA Vol (A2C): 7.1 ml  4.99 ml/m  RA Volume:   10.50 ml 7.40 ml/m LA Vol (A4C): 10.0 ml 7.03 ml/m  AORTIC VALVE LVOT Vmax:   93.27 cm/s LVOT Vmean:  62.600 cm/s LVOT VTI:    0.171 m  AORTA Ao Root diam: 2.10 cm MITRAL VALVE                TRICUSPID VALVE MV Area (PHT): 3.21 cm     TR Peak grad:   31.4 mmHg MV Decel Time: 236 msec     TR Vmax:        280.00 cm/s MV E velocity: 96.70 cm/s MV Roderica Cathell velocity: 114.00 cm/s  SHUNTS MV E/Marlin Brys ratio:  0.85         Systemic VTI:  0.17 m                             Systemic Diam: 1.60 cm Morene Brownie Electronically signed by Morene Brownie Signature Date/Time: 08/22/2023/11:33:59 AM    Final    DG Chest Port 1 View Result Date: 08/22/2023 CLINICAL DATA:  Tachypnea. EXAM: PORTABLE CHEST 1 VIEW COMPARISON:  08/20/2023 FINDINGS: Lungs are hyperexpanded. The lungs are clear without focal pneumonia, edema, pneumothorax or pleural effusion. Interstitial markings are diffusely coarsened with chronic features. The cardiopericardial silhouette is within normal limits for size. Degenerative changes are noted in both shoulders. Telemetry leads overlie the chest. IMPRESSION: Hyperexpansion with chronic interstitial coarsening. No acute cardiopulmonary findings. Left suprahilar nodularity seen on previous chest x-ray not well demonstrated on the current study. Electronically Signed   By: Camellia Candle M.D.   On: 08/22/2023 05:02        Scheduled Meds:  acetaminophen   650 mg Oral Q4H  atorvastatin   40 mg Oral Daily   enoxaparin  (LOVENOX ) injection  30 mg Subcutaneous Q24H   oseltamivir   30 mg Oral BID   senna-docusate  2 tablet Oral BID   sodium chloride  flush  3 mL Intravenous Q12H   Continuous Infusions:  ampicillin -sulbactam (UNASYN ) IV 3 g (08/23/23 0813)   dextrose  5% lactated ringers        LOS: 3 days    Time spent: over 30 min    Meliton Monte, MD [facility]   To contact the attending provider between 7A-7P or the covering provider during after hours 7P-7A, please log into the web site [email or web address] and access using universal [place] password for that web site. If you do not have the password, please call the hospital operator.  08/23/2023, 4:29 PM

## 2023-08-23 NOTE — Progress Notes (Signed)
 Daily Progress Note   Patient Name: [name]       Date: 08/23/2023 DOB: [birth year]  Age: 78 y.o. MRN#: [id number] Attending Physician: [name]., * Primary Care Physician: Arloa Elsie SAUNDERS, MD Admit Date: 08/20/2023  Reason for Consultation/Follow-up: Establishing goals of care  Subjective: Awake, less agitated than on 08-22-23, post op day 1 today, daughter at bedside,ate about 20%breakfast, does not appear to be in acute distress, hospice also following along.   Length of Stay: 3  Current Medications: Scheduled Meds:  . acetaminophen   650 mg Oral Q4H  . atorvastatin   40 mg Oral Daily  . enoxaparin  (LOVENOX ) injection  30 mg Subcutaneous Q24H  . oseltamivir   30 mg Oral BID  . potassium chloride   40 mEq Oral Q4H  . senna-docusate  2 tablet Oral BID  . sodium chloride  flush  3 mL Intravenous Q12H    Continuous Infusions: . amiodarone  30 mg/hr (08/23/23 0520)  . ampicillin -sulbactam (UNASYN ) IV 3 g (08/23/23 0813)  . lactated ringers  10 mL/hr at 08/22/23 1306    PRN Meds: acetaminophen  **OR** acetaminophen , haloperidol  lactate, HYDROmorphone  (DILAUDID ) injection, mouth rinse, oxyCODONE , polyethylene glycol  Physical Exam         Resting in bed Has dementia Thinly built Regular work of breathing No edema  Vital Signs: BP 124/68   Pulse 66   Temp 98.6 F (37 C) (Oral)   Resp (!) 22   Ht 5' (1.524 m)   Wt 47.6 kg   SpO2 93%   BMI 20.51 kg/m  SpO2: SpO2: 93 % O2 Device: O2 Device: Room Air O2 Flow Rate: O2 Flow Rate (L/min): 2 L/min  Intake/output summary:  Intake/Output Summary (Last 24 hours) at 08/23/2023 1127 Last data filed at 08/23/2023 0600 Gross per 24 hour  Intake 1324.79 ml  Output 550 ml  Net 774.79 ml   LBM: Last BM Date :  08/21/23 Baseline Weight: Weight: 47.6 kg Most recent weight: Weight: 47.6 kg       Palliative Assessment/Data:      Patient Active Problem List   Diagnosis Date Noted  . Closed fracture of right hip (HCC) 08/22/2023  . Femoral fracture (HCC) 08/20/2023  . Elevated LFTs 08/20/2023  . CKD (chronic kidney disease) 08/20/2023  . Influenza A 08/20/2023  . Acute metabolic encephalopathy 05/12/2023  .  AKI (acute kidney injury) (HCC) 05/12/2023  . Recurrent falls 05/12/2023  . History of unsteady gait 05/12/2023  . Sensorineural deafness 05/12/2023  . Lactic acidosis 05/12/2023  . Disrupted sleep-wake cycle 12/27/2021  . Unsteady gait 12/27/2021  . Alzheimer disease (HCC) 12/06/2021  . Generalized anxiety disorder 09/25/2021  . Pure hypercholesterolemia 09/25/2021  . Late onset Alzheimer's dementia with behavioral disturbance (HCC) 06/19/2020  . Dementia with behavioral disturbance (HCC) 05/04/2019  . Mixed conductive and sensorineural hearing loss, bilateral 11/17/2017  . Dementia (HCC) 07/24/2017  . Acute blood loss anemia 01/08/2014  . Depression   . Osteoarthritis of right knee 01/07/2014    Palliative Care Assessment & Plan   Patient Profile:    Assessment:  Hip fracture s/p repair Influenza Dementia Recently enrolled with hospice.   Recommendations/Plan: Monitor hospital course, PO intake, PT participation Overall, recommend continuation of hospice services    Code Status:    Code Status Orders  (From admission, onward)           Start     Ordered   08/20/23 2026  Full code  Continuous       Question:  By:  Answer:  Other   08/20/23 2026           Code Status History     Date Active Date Inactive Code Status Order ID Comments User Context   05/12/2023 2144 05/14/2023 1718 Full Code [id number]  Lee Kingfisher, MD ED   01/07/2014 1221 01/10/2014 1339 Full Code [id number]  Ottie Chew, PA-C Inpatient      Advance Directive Documentation     Flowsheet Row Most Recent Value  Type of Advance Directive Healthcare Power of Attorney, Living will  Pre-existing out of facility DNR order (yellow form or pink MOST form) --  MOST Form in Place? --       Prognosis:  Unable to determine  Discharge Planning: To Be Determined  Care plan was discussed with patient, daughter.   Thank you for allowing the Palliative Medicine Team to assist in the care of this patient.  Mod MDM     Greater than 50%  of this time was spent counseling and coordinating care related to the above assessment and plan.  Lonia Serve, MD  Please contact Palliative Medicine Team phone at [phone] for questions and concerns.

## 2023-08-24 DIAGNOSIS — S72001A Fracture of unspecified part of neck of right femur, initial encounter for closed fracture: Secondary | ICD-10-CM | POA: Diagnosis not present

## 2023-08-24 LAB — CBC WITH DIFFERENTIAL/PLATELET
Abs Immature Granulocytes: 0.19 10*3/uL — ABNORMAL HIGH (ref 0.00–0.07)
Basophils Absolute: 0.1 10*3/uL (ref 0.0–0.1)
Basophils Relative: 0 %
Eosinophils Absolute: 0.2 10*3/uL (ref 0.0–0.5)
Eosinophils Relative: 1 %
HCT: 33.6 % — ABNORMAL LOW (ref 36.0–46.0)
Hemoglobin: 10.6 g/dL — ABNORMAL LOW (ref 12.0–15.0)
Immature Granulocytes: 1 %
Lymphocytes Relative: 10 %
Lymphs Abs: 1.6 10*3/uL (ref 0.7–4.0)
MCH: 28.7 pg (ref 26.0–34.0)
MCHC: 31.5 g/dL (ref 30.0–36.0)
MCV: 91.1 fL (ref 80.0–100.0)
Monocytes Absolute: 1 10*3/uL (ref 0.1–1.0)
Monocytes Relative: 6 %
Neutro Abs: 12.9 10*3/uL — ABNORMAL HIGH (ref 1.7–7.7)
Neutrophils Relative %: 82 %
Platelets: 335 10*3/uL (ref 150–400)
RBC: 3.69 MIL/uL — ABNORMAL LOW (ref 3.87–5.11)
RDW: 13.2 % (ref 11.5–15.5)
WBC: 15.9 10*3/uL — ABNORMAL HIGH (ref 4.0–10.5)
nRBC: 0 % (ref 0.0–0.2)

## 2023-08-24 LAB — COMPREHENSIVE METABOLIC PANEL
ALT: 21 U/L (ref 0–44)
AST: 43 U/L — ABNORMAL HIGH (ref 15–41)
Albumin: 2 g/dL — ABNORMAL LOW (ref 3.5–5.0)
Alkaline Phosphatase: 61 U/L (ref 38–126)
Anion gap: 6 (ref 5–15)
BUN: 13 mg/dL (ref 8–23)
CO2: 23 mmol/L (ref 22–32)
Calcium: 7.2 mg/dL — ABNORMAL LOW (ref 8.9–10.3)
Chloride: 114 mmol/L — ABNORMAL HIGH (ref 98–111)
Creatinine, Ser: 0.62 mg/dL (ref 0.44–1.00)
GFR, Estimated: >60 mL/min (ref >60)
Glucose, Bld: 119 mg/dL — ABNORMAL HIGH (ref 70–99)
Potassium: 3.7 mmol/L (ref 3.5–5.1)
Sodium: 143 mmol/L (ref 135–145)
Total Bilirubin: 1 mg/dL (ref 0.0–1.2)
Total Protein: 5.2 g/dL — ABNORMAL LOW (ref 6.5–8.1)

## 2023-08-24 LAB — MAGNESIUM: Magnesium: 1.7 mg/dL (ref 1.7–2.4)

## 2023-08-24 LAB — PHOSPHORUS: Phosphorus: 2.1 mg/dL — ABNORMAL LOW (ref 2.5–4.6)

## 2023-08-24 MED ORDER — ENSURE ENLIVE PO LIQD
237.0000 mL | Freq: Two times a day (BID) | ORAL | Status: DC
  Administered 2023-08-25 – 2023-08-28 (×4): 237 mL via ORAL

## 2023-08-24 MED ORDER — DEXTROSE IN LACTATED RINGERS 5 % IV SOLN
INTRAVENOUS | Status: AC
Start: 2023-08-24 — End: 2023-08-25

## 2023-08-24 NOTE — Plan of Care (Signed)
  Problem: Education: Goal: Knowledge of General Education information will improve Description: Including pain rating scale, medication(s)/side effects and non-pharmacologic comfort measures Outcome: Progressing   Problem: Health Behavior/Discharge Planning: Goal: Ability to manage health-related needs will improve Outcome: Progressing   Problem: Elimination: Goal: Will not experience complications related to bowel motility Outcome: Progressing   Problem: Safety: Goal: Ability to remain free from injury will improve Outcome: Progressing   

## 2023-08-24 NOTE — Progress Notes (Signed)
 [name] rm [address] [facility] hospital liaison note:  [name] is a current [facility] hospice patient with a terminal diagnosis of Alzheimer's Disease with early onset. Patient's husband reported that he was feeding patient breakfast and at the end of the meal, patient threw her head back and her eyes rolled back in her head. [facility] nurse arrived for PRN visit, but EMS had already been called and was on sight. Per husband's request, patient was transferred to the ED to be checked out. Family also reports recent change in mental status post fall at home. Patient was admitted on 2.5.25 with concerns for Influenza A and closed fracture of the right hip. Per Dr. Norleen Ballard with [facility] this is a related hospital admission.    Patient is resting quietly in bed today with Mitts in place. Husband is resting a bedside and reports patient has been doing fairly well aside from the fact that she has been mainly sleeping. They are still giving her IV fluids due to being to sleepy to try to eat. He reports that he believes that her pain is being managed mainly by Tylenol . He continues to report that at this point he is unsure if next steps will be rehab or home as he does not know how she will progress.   Patient remains inpatient appropriate due to recovery from hip surgery, need for IV fluids and treatment for Flu.   Vital Signs: 97.7/79/19    149/68    spO2 90% on room air   I/O:  591/not recorded   Abnormal labs: Chloride 114, Ca+ 7.2, Phos 2.1, Albumin 2, AST 43, WBC 15.9, RBC 3.69, Hgb 10.6, Hct 33.6   Diagnostics: None New  IV/PRN Meds: Unasyn  3g q 8H IV, Ancef  2g q 8H IV, D5LR @ 50cc/H IV   Problem list per progress note [name]., MD 2.8.25: Acute Metabolic Encephalopathy Advanced Dementia At baseline walks without assistive device, but difficult to understand, rarely will say a simple phrase More confused than baseline, suspect delirium in setting of pain,  hospitalization, etc Delirium precautions    Right femoral neck fracture with history of recurrent falls At her baseline was walking without assistive device, has been nonambulatory since her fall CT with subacute fx of R femoral neck with substantial displacement and varus angulation S/p R hemiarthroplasty  Post op dvt ppx per ortho, plan for lovenox  x4 weeks WBAT Follow with ortho 2 weeks after surgery Continue pain meds, bowel regimen    SVT Suspect atrial fibrillation with RVR Rates improved after amiodarone  -> will d/c further amio TSH wnl As afib resolved, this is first known incidence, will monitor off anticoagulation for now.  Could consider d/c with cardiac monitor/zio.   Elevated troponins Cardiology suspects myocardial injury in the setting of her recent femur fracture Echo done without regional wall motion abnormalities, preserved EF Heparin  gtt d/c'd per cards increased risk for future morbidity mortality per cards   Elevated CK levels.  downtrending   Influenza A Concern for Possible Superimposed Pneumonia CT chest with nodular and branching opacities in the LUL favoring atypical infectious bronchiolitis, bronchitis or reactive airways disease Tamiflu  Unasyn  for possible pneumonia Blood cultures NGx3 SLP eval recommending thin liquid, age appropriate regular diet   CKD (chronic kidney disease) Creatinine improved today   Elevated LFTs Will trend, improving Suspect hemodynamically mediated    Discharge Planning:? Ongoing vs skilled rehab   Family contact: talked with husband at bedside    IDT:? Updated?  Goals of Care: full code   If patient requires EMS transport at discharge, please us  [facility] as that is who [facility] is contracted with for transport.   Please call with any hospice related questions or concerns. Hunter Seip, BSN, [employer] hospital liaison [phone]

## 2023-08-24 NOTE — Progress Notes (Signed)
     Subjective:  Patient is nonverbal, her husband is at the bedside and reports that she has been doing pretty well from a pain standpoint.  Objective:   VITALS:   Vitals:   08/24/23 0200 08/24/23 0300 08/24/23 0400 08/24/23 0407  BP: (!) 143/100 (!) 148/71 (!) 140/110 (!) 149/68  Pulse: 72 73 79   Resp: 18 (!) 22 19   Temp:    97.7 F (36.5 C)  TempSrc:    Oral  SpO2: 95% 92% 90%   Weight:      Height:       She has occasional coughing, surgical dressing in place with no drainage.  EHL and FHL are intact from what I can tell although she really does not follow commands very well, and I can barely get her to move her foot and ankle.  Lab Results  Component Value Date   WBC 15.9 (H) 08/24/2023   HGB 10.6 (L) 08/24/2023   HCT 33.6 (L) 08/24/2023   MCV 91.1 08/24/2023   PLT 335 08/24/2023   BMET    Component Value Date/Time   NA 143 08/24/2023 0428   K 3.7 08/24/2023 0428   CL 114 (H) 08/24/2023 0428   CO2 23 08/24/2023 0428   GLUCOSE 119 (H) 08/24/2023 0428   BUN 13 08/24/2023 0428   CREATININE 0.62 08/24/2023 0428   CALCIUM  7.2 (L) 08/24/2023 0428   GFRNONAA >60 08/24/2023 0428     Assessment/Plan: 2 Days Post-Op   Principal Problem:   Femoral fracture (HCC) Active Problems:   Dementia with behavioral disturbance (HCC)   Elevated LFTs   CKD (chronic kidney disease)   Influenza A   Closed fracture of right hip (HCC)   Doing well from our standpoint.  Continue per protocol, okay to weight-bear as tolerated.  Fonda SHAUNNA Olmsted 08/24/2023, 12:48 PM   Fonda Olmsted, MD Cell [phone]

## 2023-08-24 NOTE — Progress Notes (Signed)
 PROGRESS NOTE    [name]  FMW:[id number] DOB: Mar 31, 1946 DOA: 08/20/2023 PCP: Arloa Elsie SAUNDERS, MD  Chief Complaint  Patient presents with   FLU  SX   Weakness   Urinary Frequency    Brief Narrative:   [name] is [name] 78 y.o. female with medical history significant of dementia, CKD presented to the hospital after Abimelec Grochowski fall that was Quinnetta Roepke week back when she had gone to urgent care center and since then she has not been mobile.  She was also having cough for Cristela Stalder week and had sick contact with her husband.  At home at some point hospice was engaged but then family wished to have second opinion in the ED.  In the ED, CT scan of the abdomen and pelvis was done which revealed right femoral neck fracture.  Family rescinded DO NOT RESUSCITATE order and Ortho was consulted.   Assessment & Plan:   Principal Problem:   Femoral fracture (HCC) Active Problems:   Dementia with behavioral disturbance (HCC)   Elevated LFTs   CKD (chronic kidney disease)   Influenza Barlow Harrison   Closed fracture of right hip (HCC)  Goals of care Recommended they reconsider DNR status.  Palliative is following, I appreciate their assistance.   Acute Metabolic Encephalopathy Advanced Dementia At baseline walks without assistive device, but difficult to understand, rarely will say Miakoda Mcmillion simple phrase More confused than baseline, suspect delirium in setting of pain, hospitalization, etc Having some post op delirium -> poor PO intake, hopefully this improves soon Delirium precautions   Right femoral neck fracture with history of recurrent falls At her baseline was walking without assistive device, has been nonambulatory since her fall CT with subacute fx of R femoral neck with substantial displacement and varus angulation S/p R hemiarthroplasty  Post op dvt ppx per ortho, plan for lovenox  x4 weeks WBAT Follow with ortho 2 weeks after surgery Continue pain meds, bowel regimen   SVT Suspect atrial fibrillation with  RVR Rates improved after amiodarone  -> will d/c further amio TSH wnl As afib resolved, this is first known incidence, will monitor off anticoagulation for now.  Could consider d/c with cardiac monitor/zio.  Elevated troponins Cardiology suspects myocardial injury in the setting of her recent femur fracture Echo done without regional wall motion abnormalities, preserved EF Heparin  gtt d/c'd per cards increased risk for future morbidity mortality per cards   Elevated CK levels.  downtrending   Influenza Abraham Margulies Concern for Possible Superimposed Pneumonia CT chest with nodular and branching opacities in the LUL favoring atypical infectious bronchiolitis, bronchitis or reactive airways disease Tamiflu  Unasyn  for possible pneumonia Blood cultures NGx3 SLP eval recommending thin liquid, age appropriate regular diet   CKD (chronic kidney disease) Creatinine improved today   Elevated LFTs Will trend, improving Suspect hemodynamically mediated     DVT prophylaxis: SCD Code Status: full Family Communication: husband, son, daughter Disposition:   Status is: Inpatient Remains inpatient appropriate because: need for inpt care   Consultants:  Orthopedics cardiology  Procedures:  Echo IMPRESSIONS     1. Left ventricular ejection fraction, by estimation, is 60 to 65%. The  left ventricle has normal function. The left ventricle has no regional  wall motion abnormalities. Left ventricular diastolic parameters were  normal.   2. Right ventricular systolic function is normal. The right ventricular  size is normal.   3. The mitral valve is normal in structure. Mild mitral valve  regurgitation. No evidence of mitral stenosis.   4. The aortic  valve is normal in structure. Aortic valve regurgitation is  mild. No aortic stenosis is present.   5. The inferior vena cava is normal in size with greater than 50%  respiratory variability, suggesting right atrial pressure of 3 mmHg.    Antimicrobials:  Anti-infectives (From admission, onward)    Start     Dose/Rate Route Frequency Ordered Stop   08/22/23 2100  ceFAZolin  (ANCEF ) IVPB 2g/100 mL premix        2 g 200 mL/hr over 30 Minutes Intravenous Every 8 hours 08/22/23 1613 08/24/23 0700   08/22/23 0800  Ampicillin -Sulbactam (UNASYN ) 3 g in sodium chloride  0.9 % 100 mL IVPB        3 g 200 mL/hr over 30 Minutes Intravenous Every 8 hours 08/22/23 0732     08/22/23 0600  ceFAZolin  (ANCEF ) IVPB 2g/100 mL premix        2 g 200 mL/hr over 30 Minutes Intravenous On call to O.R. 08/21/23 1841 08/22/23 1316   08/20/23 2300  oseltamivir  (TAMIFLU ) capsule 30 mg        30 mg Oral 2 times daily 08/20/23 2250 08/25/23 2159   08/20/23 2200  Ampicillin -Sulbactam (UNASYN ) 3 g in sodium chloride  0.9 % 100 mL IVPB  Status:  Discontinued        3 g 200 mL/hr over 30 Minutes Intravenous Every 12 hours 08/20/23 2159 08/22/23 0732       Subjective: Sleepy/lethargic Not eating Tashara Suder lot per husband  Objective: Vitals:   08/24/23 0400 08/24/23 0407 08/24/23 0700 08/24/23 1200  BP: (!) 140/110 (!) 149/68 (!) 147/68 135/62  Pulse: 79  74 69  Resp: 19  20 18   Temp:  97.7 F (36.5 C)    TempSrc:  Oral    SpO2: 90%  91% 94%  Weight:      Height:        Intake/Output Summary (Last 24 hours) at 08/24/2023 1812 Last data filed at 08/24/2023 0940 Gross per 24 hour  Intake 650.47 ml  Output --  Net 650.47 ml   Filed Weights   08/20/23 1358 08/22/23 1249  Weight: 47.6 kg 47.6 kg    Examination:  General: No acute distress. Cardiovascular: RRR Lungs: unlabored Abdomen: Soft, nontender, nondistended  Neurological: awake, but lethargic, doesn't say much - in mitts Extremities: No edema.   Data Reviewed: I have personally reviewed following labs and imaging studies  CBC: Recent Labs  Lab 08/20/23 1502 08/20/23 2026 08/21/23 0300 08/22/23 1041 08/23/23 0410 08/24/23 0428  WBC 13.0* 9.6 12.0* 10.6* 9.5 15.9*  NEUTROABS  10.7*  --   --   --  6.6 12.9*  HGB 14.4 13.7 13.7 11.5* 10.2* 10.6*  HCT 43.6 42.9 43.4 36.8 32.0* 33.6*  MCV 90.5 92.7 93.7 94.4 91.7 91.1  PLT 335 240 304 301 263 335    Basic Metabolic Panel: Recent Labs  Lab 08/20/23 1502 08/20/23 2026 08/21/23 0300 08/22/23 1041 08/23/23 0410 08/24/23 0428  NA 143  --  144 144 139 143  K 3.4*  --  4.7 3.8 3.0* 3.7  CL 106  --  112* 112* 111 114*  CO2 25  --  19* 23 22 23   GLUCOSE 103*  --  87 106* 105* 119*  BUN 39*  --  32* 21 16 13   CREATININE 1.15* 0.91 0.92 0.72 0.67 0.62  CALCIUM  8.5*  --  8.1* 7.8* 7.1* 7.2*  MG  --   --   --  2.0 1.7 1.7  PHOS  --   --   --  2.4*  --  2.1*    GFR: Estimated Creatinine Clearance: 41 mL/min (by C-G formula based on SCr of 0.62 mg/dL).  Liver Function Tests: Recent Labs  Lab 08/20/23 1502 08/21/23 0300 08/23/23 0410 08/24/23 0428  AST 109* 101* 53* 43*  ALT 53* 50* 36 21  ALKPHOS 65 61 44 61  BILITOT 0.7 0.9 0.8 1.0  PROT 6.9 6.6 4.9* 5.2*  ALBUMIN 2.8* 2.6* 1.9* 2.0*    CBG: Recent Labs  Lab 08/22/23 0304  GLUCAP 85     Recent Results (from the past 240 hours)  Resp panel by RT-PCR (RSV, Flu Daran Favaro&B, Covid) Anterior Nasal Swab     Status: Abnormal   Collection Time: 08/20/23  1:50 PM   Specimen: Anterior Nasal Swab  Result Value Ref Range Status   SARS Coronavirus 2 by RT PCR NEGATIVE NEGATIVE Final   Influenza Brayden Brodhead by PCR POSITIVE (Alyanah Elliott) NEGATIVE Final   Influenza B by PCR NEGATIVE NEGATIVE Final    Comment: (NOTE) The Xpert Xpress SARS-CoV-2/FLU/RSV plus assay is intended as an aid in the diagnosis of influenza from Nasopharyngeal swab specimens and should not be used as Larina Lieurance sole basis for treatment. Nasal washings and aspirates are unacceptable for Xpert Xpress SARS-CoV-2/FLU/RSV testing.  Fact Sheet for Patients: bloggercourse.com  Fact Sheet for Healthcare Providers: seriousbroker.it  This test is not yet approved or  cleared by the [place]  FDA and has been authorized for detection and/or diagnosis of SARS-CoV-2 by FDA under an Emergency Use Authorization (EUA). This EUA will remain in effect (meaning this test can be used) for the duration of the COVID-19 declaration under Section 564(b)(1) of the Act, 21 U.S.C. section 360bbb-3(b)(1), unless the authorization is terminated or revoked.     Resp Syncytial Virus by PCR NEGATIVE NEGATIVE Final    Comment: (NOTE) Fact Sheet for Patients: bloggercourse.com  Fact Sheet for Healthcare Providers: seriousbroker.it  This test is not yet approved or cleared by the [place]  FDA and has been authorized for detection and/or diagnosis of SARS-CoV-2 by FDA under an Emergency Use Authorization (EUA). This EUA will remain in effect (meaning this test can be used) for the duration of the COVID-19 declaration under Section 564(b)(1) of the Act, 21 U.S.C. section 360bbb-3(b)(1), unless the authorization is terminated or revoked.  Performed at [facility], [address] [place], [place] [address]   Culture, blood (Routine X 2) w Reflex to ID Panel     Status: None (Preliminary result)   Collection Time: 08/20/23 11:00 PM   Specimen: BLOOD LEFT ARM  Result Value Ref Range Status   Specimen Description BLOOD LEFT ARM  Final   Special Requests   Final    BOTTLES DRAWN AEROBIC AND ANAEROBIC Blood Culture results may not be optimal due to an inadequate volume of blood received in culture bottles   Culture   Final    NO GROWTH 4 DAYS Performed at [facility], [address], [place], [place] [address]    Report Status PENDING  Incomplete  Culture, blood (Routine X 2) w Reflex to ID Panel     Status: None (Preliminary result)   Collection Time: 08/20/23 11:00 PM   Specimen: BLOOD RIGHT ARM  Result Value Ref Range Status   Specimen Description BLOOD RIGHT ARM  Final   Special Requests    Final    BOTTLES DRAWN AEROBIC AND ANAEROBIC Blood Culture results may not  be optimal due to an inadequate volume of blood received in culture bottles   Culture   Final    NO GROWTH 4 DAYS Performed at [facility], [address], [place], [place] [address]    Report Status PENDING  Incomplete         Radiology Studies: No results found.       Scheduled Meds:  atorvastatin   40 mg Oral Daily   enoxaparin  (LOVENOX ) injection  30 mg Subcutaneous Q24H   feeding supplement  237 mL Oral BID BM   oseltamivir   30 mg Oral BID   senna-docusate  2 tablet Oral BID   sodium chloride  flush  3 mL Intravenous Q12H   Continuous Infusions:  ampicillin -sulbactam (UNASYN ) IV 3 g (08/24/23 1558)     LOS: 4 days    Time spent: over 30 min    Meliton Monte, MD [facility]   To contact the attending provider between 7A-7P or the covering provider during after hours 7P-7A, please log into the web site [email or web address] and access using universal [place] password for that web site. If you do not have the password, please call the hospital operator.  08/24/2023, 6:12 PM

## 2023-08-25 ENCOUNTER — Encounter (HOSPITAL_COMMUNITY): Payer: Self-pay | Admitting: Orthopedic Surgery

## 2023-08-25 DIAGNOSIS — S72001A Fracture of unspecified part of neck of right femur, initial encounter for closed fracture: Secondary | ICD-10-CM | POA: Diagnosis not present

## 2023-08-25 DIAGNOSIS — Z515 Encounter for palliative care: Secondary | ICD-10-CM | POA: Diagnosis not present

## 2023-08-25 DIAGNOSIS — Z7189 Other specified counseling: Secondary | ICD-10-CM | POA: Diagnosis not present

## 2023-08-25 LAB — PHOSPHORUS: Phosphorus: 1.9 mg/dL — ABNORMAL LOW (ref 2.5–4.6)

## 2023-08-25 LAB — CBC WITH DIFFERENTIAL/PLATELET
Abs Immature Granulocytes: 0.38 10*3/uL — ABNORMAL HIGH (ref 0.00–0.07)
Basophils Absolute: 0 10*3/uL (ref 0.0–0.1)
Basophils Relative: 0 %
Eosinophils Absolute: 0.4 10*3/uL (ref 0.0–0.5)
Eosinophils Relative: 2 %
HCT: 32.9 % — ABNORMAL LOW (ref 36.0–46.0)
Hemoglobin: 10.3 g/dL — ABNORMAL LOW (ref 12.0–15.0)
Immature Granulocytes: 3 %
Lymphocytes Relative: 14 %
Lymphs Abs: 2 10*3/uL (ref 0.7–4.0)
MCH: 28.8 pg (ref 26.0–34.0)
MCHC: 31.3 g/dL (ref 30.0–36.0)
MCV: 91.9 fL (ref 80.0–100.0)
Monocytes Absolute: 1 10*3/uL (ref 0.1–1.0)
Monocytes Relative: 7 %
Neutro Abs: 10.9 10*3/uL — ABNORMAL HIGH (ref 1.7–7.7)
Neutrophils Relative %: 74 %
Platelets: 361 10*3/uL (ref 150–400)
RBC: 3.58 MIL/uL — ABNORMAL LOW (ref 3.87–5.11)
RDW: 13.2 % (ref 11.5–15.5)
WBC: 14.6 10*3/uL — ABNORMAL HIGH (ref 4.0–10.5)
nRBC: 0 % (ref 0.0–0.2)

## 2023-08-25 LAB — COMPREHENSIVE METABOLIC PANEL
ALT: 27 U/L (ref 0–44)
AST: 54 U/L — ABNORMAL HIGH (ref 15–41)
Albumin: 1.8 g/dL — ABNORMAL LOW (ref 3.5–5.0)
Alkaline Phosphatase: 90 U/L (ref 38–126)
Anion gap: 6 (ref 5–15)
BUN: 13 mg/dL (ref 8–23)
CO2: 22 mmol/L (ref 22–32)
Calcium: 7 mg/dL — ABNORMAL LOW (ref 8.9–10.3)
Chloride: 110 mmol/L (ref 98–111)
Creatinine, Ser: 0.62 mg/dL (ref 0.44–1.00)
GFR, Estimated: >60 mL/min (ref >60)
Glucose, Bld: 115 mg/dL — ABNORMAL HIGH (ref 70–99)
Potassium: 3.4 mmol/L — ABNORMAL LOW (ref 3.5–5.1)
Sodium: 138 mmol/L (ref 135–145)
Total Bilirubin: 0.7 mg/dL (ref 0.0–1.2)
Total Protein: 5.3 g/dL — ABNORMAL LOW (ref 6.5–8.1)

## 2023-08-25 LAB — MAGNESIUM: Magnesium: 1.7 mg/dL (ref 1.7–2.4)

## 2023-08-25 MED ORDER — OXYCODONE HCL 5 MG PO TABS: 2.5000 mg | ORAL_TABLET | ORAL | 0 refills | Status: AC | PRN

## 2023-08-25 MED ORDER — ENOXAPARIN SODIUM 40 MG/0.4ML IJ SOSY: 40.0000 mg | PREFILLED_SYRINGE | INTRAMUSCULAR | 0 refills | Status: AC

## 2023-08-25 MED ORDER — POTASSIUM CHLORIDE CRYS ER 20 MEQ PO TBCR
40.0000 meq | EXTENDED_RELEASE_TABLET | ORAL | Status: AC
  Administered 2023-08-25 (×2): 40 meq via ORAL
  Filled 2023-08-25 (×2): qty 2

## 2023-08-25 NOTE — Progress Notes (Addendum)
 [name] rm [address] [facility] hospital liaison note:   [name] is a current [facility] hospice patient with a terminal diagnosis of Alzheimer's Disease with early onset. Patient's husband reported that he was feeding patient breakfast and at the end of the meal, patient threw her head back and her eyes rolled back in her head. [facility] nurse arrived for PRN visit, but EMS had already been called and was on sight. Per husband's request, patient was transferred to the ED to be checked out. Family also reports recent change in mental status post fall at home. Patient was admitted on 2.5.25 with concerns for Influenza A and closed fracture of the right hip. Per Dr. Tessie Fila with [facility] this is a related hospital admission.    Patient is resting quietly in bed today. Husband and daughter at bedside and report that patient had a better dayt yesterday and she was able to eat a full meal. Per family, she stayed awake well into the evening. Patient's daughter is concerned about uncontrolled pain today and will ask for scheduled pain management. Family is leaning towards outpatient rehab at discharge but is awaiting PT consult.    Patient remains inpatient appropriate due to recovery from hip surgery, need for IV fluids and treatment for Flu.   Vital Signs: 98.2/66/21    172/68    spO2 100% on room air   I/O:  291.6/not recorded   Abnormal labs: Ca+ 7.0, Phos 1.9, Albumin 1.8, AST 54, WBC 14.6, RBC 3.58, Hgb 10.3, HCT 32.9, Neut 10.9, Potaf  Potassium 3.4, Glucose 115, Total protein 5.3  Diagnostics: None New   IV/PRN Meds: Unasyn  3g q 8H IV, D5LR @ 50cc/H IV, Oxycodone  5mg  PO x2  Problem list per progress note [name]., MD 2.10.25:  Assessment & Plan:   Principal Problem:   Femoral fracture (HCC) Active Problems:   Dementia with behavioral disturbance (HCC)   Elevated LFTs   CKD (chronic kidney disease)   Influenza A   Closed fracture of right hip  (HCC)   Goals of care Recommended they reconsider DNR status.  Palliative is following, I appreciate their assistance.    Acute Metabolic Encephalopathy Advanced Dementia At baseline walks without assistive device, but difficult to understand, rarely will say a simple phrase More confused than baseline, suspect delirium in setting of pain, hospitalization, etc Having some post op delirium -> PO intake improved today Will see how she does with therapy Delirium precautions    Right femoral neck fracture with history of recurrent falls At her baseline was walking without assistive device, has been nonambulatory since her fall CT with subacute fx of R femoral neck with substantial displacement and varus angulation S/p R hemiarthroplasty  Post op dvt ppx per ortho, plan for lovenox  x4 weeks WBAT Follow with ortho 2 weeks after surgery Continue pain meds, bowel regimen    SVT Suspect atrial fibrillation with RVR Rates improved after amiodarone  -> will d/c further amio TSH wnl As afib resolved, this is first known incidence, will monitor off anticoagulation for now.  Could consider d/c with cardiac monitor/zio.   Elevated troponins Cardiology suspects myocardial injury in the setting of her recent femur fracture Echo done without regional wall motion abnormalities, preserved EF Heparin  gtt d/c'd per cards increased risk for future morbidity mortality per cards   Elevated CK levels.  downtrending   Influenza A Concern for Possible Superimposed Pneumonia CT chest with nodular and branching opacities in the LUL favoring atypical  infectious bronchiolitis, bronchitis or reactive airways disease Tamiflu  Unasyn  for possible pneumonia (7 day course) Blood cultures NGx3 SLP eval recommending thin liquid, age appropriate regular diet   CKD (chronic kidney disease) Creatinine improved today   Elevated LFTs Will trend, mild Suspect hemodynamically mediated     Discharge Planning:?  Ongoing vs skilled rehab   Family contact: talked with husband at bedside    IDT:? Updated?     Goals of Care: full code   If patient requires EMS transport at discharge, please us  [facility] as that is who [facility] is contracted with for transport.   Please call with any hospice related questions or concerns. Ardine Beckwith, LPN [facility] hospital liaison [phone]

## 2023-08-25 NOTE — Progress Notes (Signed)
 PROGRESS NOTE    [name]  ZOX:[id number] DOB: 1945-10-15 DOA: 08/20/2023 PCP: Roselind Congo, MD  Chief Complaint  Patient presents with   FLU  SX   Weakness   Urinary Frequency    Brief Narrative:   [name] is Shantee Hayne 78 y.o. female with medical history significant of dementia, CKD presented to the hospital after [name] fall that was [name] week back when she had gone to urgent care center and since then she has not been mobile.  She was also having cough for [name] week and had sick contact with her husband.  At home at some point hospice was engaged but then family wished to have second opinion in the ED.  In the ED, CT scan of the abdomen and pelvis was done which revealed right femoral neck fracture.  Family rescinded DO NOT RESUSCITATE order and Ortho was consulted.   Assessment & Plan:   Principal Problem:   Femoral fracture (HCC) Active Problems:   Dementia with behavioral disturbance (HCC)   Elevated LFTs   CKD (chronic kidney disease)   Influenza [name]   Closed fracture of right hip (HCC)  Goals of care Recommended they reconsider DNR status.  Palliative is following, I appreciate their assistance.   Acute Metabolic Encephalopathy Advanced Dementia At baseline walks without assistive device, but difficult to understand, rarely will say [name] simple phrase More confused than baseline, suspect delirium in setting of pain, hospitalization, etc Having some post op delirium -> PO intake improved today Will see how she does with therapy Delirium precautions   Right femoral neck fracture with history of recurrent falls At her baseline was walking without assistive device, has been nonambulatory since her fall CT with subacute fx of R femoral neck with substantial displacement and varus angulation Diana/p R hemiarthroplasty  Post op dvt ppx per ortho, plan for lovenox  x4 weeks WBAT Follow with ortho 2 weeks after surgery Continue pain meds, bowel regimen   SVT Suspect atrial  fibrillation with RVR Rates improved after amiodarone  -> will d/c further amio TSH wnl As afib resolved, this is first known incidence, will monitor off anticoagulation for now.  Could consider d/c with cardiac monitor/zio.  Elevated troponins Cardiology suspects myocardial injury in the setting of her recent femur fracture Echo done without regional wall motion abnormalities, preserved EF Heparin  gtt d/c'd per cards increased risk for future morbidity mortality per cards   Elevated CK levels.  downtrending   Influenza [name] Concern for Possible Superimposed Pneumonia CT chest with nodular and branching opacities in the LUL favoring atypical infectious bronchiolitis, bronchitis or reactive airways disease Tamiflu  Unasyn  for possible pneumonia (7 day course) Blood cultures NGx3 SLP eval recommending thin liquid, age appropriate regular diet   CKD (chronic kidney disease) Creatinine improved today   Elevated LFTs Will trend, mild Suspect hemodynamically mediated     DVT prophylaxis: SCD Code Status: full Family Communication: husband, daughter Disposition:   Status is: Inpatient Remains inpatient appropriate because: need for inpt care   Consultants:  Orthopedics cardiology  Procedures:  Echo IMPRESSIONS     1. Left ventricular ejection fraction, by estimation, is 60 to 65%. The  left ventricle has normal function. The left ventricle has no regional  wall motion abnormalities. Left ventricular diastolic parameters were  normal.   2. Right ventricular systolic function is normal. The right ventricular  size is normal.   3. The mitral valve is normal in structure. Mild mitral valve  regurgitation. No evidence of mitral  stenosis.   4. The aortic valve is normal in structure. Aortic valve regurgitation is  mild. No aortic stenosis is present.   5. The inferior vena cava is normal in size with greater than 50%  respiratory variability, suggesting right atrial pressure  of 3 mmHg.   Antimicrobials:  Anti-infectives (From admission, onward)    Start     Dose/Rate Route Frequency Ordered Stop   08/22/23 2100  ceFAZolin  (ANCEF ) IVPB 2g/100 mL premix        2 g 200 mL/hr over 30 Minutes Intravenous Every 8 hours 08/22/23 1613 08/24/23 0700   08/22/23 0800  Ampicillin -Sulbactam (UNASYN ) 3 g in sodium chloride  0.9 % 100 mL IVPB        3 g 200 mL/hr over 30 Minutes Intravenous Every 8 hours 08/22/23 0732     08/22/23 0600  ceFAZolin  (ANCEF ) IVPB 2g/100 mL premix        2 g 200 mL/hr over 30 Minutes Intravenous On call to O.R. 08/21/23 1841 08/22/23 1316   08/20/23 2300  oseltamivir  (TAMIFLU ) capsule 30 mg        30 mg Oral 2 times daily 08/20/23 2250 08/25/23 2159   08/20/23 2200  Ampicillin -Sulbactam (UNASYN ) 3 g in sodium chloride  0.9 % 100 mL IVPB  Status:  Discontinued        3 g 200 mL/hr over 30 Minutes Intravenous Every 12 hours 08/20/23 2159 08/22/23 0732       Subjective: Eating last night and this AM Daughter, husband at bedside - hoping for rehab   Objective: Vitals:   08/25/23 0200 08/25/23 0300 08/25/23 0400 08/25/23 0540  BP: (!) 173/71 (!) 180/83 (!) 172/68   Pulse: 67 65 66   Resp: (!) 21 20 (!) 21   Temp:    98.2 F (36.8 C)  TempSrc:    Axillary  SpO2: 99% 100% 100%   Weight:      Height:        Intake/Output Summary (Last 24 hours) at 08/25/2023 1440 Last data filed at 08/25/2023 0839 Gross per 24 hour  Intake 288.55 ml  Output --  Net 288.55 ml   Filed Weights   08/20/23 1358 08/22/23 1249  Weight: 47.6 kg 47.6 kg    Examination:  General: No acute distress. Cardiovascular: RRR Lungs: unlabored Abdomen: Soft, nontender, nondistended  Neurological: awake, doesn't speak, moving extremities spontaneously . Extremities: No clubbing or cyanosis. No edema.   Data Reviewed: I have personally reviewed following labs and imaging studies  CBC: Recent Labs  Lab 08/20/23 1502 08/20/23 2026 08/21/23 0300  08/22/23 1041 08/23/23 0410 08/24/23 0428 08/25/23 0325  WBC 13.0*   < > 12.0* 10.6* 9.5 15.9* 14.6*  NEUTROABS 10.7*  --   --   --  6.6 12.9* 10.9*  HGB 14.4   < > 13.7 11.5* 10.2* 10.6* 10.3*  HCT 43.6   < > 43.4 36.8 32.0* 33.6* 32.9*  MCV 90.5   < > 93.7 94.4 91.7 91.1 91.9  PLT 335   < > 304 301 263 335 361   < > = values in this interval not displayed.    Basic Metabolic Panel: Recent Labs  Lab 08/21/23 0300 08/22/23 1041 08/23/23 0410 08/24/23 0428 08/25/23 0325  NA 144 144 139 143 138  K 4.7 3.8 3.0* 3.7 3.4*  CL 112* 112* 111 114* 110  CO2 19* 23 22 23 22   GLUCOSE 87 106* 105* 119* 115*  BUN 32* 21 16 13  13  CREATININE 0.92 0.72 0.67 0.62 0.62  CALCIUM  8.1* 7.8* 7.1* 7.2* 7.0*  MG  --  2.0 1.7 1.7 1.7  PHOS  --  2.4*  --  2.1* 1.9*    GFR: Estimated Creatinine Clearance: 41 mL/min (by C-G formula based on SCr of 0.62 mg/dL).  Liver Function Tests: Recent Labs  Lab 08/20/23 1502 08/21/23 0300 08/23/23 0410 08/24/23 0428 08/25/23 0325  AST 109* 101* 53* 43* 54*  ALT 53* 50* 36 21 27  ALKPHOS 65 61 44 61 90  BILITOT 0.7 0.9 0.8 1.0 0.7  PROT 6.9 6.6 4.9* 5.2* 5.3*  ALBUMIN 2.8* 2.6* 1.9* 2.0* 1.8*    CBG: Recent Labs  Lab 08/22/23 0304  GLUCAP 85     Recent Results (from the past 240 hours)  Resp panel by RT-PCR (RSV, Flu Taequan Stockhausen&B, Covid) Anterior Nasal Swab     Status: Abnormal   Collection Time: 08/20/23  1:50 PM   Specimen: Anterior Nasal Swab  Result Value Ref Range Status   SARS Coronavirus 2 by RT PCR NEGATIVE NEGATIVE Final   Influenza Lawonda Pretlow by PCR POSITIVE (Alvin Diffee) NEGATIVE Final   Influenza B by PCR NEGATIVE NEGATIVE Final    Comment: (NOTE) The Xpert Xpress SARS-CoV-2/FLU/RSV plus assay is intended as an aid in the diagnosis of influenza from Nasopharyngeal swab specimens and should not be used as Abhiram Criado sole basis for treatment. Nasal washings and aspirates are unacceptable for Xpert Xpress SARS-CoV-2/FLU/RSV testing.  Fact Sheet for  Patients: BloggerCourse.com  Fact Sheet for Healthcare Providers: SeriousBroker.it  This test is not yet approved or cleared by the [place]  FDA and has been authorized for detection and/or diagnosis of SARS-CoV-2 by FDA under an Emergency Use Authorization (EUA). This EUA will remain in effect (meaning this test can be used) for the duration of the COVID-19 declaration under Section 564(b)(1) of the Act, [place] section 360bbb-3(b)(1), unless the authorization is terminated or revoked.     Resp Syncytial Virus by PCR NEGATIVE NEGATIVE Final    Comment: (NOTE) Fact Sheet for Patients: BloggerCourse.com  Fact Sheet for Healthcare Providers: SeriousBroker.it  This test is not yet approved or cleared by the [place]  FDA and has been authorized for detection and/or diagnosis of SARS-CoV-2 by FDA under an Emergency Use Authorization (EUA). This EUA will remain in effect (meaning this test can be used) for the duration of the COVID-19 declaration under Section 564(b)(1) of the Act, 21 [place] section 360bbb-3(b)(1), unless the authorization is terminated or revoked.  Performed at [facility], [address], [place], [place] [address]   Culture, blood (Routine X 2) w Reflex to ID Panel     Status: None (Preliminary result)   Collection Time: 08/20/23 11:00 PM   Specimen: BLOOD LEFT ARM  Result Value Ref Range Status   Specimen Description BLOOD LEFT ARM  Final   Special Requests   Final    BOTTLES DRAWN AEROBIC AND ANAEROBIC Blood Culture results may not be optimal due to an inadequate volume of blood received in culture bottles   Culture   Final    NO GROWTH 4 DAYS Performed at [facility], [address] [place], [place] [address]    Report Status PENDING  Incomplete  Culture, blood (Routine X 2) w Reflex to ID Panel     Status: None (Preliminary  result)   Collection Time: 08/20/23 11:00 PM   Specimen: BLOOD RIGHT ARM  Result Value Ref Range Status   Specimen  Description BLOOD RIGHT ARM  Final   Special Requests   Final    BOTTLES DRAWN AEROBIC AND ANAEROBIC Blood Culture results may not be optimal due to an inadequate volume of blood received in culture bottles   Culture   Final    NO GROWTH 4 DAYS Performed at [facility], [address] [place], [place] [address]    Report Status PENDING  Incomplete         Radiology Studies: No results found.       Scheduled Meds:  atorvastatin   40 mg Oral Daily   enoxaparin  (LOVENOX ) injection  30 mg Subcutaneous Q24H   feeding supplement  237 mL Oral BID BM   oseltamivir   30 mg Oral BID   potassium chloride   40 mEq Oral Q4H   senna-docusate  2 tablet Oral BID   sodium chloride  flush  3 mL Intravenous Q12H   Continuous Infusions:  ampicillin -sulbactam (UNASYN ) IV 3 g (08/25/23 4696)   dextrose  5% lactated ringers  50 mL/hr at 08/24/23 2337     LOS: 5 days    Time spent: over 30 min    Donnetta Gains, MD [facility]   To contact the attending provider between 7A-7P or the covering provider during after hours 7P-7A, please log into the web site [email or web address] and access using universal [place] password for that web site. If you do not have the password, please call the hospital operator.  08/25/2023, 2:40 PM

## 2023-08-25 NOTE — TOC Progression Note (Addendum)
 Transition of Care [facility]) - Progression Note    Patient Details  Name: [name] MRN: [id number] Date of Birth: 24-Jul-1945  Transition of Care [facility]) CM/SW Contact  Gertha Ku, LCSW Phone Number: 08/25/2023, 11:47 AM  Clinical Narrative:     Noted, the pt is recommended for SNF placement. The pt is active with [facility] hospice and will have to rescind hospice if SNF is preferred. GOC is being discussed, TOC will follow.  Adden  12:00pm CSW spoke with the pt's husband to discuss the recommendation for SNF placement. CSW explained that the pt would need to rescind hospice services to go to rehab. Pt's husband would like the pt to receive rehab after this hospitalization and will rescind hospice. CSW received a message from [facility] indicating that the pt will be under Traditional Medicare until the end of the month. CSW will fax pt out for SNF. TOC to follow.    Expected Discharge Plan:  (TBD) Barriers to Discharge: Continued Medical Work up  Expected Discharge Plan and Services       Living arrangements for the past 2 months: Single Family Home                                       Social Determinants of Health (SDOH) Interventions SDOH Screenings   Food Insecurity: Patient Unable To Answer (08/21/2023)  Housing: Patient Unable To Answer (08/21/2023)  Transportation Needs: Patient Unable To Answer (08/21/2023)  Utilities: Patient Unable To Answer (08/21/2023)  Social Connections: Patient Unable To Answer (08/21/2023)  Tobacco Use: Low Risk  (08/22/2023)    Readmission Risk Interventions     No data to display

## 2023-08-25 NOTE — Progress Notes (Addendum)
 Physical Therapy Treatment Patient Details Name: [name] MRN: [id number] DOB: [birth year] Today's Date: 08/25/2023   History of Present Illness 78 yo female admitted with R femoral fx and flu. s/p R hip hemi 08/22/23. Hx of Alz dementia, falls CKD, hearing loss, unsteady gait    PT Comments  Family present and agreeable to PT session. Pt does not follow commands. Continues to require Total A +2 for mobility. She was a bit agitated during session today-resistant at times. Remains at risk for further falls. Unsure of rehab potential-could attempt a trial of inpatient rehab after this hospital stay. Family would like pt to be given a chance to rehabilitate if at all possible.    If plan is discharge home, recommend the following: Two people to help with walking and/or transfers;Two people to help with bathing/dressing/bathroom;Assistance with feeding   Can travel by private vehicle        Equipment Recommendations  Hospital bed;Wheelchair (measurements PT)    Recommendations for Other Services       Precautions / Restrictions Precautions Precautions: Fall Precaution Comments: mittens Restrictions Weight Bearing Restrictions Per Provider Order: No Other Position/Activity Restrictions: WBAT     Mobility  Bed Mobility Overal bed mobility: Needs Assistance Bed Mobility: Rolling, Supine to Sit, Sit to Supine Rolling: Mod assist, +2 for physical assistance, +2 for safety/equipment   Supine to sit: Total assist, +2 for physical assistance, +2 for safety/equipment Sit to supine: Total assist, +2 for physical assistance, +2 for safety/equipment   General bed mobility comments: Pt does not follow commands. Assist for trunk and bil LEs. Utilized bedpad for repositioning at EOB. Pt unable to sit unsupported at EOB-Max A required for balance (+2 at times 2* pushing into extension/resistant). Heavy posterior lean.    Transfers                   General transfer comment: NT-not  safely able    Ambulation/Gait                   Stairs             Wheelchair Mobility     Tilt Bed    Modified Rankin (Stroke Patients Only)       Balance Overall balance assessment: History of Falls, Needs assistance   Sitting balance-Leahy Scale: Zero                                      Cognition Arousal: Alert (only when stimulate, otherwise sleeping) Behavior During Therapy: WFL for tasks assessed/performed Overall Cognitive Status: History of cognitive impairments - at baseline                                 General Comments: more vocal today, a bit agitated with mobility. does not follow commands        Exercises Unable to range R LE 2* pt resistance. Attempted to properly position with pillows.      General Comments        Pertinent Vitals/Pain Pain Assessment Pain Assessment: Faces Faces Pain Scale: Hurts even more Pain Descriptors / Indicators: Grimacing Pain Intervention(s): Limited activity within patient's tolerance, Monitored during session, Repositioned    Home Living  Prior Function            PT Goals (current goals can now be found in the care plan section) Progress towards PT goals: Not progressing toward goals - comment    Frequency    Min 1X/week      PT Plan      Co-evaluation              AM-PAC PT "6 Clicks" Mobility   Outcome Measure  Help needed turning from your back to your side while in a flat bed without using bedrails?: Total Help needed moving from lying on your back to sitting on the side of a flat bed without using bedrails?: Total Help needed moving to and from a bed to a chair (including a wheelchair)?: Total Help needed standing up from a chair using your arms (e.g., wheelchair or bedside chair)?: Total Help needed to walk in hospital room?: Total Help needed climbing 3-5 steps with a railing? : Total 6 Click Score:  6    End of Session   Activity Tolerance: Other (comment) (limited by cognition) Patient left: in bed;with call bell/phone within reach;with bed alarm set;with family/visitor present (with mittens)   PT Visit Diagnosis: Other abnormalities of gait and mobility (R26.89);Pain;History of falling (Z91.81);Repeated falls (R29.6)     Time: 7829-5621 PT Time Calculation (min) (ACUTE ONLY): 31 min  Charges:    $Therapeutic Activity: 23-37 mins PT General Charges $$ ACUTE PT VISIT: 1 Visit                        Tanda Falter, PT Acute Rehabilitation  Office: [phone]

## 2023-08-25 NOTE — Evaluation (Signed)
 Clinical/Bedside Swallow Evaluation Patient Details  Name: [name] MRN: [id number] Date of Birth: [birth year]  Today's Date: 08/25/2023 Time: SLP Start Time (ACUTE ONLY): 0950 SLP Stop Time (ACUTE ONLY): 1003 SLP Time Calculation (min) (ACUTE ONLY): 13 min  Past Medical History:  Past Medical History:  Diagnosis Date   Anxiety    Arthritis    lower back & knees   Back pain    Cataracts, bilateral    Depression    takes Citalopram  daily   Encephalitis 1973   Headache(784.0)    occasionally from a fall a month ago   Hearing loss    History of blood transfusion    no abnormal reaction noted   Hypertension    Insomnia    Joint pain    Joint swelling    Memory loss    Nocturia    Osteoarthritis    Rosacea    Past Surgical History:  Past Surgical History:  Procedure Laterality Date   BACK SURGERY  2007   lumbar fusion w/ rods & screws- lower back   COLONOSCOPY     CYST EXCISION  2005   lumbar cyst   HIP ARTHROPLASTY Right 08/22/2023   Procedure: ARTHROPLASTY BIPOLAR HIP (HEMIARTHROPLASTY);  Surgeon: Murleen Arms, MD;  Location: [facility];  Service: Orthopedics;  Laterality: Right;   JOINT REPLACEMENT  2009   left knee replacement   MASS EXCISION  twice: 08/28/2011; 2012   Procedure: EXCISION MASS;  Surgeon: Kemp Patter, MD;  Location: [facility];  Service: Orthopedics;  Laterality: Left;  excision mass left ring finger   MASS EXCISION  02/11/2012   Procedure: EXCISION MASS;  Surgeon: Kemp Patter, MD;  Location: [facility];  Service: Orthopedics;  Laterality: Left;  EXCISION MUCOID CYST, DEBRIDEMENT INTERPHALANGEAL JOINT LEFT THUMB   mass removed from left ring finger  2013   RETINAL DETACHMENT SURGERY Right    SHOULDER ARTHROSCOPY WITH ROTATOR CUFF REPAIR AND SUBACROMIAL DECOMPRESSION Left 07/26/2013   Procedure: LEFT SHOULDER ARTHROSCOPY WITH /SUBACROMIAL DECOMPRESSION AND DISTAL CLAVICLE RESECTION;  Surgeon: Forbes Ida., MD;   Location: [facility];  Service: Orthopedics;  Laterality: Left;   TOTAL KNEE ARTHROPLASTY Right 01/07/2014   Procedure: RIGHT TOTAL KNEE ARTHROPLASTY MEDIAL/LATERAL COMPARTMENTS WITH PATELLA RESURFACING;  Surgeon: Forbes Ida., MD;  Location: [facility];  Service: Orthopedics;  Laterality: Right;   HPI:  [name] is a 78 y.o. female with a hx of end-stage Alzheimer's dementia, recurrent falls, and CKD. Pt was being followed by home hospice. Began to have rapid deterioration at home, family elected to bring pt in to ED on 08/20/23.  SLP completed BSE on 08/21/23 and reported her swallow function appeared [facility] at that time. She underwent a bilateral hip arthroplasty on 08/22/23 and has not been very awake or alert since that time. MD reordered SLP swallow evaluation to determine if she is indeed safe for PO intake. Dx include myocardial injury, possible myocarditis, influenza A, subacute hip fx causing extreme pain.  PTA, pt eating/drinking without difficulty. Her husband has been caring for her the last eight years.    Assessment / Plan / Recommendation  Clinical Impression  Patient is presenting with a cognitive based dysphagia (h/o dementia) however from speaking with patient's daughter who was in the room, this is likely baseline. Patient did exhibit instances of coughing, however this was in absence of PO's. SLP assessed her swallow as her daughter fed her some pureed  solids and thin liquids. Patient required cues from daughter to cease blowing through the straw (per daughter this is a behavior seen at home as well) . Patient was eventually able to drink liquids through straw. Aside from suspected oral transit delays with solids, patient exhibited timely swallow initiation and no overt s/s aspiration. Patient started to close eyes and seemed to be falling back asleep at end of session but oral cavity inspected and was free of PO's. SLP provided daughter with some education regarding importance  of ensuring oral cavity is clear of PO's after intake is complete. SLP  not recommending f/u intervention at this time but is recommending continued careful feeding of patient by staff and/or family members. SLP Visit Diagnosis: Dysphagia, unspecified (R13.10)    Aspiration Risk  Mild aspiration risk    Diet Recommendation Regular;Thin liquid    Liquid Administration via: Cup;Straw Medication Administration: Whole meds with liquid Supervision: Full supervision/cueing for compensatory strategies Compensations: Slow rate;Small sips/bites Postural Changes: Seated upright at 90 degrees    Other  Recommendations Oral Care Recommendations: Oral care BID    Recommendations for follow up therapy are one component of a multi-disciplinary discharge planning process, led by the attending physician.  Recommendations may be updated based on patient status, additional functional criteria and insurance authorization.  Follow up Recommendations No SLP follow up      Assistance Recommended at Discharge    Functional Status Assessment Patient has not had a recent decline in their functional status  Frequency and Duration   N/A         Prognosis   N/A     Swallow Study   General Date of Onset: 08/20/23 HPI: [name] is a 78 y.o. female with a hx of end-stage Alzheimer's dementia, recurrent falls, and CKD. Pt was being followed by home hospice. Began to have rapid deterioration at home, family elected to bring pt in to ED on 08/20/23.  SLP completed BSE on 08/21/23 and reported her swallow function appeared [facility] at that time. She underwent a bilateral hip arthroplasty on 08/22/23 and has not been very awake or alert since that time. MD reordered SLP swallow evaluation to determine if she is indeed safe for PO intake. Dx include myocardial injury, possible myocarditis, influenza A, subacute hip fx causing extreme pain.  PTA, pt eating/drinking without difficulty. Her husband has been caring for her the  last eight years. Type of Study: Bedside Swallow Evaluation Previous Swallow Assessment: 08/21/2023 Diet Prior to this Study: Regular;Thin liquids (Level 0) Respiratory Status: Nasal cannula History of Recent Intubation: No Behavior/Cognition: Alert;Cooperative;Pleasant mood;Requires cueing Oral Cavity Assessment: Within Functional Limits Oral Care Completed by SLP: No Oral Cavity - Dentition: Adequate natural dentition Self-Feeding Abilities: Total assist Patient Positioning: Upright in bed Baseline Vocal Quality: Normal Volitional Cough: Cognitively unable to elicit Volitional Swallow: Unable to elicit    Oral/Motor/Sensory Function Overall Oral Motor/Sensory Function: Within functional limits   Ice Chips     Thin Liquid Thin Liquid: Within functional limits Presentation: Straw Other Comments: requires cues from daughter to suck through straw instead of blowing (daughter reports this behavior is baseline secondary to dementia)    Nectar Thick     Honey Thick     Puree Puree: Within functional limits Presentation: Spoon   Solid     Solid: Not tested      Jacqualine Mater, MA, CCC-SLP Speech Therapy

## 2023-08-25 NOTE — Progress Notes (Signed)
    3 Days Post-Op Procedure(s) (LRB): ARTHROPLASTY BIPOLAR HIP (HEMIARTHROPLASTY) (Right)  Subjective:  Patient is nonverbal, her family member is at bedside.  They report that she has been doing pretty well from a pain standpoint.  Tried to mobilize Saturday with PT.  Discussed surgical details and post-operative expectations at length.  Objective:   VITALS:   Vitals:   08/25/23 0200 08/25/23 0300 08/25/23 0400 08/25/23 0540  BP: (!) 173/71 (!) 180/83 (!) 172/68   Pulse: 67 65 66   Resp: (!) 21 20 (!) 21   Temp:    98.2 F (36.8 C)  TempSrc:    Axillary  SpO2: 99% 100% 100%   Weight:      Height:       Nonverbal at baseline, resting though arousable, in NAD Unable to follow commands well with general ROM/sensorineural exam Intact pulses distally Incision: dressing C/D/I and no drainage Compartment soft Wiggles toes spontaneously    Lab Results  Component Value Date   WBC 14.6 (H) 08/25/2023   HGB 10.3 (L) 08/25/2023   HCT 32.9 (L) 08/25/2023   MCV 91.9 08/25/2023   PLT 361 08/25/2023   BMET    Component Value Date/Time   NA 138 08/25/2023 0325   K 3.4 (L) 08/25/2023 0325   CL 110 08/25/2023 0325   CO2 22 08/25/2023 0325   GLUCOSE 115 (H) 08/25/2023 0325   BUN 13 08/25/2023 0325   CREATININE 0.62 08/25/2023 0325   CALCIUM  7.0 (L) 08/25/2023 0325   GFRNONAA >60 08/25/2023 0325    XR: stable post-operative imaging    Assessment/Plan: 3 Days Post-Op   Principal Problem:   Femoral fracture (HCC) Active Problems:   Dementia with behavioral disturbance (HCC)   Elevated LFTs   CKD (chronic kidney disease)   Influenza A   Closed fracture of right hip (HCC)  S/P Right Hip Hemiarthroplasty 08/22/23  Doing well from our standpoint.  Continue per protocol, okay to weight-bear as tolerated.  Post op recs: WB: WBAT   Abx: ancef  x23 hours post op Imaging: PACU xrays Dressing: Aquacel dressing to be kept intact until follow-up DVT prophylaxis: lovenox   starting POD1 x4 weeks Follow up: 2 weeks after surgery for a wound check with Dr. Pryor Browning at [facility].  Address: [address], [place], [place] [address]  Office Phone: [phone]  Albertus Alt 08/25/2023, 10:28 AM

## 2023-08-25 NOTE — Progress Notes (Signed)
 Daily Progress Note   Patient Name: [name]       Date: 08/25/2023 DOB: [birth year]  Age: 78 y.o. MRN#: [id number] Attending Physician: [name]., * Primary Care Physician: Roselind Congo, MD Admit Date: 08/20/2023  Reason for Consultation/Follow-up: Establishing goals of care  Subjective: Awake, no distress, daughter at bedside, pain well controlled, PO intake reasonable and improving.     Length of Stay: 5  Current Medications: Scheduled Meds:  . atorvastatin   40 mg Oral Daily  . enoxaparin  (LOVENOX ) injection  30 mg Subcutaneous Q24H  . feeding supplement  237 mL Oral BID BM  . oseltamivir   30 mg Oral BID  . potassium chloride   40 mEq Oral Q4H  . senna-docusate  2 tablet Oral BID  . sodium chloride  flush  3 mL Intravenous Q12H    Continuous Infusions: . ampicillin -sulbactam (UNASYN ) IV 3 g (08/25/23 0833)  . dextrose  5% lactated ringers  50 mL/hr at 08/24/23 2337    PRN Meds: acetaminophen  **OR** acetaminophen , haloperidol  lactate, HYDROmorphone  (DILAUDID ) injection, mouth rinse, oxyCODONE , polyethylene glycol  Physical Exam         Resting in bed Has dementia Thinly built Regular work of breathing No edema  Vital Signs: BP (!) 172/68   Pulse 66   Temp 98.2 F (36.8 C) (Axillary)   Resp (!) 21   Ht 5' (1.524 m)   Wt 47.6 kg   SpO2 100%   BMI 20.51 kg/m  SpO2: SpO2: 100 % O2 Device: O2 Device: Nasal Cannula O2 Flow Rate: O2 Flow Rate (L/min): 2 L/min  Intake/output summary:  Intake/Output Summary (Last 24 hours) at 08/25/2023 1056 Last data filed at 08/25/2023 0300 Gross per 24 hour  Intake 288.55 ml  Output --  Net 288.55 ml   LBM: Last BM Date : 08/25/23 Baseline Weight: Weight: 47.6 kg Most recent weight: Weight: 47.6 kg        Palliative Assessment/Data:      Patient Active Problem List   Diagnosis Date Noted  . Closed fracture of right hip (HCC) 08/22/2023  . Femoral fracture (HCC) 08/20/2023  . Elevated LFTs 08/20/2023  . CKD (chronic kidney disease) 08/20/2023  . Influenza A 08/20/2023  . Acute metabolic encephalopathy 05/12/2023  . AKI (acute kidney injury) (HCC) 05/12/2023  . Recurrent falls 05/12/2023  .  History of unsteady gait 05/12/2023  . Sensorineural deafness 05/12/2023  . Lactic acidosis 05/12/2023  . Disrupted sleep-wake cycle 12/27/2021  . Unsteady gait 12/27/2021  . Alzheimer disease (HCC) 12/06/2021  . Generalized anxiety disorder 09/25/2021  . Pure hypercholesterolemia 09/25/2021  . Late onset Alzheimer's dementia with behavioral disturbance (HCC) 06/19/2020  . Dementia with behavioral disturbance (HCC) 05/04/2019  . Mixed conductive and sensorineural hearing loss, bilateral 11/17/2017  . Dementia (HCC) 07/24/2017  . Acute blood loss anemia 01/08/2014  . Depression   . Osteoarthritis of right knee 01/07/2014    Palliative Care Assessment & Plan   Patient Profile:    Assessment:  Hip fracture s/p repair: S/P Right Hip Hemiarthroplasty 08/22/23  Influenza Dementia Recently enrolled with hospice.   Recommendations/Plan: Monitor hospital course, PO intake, PT participation Overall, recommend continuation of hospice services    Code Status:    Code Status Orders  (From admission, onward)           Start     Ordered   08/20/23 2026  Full code  Continuous       Question:  By:  Answer:  Other   08/20/23 2026           Code Status History     Date Active Date Inactive Code Status Order ID Comments User Context   05/12/2023 2144 05/14/2023 1718 Full Code [id number]  Jetty Mort, MD ED   01/07/2014 1221 01/10/2014 1339 Full Code [id number]  Lucie Ruts, PA-C Inpatient      Advance Directive Documentation    Flowsheet Row Most Recent Value  Type of  Advance Directive Healthcare Power of Attorney, Living will  Pre-existing out of facility DNR order (yellow form or pink MOST form) --  "MOST" Form in Place? --       Prognosis:  Unable to determine  Discharge Planning: To Be Determined Discussed with daughter - attempts are being made to pursue SNF rehab with palliative.   Care plan was discussed with patient, daughter.   Thank you for allowing the Palliative Medicine Team to assist in the care of this patient.  Mod MDM     Greater than 50%  of this time was spent counseling and coordinating care related to the above assessment and plan.  Lujean Sake, MD  Please contact Palliative Medicine Team phone at [phone] for questions and concerns.

## 2023-08-25 NOTE — [place] FL2 (Signed)
[place] MEDICAID FL2 LEVEL OF CARE FORM     IDENTIFICATION  Patient Name: [name] Birthdate: 1946-05-19 Sex: female Admission Date (Current Location): 08/20/2023  [facility] and [place] Number:  Producer, television/film/video and Address:  [facility],  [address], [place] [address]      Provider Number: [id number]  Attending Physician Name and Address:  Zigmund Daniel., *  Relative Name and Phone Number:  Zahlia, Deshazer (Spouse)  [phone] (Mobile)    Current Level of Care: Hospital Recommended Level of Care: Skilled Nursing Facility Prior Approval Number:    Date Approved/Denied:   PASRR Number: [id number]  Discharge Plan: SNF    Current Diagnoses: Patient Active Problem List   Diagnosis Date Noted   Closed fracture of right hip (HCC) 08/22/2023   Femoral fracture (HCC) 08/20/2023   Elevated LFTs 08/20/2023   CKD (chronic kidney disease) 08/20/2023   Influenza A 08/20/2023   Acute metabolic encephalopathy 05/12/2023   AKI (acute kidney injury) (HCC) 05/12/2023   Recurrent falls 05/12/2023   History of unsteady gait 05/12/2023   Sensorineural deafness 05/12/2023   Lactic acidosis 05/12/2023   Disrupted sleep-wake cycle 12/27/2021   Unsteady gait 12/27/2021   Alzheimer disease (HCC) 12/06/2021   Generalized anxiety disorder 09/25/2021   Pure hypercholesterolemia 09/25/2021   Late onset Alzheimer's dementia with behavioral disturbance (HCC) 06/19/2020   Dementia with behavioral disturbance (HCC) 05/04/2019   Mixed conductive and sensorineural hearing loss, bilateral 11/17/2017   Dementia (HCC) 07/24/2017   Acute blood loss anemia 01/08/2014   Depression    Osteoarthritis of right knee 01/07/2014    Orientation RESPIRATION BLADDER Height & Weight     Self, Time, Situation, Place  Normal Incontinent Weight: 105 lb (47.6 kg) Height:  5' (152.4 cm)  BEHAVIORAL SYMPTOMS/MOOD NEUROLOGICAL BOWEL NUTRITION STATUS      Incontinent Diet  (regular)  AMBULATORY STATUS COMMUNICATION OF NEEDS Skin   Limited Assist Verbally Surgical wounds (closed incision right hip)                       Personal Care Assistance Level of Assistance  Bathing, Feeding, Dressing Bathing Assistance: Limited assistance Feeding assistance: Independent Dressing Assistance: Limited assistance     Functional Limitations Info  Sight, Hearing, Speech Sight Info: Impaired (glasses) Hearing Info: Adequate Speech Info: Adequate    SPECIAL CARE FACTORS FREQUENCY  PT (By licensed PT), OT (By licensed OT)     PT Frequency: 5 x a week OT Frequency: 5 x a week            Contractures Contractures Info: Not present    Additional Factors Info  Code Status, Allergies Code Status Info: full Allergies Info: Aricept (Donepezil)  Sulfa Antibiotics  Wellbutrin (Bupropion)           Current Medications (08/25/2023):  This is the current hospital active medication list Current Facility-Administered Medications  Medication Dose Route Frequency Provider Last Rate Last Admin   acetaminophen (TYLENOL) tablet 650 mg  650 mg Oral Q6H PRN Cecil Cobbs, PA-C   650 mg at 08/24/23 2248   Or   acetaminophen (TYLENOL) suppository 650 mg  650 mg Rectal Q6H PRN Cecil Cobbs, PA-C       Ampicillin-Sulbactam (UNASYN) 3 g in sodium chloride 0.9 % 100 mL IVPB  3 g Intravenous Q8H Cecil Cobbs, PA-C 200 mL/hr at 08/25/23 0833 3 g at 08/25/23 0833   atorvastatin (LIPITOR) tablet 40 mg  40 mg Oral Daily Kathie Dike M, PA-C   40 mg at 08/25/23 0827   dextrose 5 % in lactated ringers infusion   Intravenous Continuous Zigmund Daniel., MD 50 mL/hr at 08/24/23 2337 New Bag at 08/24/23 2337   enoxaparin (LOVENOX) injection 30 mg  30 mg Subcutaneous Q24H Kathie Dike M, PA-C   30 mg at 08/25/23 0830   feeding supplement (ENSURE ENLIVE / ENSURE PLUS) liquid 237 mL  237 mL Oral BID BM Zigmund Daniel., MD       haloperidol  lactate (HALDOL) injection 0.5 mg  0.5 mg Intravenous Q6H PRN Kathie Dike M, PA-C   0.5 mg at 08/22/23 0059   HYDROmorphone (DILAUDID) injection 0.5 mg  0.5 mg Intravenous Q1H PRN Cecil Cobbs, PA-C   0.5 mg at 08/22/23 1610   Oral care mouth rinse  15 mL Mouth Rinse PRN Kathie Dike M, PA-C       oseltamivir (TAMIFLU) capsule 30 mg  30 mg Oral BID Kathie Dike M, PA-C   30 mg at 08/25/23 9604   oxyCODONE (Oxy IR/ROXICODONE) immediate release tablet 2.5-5 mg  2.5-5 mg Oral Q4H PRN Cecil Cobbs, PA-C   5 mg at 08/25/23 1208   polyethylene glycol (MIRALAX / GLYCOLAX) packet 17 g  17 g Oral Daily PRN Kathie Dike M, PA-C       potassium chloride SA (KLOR-CON M) CR tablet 40 mEq  40 mEq Oral Q4H Zigmund Daniel., MD   40 mEq at 08/25/23 1209   senna-docusate (Senokot-S) tablet 2 tablet  2 tablet Oral BID Cecil Cobbs, PA-C   2 tablet at 08/24/23 5409   sodium chloride flush (NS) 0.9 % injection 3 mL  3 mL Intravenous Q12H Kathie Dike M, PA-C   3 mL at 08/24/23 2247     Discharge Medications: Please see discharge summary for a list of discharge medications.  Relevant Imaging Results:  Relevant Lab Results:   Additional Information SSN [id number]  Valentina Shaggy Teneil Shiller, LCSW

## 2023-08-26 DIAGNOSIS — R296 Repeated falls: Secondary | ICD-10-CM | POA: Diagnosis not present

## 2023-08-26 LAB — CBC WITH DIFFERENTIAL/PLATELET
Abs Immature Granulocytes: 0.29 10*3/uL — ABNORMAL HIGH (ref 0.00–0.07)
Basophils Absolute: 0.1 10*3/uL (ref 0.0–0.1)
Basophils Relative: 1 %
Eosinophils Absolute: 0.4 10*3/uL (ref 0.0–0.5)
Eosinophils Relative: 3 %
HCT: 31.8 % — ABNORMAL LOW (ref 36.0–46.0)
Hemoglobin: 10.2 g/dL — ABNORMAL LOW (ref 12.0–15.0)
Immature Granulocytes: 2 %
Lymphocytes Relative: 16 %
Lymphs Abs: 2 10*3/uL (ref 0.7–4.0)
MCH: 29.6 pg (ref 26.0–34.0)
MCHC: 32.1 g/dL (ref 30.0–36.0)
MCV: 92.2 fL (ref 80.0–100.0)
Monocytes Absolute: 1 10*3/uL (ref 0.1–1.0)
Monocytes Relative: 8 %
Neutro Abs: 8.6 10*3/uL — ABNORMAL HIGH (ref 1.7–7.7)
Neutrophils Relative %: 70 %
Platelets: 418 10*3/uL — ABNORMAL HIGH (ref 150–400)
RBC: 3.45 MIL/uL — ABNORMAL LOW (ref 3.87–5.11)
RDW: 13.2 % (ref 11.5–15.5)
WBC: 12.4 10*3/uL — ABNORMAL HIGH (ref 4.0–10.5)
nRBC: 0 % (ref 0.0–0.2)

## 2023-08-26 LAB — CULTURE, BLOOD (ROUTINE X 2)
Culture: NO GROWTH
Culture: NO GROWTH

## 2023-08-26 LAB — COMPREHENSIVE METABOLIC PANEL
ALT: 29 U/L (ref 0–44)
AST: 44 U/L — ABNORMAL HIGH (ref 15–41)
Albumin: 1.9 g/dL — ABNORMAL LOW (ref 3.5–5.0)
Alkaline Phosphatase: 78 U/L (ref 38–126)
Anion gap: 5 (ref 5–15)
BUN: 7 mg/dL — ABNORMAL LOW (ref 8–23)
CO2: 24 mmol/L (ref 22–32)
Calcium: 7.2 mg/dL — ABNORMAL LOW (ref 8.9–10.3)
Chloride: 106 mmol/L (ref 98–111)
Creatinine, Ser: 0.66 mg/dL (ref 0.44–1.00)
GFR, Estimated: >60 mL/min (ref >60)
Glucose, Bld: 95 mg/dL (ref 70–99)
Potassium: 3.8 mmol/L (ref 3.5–5.1)
Sodium: 135 mmol/L (ref 135–145)
Total Bilirubin: 0.9 mg/dL (ref 0.0–1.2)
Total Protein: 5.5 g/dL — ABNORMAL LOW (ref 6.5–8.1)

## 2023-08-26 LAB — MAGNESIUM: Magnesium: 1.6 mg/dL — ABNORMAL LOW (ref 1.7–2.4)

## 2023-08-26 LAB — PHOSPHORUS: Phosphorus: 2.2 mg/dL — ABNORMAL LOW (ref 2.5–4.6)

## 2023-08-26 MED ORDER — K PHOS MONO-SOD PHOS DI & MONO 155-852-130 MG PO TABS
500.0000 mg | ORAL_TABLET | Freq: Four times a day (QID) | ORAL | Status: AC
  Administered 2023-08-26 (×4): 500 mg via ORAL
  Filled 2023-08-26 (×4): qty 2

## 2023-08-26 MED ORDER — MAGNESIUM SULFATE 2 GM/50ML IV SOLN
2.0000 g | Freq: Once | INTRAVENOUS | Status: AC
  Administered 2023-08-26: 2 g via INTRAVENOUS
  Filled 2023-08-26: qty 50

## 2023-08-26 NOTE — Progress Notes (Signed)
[name] rm [address] [facility] hospital liaison note:   [name] is a current [facility] hospice patient with a terminal diagnosis of Alzheimer's Disease with early onset. Patient's husband reported that he was feeding patient breakfast and at the end of the meal, patient threw her head back and her eyes rolled back in her head. [facility] nurse arrived for PRN visit, but EMS had already been called and was on sight. Per husband's request, patient was transferred to the ED to be checked out. Family also reports recent change in mental status post fall at home. Patient was admitted on 2.5.25 with concerns for Influenza A and closed fracture of the right hip. Per Dr. Patric Dykes with [facility] this is a related hospital admission.    Patient is awake in bed this morning and somewhat agitated with mitts on. Patient's husband was at bedside and preparing to help patient get up for breakfast and hopefully get her into the chair today. He is very hopeful that they will be offered a SNF bed soon.    Patient remains inpatient appropriate due to recovery from hip surgery, need for IV fluids and treatment for Flu.   Vital Signs: 98.4/79/18    150/82    spO2 99% 2lpm   I/O:  580/600   Abnormal labs: Ca+ 7.2, Phos 2.2, Albumin 1.9, AST 44, Hgb 10.2, HCT 31.8, Total protein 5.5, CBC 12.4, RBC 3.45, WBC 12.4, BUN 7, Magnesium 1.6   Diagnostics: None New   IV/PRN Meds: Unasyn 3g q 8H IV, D5LR @ 50cc/H IV, Oxycodone 5mg  PO x5   Problem list per progress note Zigmund Daniel., MD 2.10.25 (new note not available):   Assessment & Plan:   Principal Problem:   Femoral fracture (HCC) Active Problems:   Dementia with behavioral disturbance (HCC)   Elevated LFTs   CKD (chronic kidney disease)   Influenza A   Closed fracture of right hip (HCC)   Goals of care Recommended they reconsider DNR status.  Palliative is following, I appreciate their assistance.    Acute Metabolic  Encephalopathy Advanced Dementia At baseline walks without assistive device, but difficult to understand, rarely will say a simple phrase More confused than baseline, suspect delirium in setting of pain, hospitalization, etc Having some post op delirium -> PO intake improved today Will see how she does with therapy Delirium precautions    Right femoral neck fracture with history of recurrent falls At her baseline was walking without assistive device, has been nonambulatory since her fall CT with subacute fx of R femoral neck with substantial displacement and varus angulation S/p R hemiarthroplasty  Post op dvt ppx per ortho, plan for lovenox x4 weeks WBAT Follow with ortho 2 weeks after surgery Continue pain meds, bowel regimen    SVT Suspect atrial fibrillation with RVR Rates improved after amiodarone -> will d/c further amio TSH wnl As afib resolved, this is first known incidence, will monitor off anticoagulation for now.  Could consider d/c with cardiac monitor/zio.   Elevated troponins Cardiology suspects myocardial injury in the setting of her recent femur fracture Echo done without regional wall motion abnormalities, preserved EF Heparin gtt d/c'd per cards increased risk for future morbidity mortality per cards   Elevated CK levels.  downtrending   Influenza A Concern for Possible Superimposed Pneumonia CT chest with nodular and branching opacities in the LUL favoring atypical infectious bronchiolitis, bronchitis or reactive airways disease Tamiflu Unasyn for possible pneumonia (7 day course) Blood cultures  NGx3 SLP eval recommending thin liquid, age appropriate regular diet   CKD (chronic kidney disease) Creatinine improved today   Elevated LFTs Will trend, mild Suspect hemodynamically mediated      Discharge Planning:? Ongoing vs skilled rehab   Family contact: talked with husband at bedside    IDT:? Updated?     Goals of Care: full code   If patient  requires EMS transport at discharge, please [facility] as that is who [facility] is contracted with for transport.   Please call with any hospice related questions or concerns. Henderson Newcomer, LPN Hospice hospital liaison [phone]

## 2023-08-26 NOTE — Progress Notes (Signed)
PROGRESS NOTE    [name]  WNU:[id number] DOB: 06/24/1946 DOA: 08/20/2023 PCP: Noberto Retort, MD  Chief Complaint  Patient presents with   FLU  SX   Weakness   Urinary Frequency    Brief Narrative:   [name] is [name] 78 y.o. female with medical history significant of dementia, CKD presented to the hospital after [name] fall that was [name] week back when she had gone to urgent care center and since then she has not been mobile.  She was also having cough for [name] week and had sick contact with her husband.  It sounds like she was enrolled in hospice due to her decline, but she went to the ED and was noted to have [name] R hip fracture and the flu.  Family has since rescinded the DNR.  She's now s/p surgery with ortho for her R hip fracture.  Discharge at this time pending placement.  Will see how she does with therapy.    See below for additional details  Assessment & Plan:   Principal Problem:   Femoral fracture (HCC) Active Problems:   Dementia with behavioral disturbance (HCC)   Elevated LFTs   CKD (chronic kidney disease)   Influenza [name]   Closed fracture of right hip (HCC)  Goals of care  Dispo Recommended they reconsider DNR status.  Palliative is following, I appreciate their assistance.  Please give patient's husband opportunity to call daughter on speaker while in room.  Hopefully will be able to d/c to SNF by end of week pending progress.  Acute Metabolic Encephalopathy Advanced Dementia At baseline walks without assistive device, but difficult to understand, rarely will say [name] simple phrase More confused than baseline, suspect delirium in setting of pain, hospitalization, etc Still with some post op delirium -> PO intake has improved Will see how she does with therapy - have tried to provide realistic expectations for her potential improvement  Delirium precautions   Right femoral neck fracture with history of recurrent falls At her baseline was walking without assistive  device, has been nonambulatory since her fall CT with subacute fx of R femoral neck with substantial displacement and varus angulation S/p R hemiarthroplasty  Post op dvt ppx per ortho, plan for lovenox x4 weeks WBAT Follow with ortho 2 weeks after surgery Continue pain meds, bowel regimen   SVT Suspect atrial fibrillation with RVR Rates improved after amiodarone -> will d/c further amio TSH wnl As afib resolved, this is first known incidence, will monitor off anticoagulation for now.  Could consider d/c with cardiac monitor/zio.  Elevated troponins Cardiology suspects myocardial injury in the setting of her recent femur fracture Echo done without regional wall motion abnormalities, preserved EF Heparin gtt d/c'd per cards increased risk for future morbidity mortality per cards   Elevated CK levels.  downtrending   Influenza [name] Concern for Possible Superimposed Pneumonia CT chest with nodular and branching opacities in the LUL favoring atypical infectious bronchiolitis, bronchitis or reactive airways disease Tamiflu Unasyn for possible pneumonia (7 day course) Blood cultures NGx3 SLP eval recommending thin liquid, age appropriate regular diet Wean o2 as tolerated   Hypomagnesemia Replace and follow   CKD stage IIIb stable   Elevated LFTs Will trend, mild Suspect hemodynamically mediated     DVT prophylaxis: SCD Code Status: full Family Communication: husband, daughter Disposition:   Status is: Inpatient Remains inpatient appropriate because: need for inpt care   Consultants:  Orthopedics cardiology  Procedures:  Echo IMPRESSIONS  1. Left ventricular ejection fraction, by estimation, is 60 to 65%. The  left ventricle has normal function. The left ventricle has no regional  wall motion abnormalities. Left ventricular diastolic parameters were  normal.   2. Right ventricular systolic function is normal. The right ventricular  size is normal.   3. The  mitral valve is normal in structure. Mild mitral valve  regurgitation. No evidence of mitral stenosis.   4. The aortic valve is normal in structure. Aortic valve regurgitation is  mild. No aortic stenosis is present.   5. The inferior vena cava is normal in size with greater than 50%  respiratory variability, suggesting right atrial pressure of 3 mmHg.   Antimicrobials:  Anti-infectives (From admission, onward)    Start     Dose/Rate Route Frequency Ordered Stop   08/22/23 2100  ceFAZolin (ANCEF) IVPB 2g/100 mL premix        2 g 200 mL/hr over 30 Minutes Intravenous Every 8 hours 08/22/23 1613 08/24/23 0700   08/22/23 0800  Ampicillin-Sulbactam (UNASYN) 3 g in sodium chloride 0.9 % 100 mL IVPB        3 g 200 mL/hr over 30 Minutes Intravenous Every 8 hours 08/22/23 0732 08/27/23 1445   08/22/23 0600  ceFAZolin (ANCEF) IVPB 2g/100 mL premix        2 g 200 mL/hr over 30 Minutes Intravenous On call to O.R. 08/21/23 1841 08/22/23 1316   08/20/23 2300  oseltamivir (TAMIFLU) capsule 30 mg        30 mg Oral 2 times daily 08/20/23 2250 08/25/23 2159   08/20/23 2200  Ampicillin-Sulbactam (UNASYN) 3 g in sodium chloride 0.9 % 100 mL IVPB  Status:  Discontinued        3 g 200 mL/hr over 30 Minutes Intravenous Every 12 hours 08/20/23 2159 08/22/23 0732       Subjective:  Did well taking PO Still confused Family has picked [name] SNF  Objective: Vitals:   08/25/23 1800 08/25/23 2000 08/26/23 0550 08/26/23 1210  BP: 107/64  (!) 150/82   Pulse: 70  79   Resp: 13  18   Temp:  98.6 F (37 C) 98.4 F (36.9 C) 97.8 F (36.6 C)  TempSrc:  Axillary Oral Axillary  SpO2: 99%     Weight:      Height:        Intake/Output Summary (Last 24 hours) at 08/26/2023 1548 Last data filed at 08/26/2023 0800 Gross per 24 hour  Intake 520 ml  Output --  Net 520 ml   Filed Weights   08/20/23 1358 08/22/23 1249  Weight: 47.6 kg 47.6 kg    Examination:  General: No acute distress. Cardiovascular:  RRR Lungs: unlabored Abdomen: Soft, nontender, nondistended Neurological: delirious, moving all extremities   Extremities: dressing to RLE  Data Reviewed: I have personally reviewed following labs and imaging studies  CBC: Recent Labs  Lab 08/20/23 1502 08/20/23 2026 08/22/23 1041 08/23/23 0410 08/24/23 0428 08/25/23 0325 08/26/23 0434  WBC 13.0*   < > 10.6* 9.5 15.9* 14.6* 12.4*  NEUTROABS 10.7*  --   --  6.6 12.9* 10.9* 8.6*  HGB 14.4   < > 11.5* 10.2* 10.6* 10.3* 10.2*  HCT 43.6   < > 36.8 32.0* 33.6* 32.9* 31.8*  MCV 90.5   < > 94.4 91.7 91.1 91.9 92.2  PLT 335   < > 301 263 335 361 418*   < > = values in this interval not displayed.  Basic Metabolic Panel: Recent Labs  Lab 08/22/23 1041 08/23/23 0410 08/24/23 0428 08/25/23 0325 08/26/23 0434  NA 144 139 143 138 135  K 3.8 3.0* 3.7 3.4* 3.8  CL 112* 111 114* 110 106  CO2 23 22 23 22 24   GLUCOSE 106* 105* 119* 115* 95  BUN 21 16 13 13  7*  CREATININE 0.72 0.67 0.62 0.62 0.66  CALCIUM 7.8* 7.1* 7.2* 7.0* 7.2*  MG 2.0 1.7 1.7 1.7 1.6*  PHOS 2.4*  --  2.1* 1.9* 2.2*    GFR: Estimated Creatinine Clearance: 41 mL/min (by C-G formula based on SCr of 0.66 mg/dL).  Liver Function Tests: Recent Labs  Lab 08/21/23 0300 08/23/23 0410 08/24/23 0428 08/25/23 0325 08/26/23 0434  AST 101* 53* 43* 54* 44*  ALT 50* 36 21 27 29   ALKPHOS 61 44 61 90 78  BILITOT 0.9 0.8 1.0 0.7 0.9  PROT 6.6 4.9* 5.2* 5.3* 5.5*  ALBUMIN 2.6* 1.9* 2.0* 1.8* 1.9*    CBG: Recent Labs  Lab 08/22/23 0304  GLUCAP 85     Recent Results (from the past 240 hours)  Resp panel by RT-PCR (RSV, Flu Tank Difiore&B, Covid) Anterior Nasal Swab     Status: Abnormal   Collection Time: 08/20/23  1:50 PM   Specimen: Anterior Nasal Swab  Result Value Ref Range Status   SARS Coronavirus 2 by RT PCR NEGATIVE NEGATIVE Final   Influenza [facility] [place] by PCR POSITIVE (Lan Mcneill) NEGATIVE Final   Influenza B by PCR NEGATIVE NEGATIVE Final    Comment: (NOTE) The Xpert Xpress  SARS-CoV-2/FLU/RSV plus assay is intended as an aid in the diagnosis of influenza from Nasopharyngeal swab specimens and should not be used as Trelon Plush sole basis for treatment. Nasal washings and aspirates are unacceptable for Xpert Xpress SARS-CoV-2/FLU/RSV testing.  Fact Sheet for Patients: BloggerCourse.com  Fact Sheet for Healthcare Providers: SeriousBroker.it  This test is not yet approved or cleared by the [place] FDA and has been authorized for detection and/or diagnosis of SARS-CoV-2 by FDA under an Emergency Use Authorization (EUA). This EUA will remain in effect (meaning this test can be used) for the duration of the COVID-19 declaration under Section 564(b)(1) of the Act, 21 U.S.C. section 360bbb-3(b)(1), unless the authorization is terminated or revoked.     Resp Syncytial Virus by PCR NEGATIVE NEGATIVE Final    Comment: (NOTE) Fact Sheet for Patients: BloggerCourse.com  Fact Sheet for Healthcare Providers: SeriousBroker.it  This test is not yet approved or cleared by the [place] FDA and has been authorized for detection and/or diagnosis of SARS-CoV-2 by FDA under an Emergency Use Authorization (EUA). This EUA will remain in effect (meaning this test can be used) for the duration of the COVID-19 declaration under Section 564(b)(1) of the Act, 21 U.S.C. section 360bbb-3(b)(1), unless the authorization is terminated or revoked.  Performed at [facility], [address], [place], [place] [address]   Culture, blood (Routine X 2) w Reflex to ID Panel     Status: None   Collection Time: 08/20/23 11:00 PM   Specimen: BLOOD LEFT ARM  Result Value Ref Range Status   Specimen Description BLOOD LEFT ARM  Final   Special Requests   Final    BOTTLES DRAWN AEROBIC AND ANAEROBIC Blood Culture results may not be optimal due to an inadequate volume of blood received  in culture bottles   Culture   Final    NO GROWTH 6 DAYS Performed at [facility], [address] [place], [place] [address]    Report Status 08/26/2023 FINAL  Final  Culture, blood (Routine X 2) w Reflex to ID Panel     Status: None   Collection Time: 08/20/23 11:00 PM   Specimen: BLOOD RIGHT ARM  Result Value Ref Range Status   Specimen Description BLOOD RIGHT ARM  Final   Special Requests   Final    BOTTLES DRAWN AEROBIC AND ANAEROBIC Blood Culture results may not be optimal due to an inadequate volume of blood received in culture bottles   Culture   Final    NO GROWTH 6 DAYS Performed at [facility], [address], [place], [place] [address]    Report Status 08/26/2023 FINAL  Final         Radiology Studies: No results found.       Scheduled Meds:  atorvastatin  40 mg Oral Daily   enoxaparin (LOVENOX) injection  30 mg Subcutaneous Q24H   feeding supplement  237 mL Oral BID BM   phosphorus  500 mg Oral QID   Ballard-docusate  2 tablet Oral BID   sodium chloride flush  3 mL Intravenous Q12H   Continuous Infusions:  ampicillin-sulbactam (UNASYN) IV 3 g (08/26/23 0857)     LOS: 6 days    Time spent: over 30 min    Lacretia Nicks, MD [facility]   To contact the attending provider between 7A-7P or the covering provider during after hours 7P-7A, please log into the web site [email or web address] and access using universal [place] password for that web site. If you do not have the password, please call the hospital operator.  08/26/2023, 3:48 PM

## 2023-08-26 NOTE — TOC Progression Note (Signed)
Transition of Care [facility]) - Progression Note    Patient Details  Name: [name] MRN: [id number] Date of Birth: 01-23-1946  Transition of Care [facility]) CM/SW Contact  Larrie Kass, LCSW Phone Number: 08/26/2023, 10:33 AM  Clinical Narrative:     CSW presented bed offers with Medicare star ratting  to pt's spouse and daughter, they are requesting time to review. TOC to follow.    [facility] [address] [place], [place] [address] [phone] Overall rating? Below average  [facility] [address] [place], [place] [address] [phone] Overall rating ???? Above average    Expected Discharge Plan: Skilled Nursing Facility Barriers to Discharge: Continued Medical Work up  Expected Discharge Plan and Services       Living arrangements for the past 2 months: Single Family Home                                       Social Determinants of Health (SDOH) Interventions SDOH Screenings   Food Insecurity: Patient Unable To Answer (08/21/2023)  Housing: Patient Unable To Answer (08/21/2023)  Transportation Needs: Patient Unable To Answer (08/21/2023)  Utilities: Patient Unable To Answer (08/21/2023)  Social Connections: Patient Unable To Answer (08/21/2023)  Tobacco Use: Low Risk  (08/22/2023)    Readmission Risk Interventions     No data to display

## 2023-08-27 DIAGNOSIS — J101 Influenza due to other identified influenza virus with other respiratory manifestations: Secondary | ICD-10-CM | POA: Diagnosis not present

## 2023-08-27 DIAGNOSIS — F02C18 Dementia in other diseases classified elsewhere, severe, with other behavioral disturbance: Secondary | ICD-10-CM | POA: Diagnosis not present

## 2023-08-27 DIAGNOSIS — Z4789 Encounter for other orthopedic aftercare: Secondary | ICD-10-CM | POA: Diagnosis not present

## 2023-08-27 DIAGNOSIS — G309 Alzheimer's disease, unspecified: Secondary | ICD-10-CM | POA: Diagnosis not present

## 2023-08-27 DIAGNOSIS — Z9181 History of falling: Secondary | ICD-10-CM | POA: Diagnosis not present

## 2023-08-27 DIAGNOSIS — N179 Acute kidney failure, unspecified: Secondary | ICD-10-CM

## 2023-08-27 DIAGNOSIS — F419 Anxiety disorder, unspecified: Secondary | ICD-10-CM | POA: Diagnosis not present

## 2023-08-27 DIAGNOSIS — R296 Repeated falls: Secondary | ICD-10-CM | POA: Diagnosis not present

## 2023-08-27 DIAGNOSIS — S72001A Fracture of unspecified part of neck of right femur, initial encounter for closed fracture: Secondary | ICD-10-CM | POA: Diagnosis not present

## 2023-08-27 DIAGNOSIS — F03918 Unspecified dementia, unspecified severity, with other behavioral disturbance: Secondary | ICD-10-CM

## 2023-08-27 DIAGNOSIS — R7989 Other specified abnormal findings of blood chemistry: Secondary | ICD-10-CM

## 2023-08-27 DIAGNOSIS — M6259 Muscle wasting and atrophy, not elsewhere classified, multiple sites: Secondary | ICD-10-CM | POA: Diagnosis not present

## 2023-08-27 DIAGNOSIS — S72002D Fracture of unspecified part of neck of left femur, subsequent encounter for closed fracture with routine healing: Secondary | ICD-10-CM | POA: Diagnosis not present

## 2023-08-27 DIAGNOSIS — M6281 Muscle weakness (generalized): Secondary | ICD-10-CM | POA: Diagnosis not present

## 2023-08-27 MED ORDER — LORAZEPAM 0.5 MG PO TABS
0.5000 mg | ORAL_TABLET | Freq: Every evening | ORAL | Status: DC | PRN
Start: 2023-08-27 — End: 2023-08-28
  Administered 2023-08-27: 0.5 mg via ORAL
  Filled 2023-08-27: qty 1

## 2023-08-27 NOTE — Progress Notes (Signed)
    5 Days Post-Op Procedure(s) (LRB): ARTHROPLASTY BIPOLAR HIP (HEMIARTHROPLASTY) (Right)  Subjective:  Patient is nonverbal, her family member is at bedside.  Patient resting, had a tough time sleeping due to dementia per family member.  No overnight events.  Mobilized some with PT on Monday.  Likely DC to SNF.  Objective:   VITALS:   Vitals:   08/26/23 0550 08/26/23 1210 08/26/23 2101 08/27/23 0600  BP: (!) 150/82  (!) 148/78 (!) 182/77  Pulse: 79  77 78  Resp: 18  18 20   Temp: 98.4 F (36.9 C) 97.8 F (36.6 C) 99 F (37.2 C) 98.2 F (36.8 C)  TempSrc: Oral Axillary Oral Oral  SpO2:   98% 97%  Weight:      Height:       Nonverbal at baseline, resting though arousable, in NAD Unable to follow commands well with general ROM/sensorineural exam Intact pulses distally Incision: dressing C/D/I and no drainage Compartment soft Wiggles toes spontaneously    Lab Results  Component Value Date   WBC 12.4 (H) 08/26/2023   HGB 10.2 (L) 08/26/2023   HCT 31.8 (L) 08/26/2023   MCV 92.2 08/26/2023   PLT 418 (H) 08/26/2023   BMET    Component Value Date/Time   NA 135 08/26/2023 0434   K 3.8 08/26/2023 0434   CL 106 08/26/2023 0434   CO2 24 08/26/2023 0434   GLUCOSE 95 08/26/2023 0434   BUN 7 (L) 08/26/2023 0434   CREATININE 0.66 08/26/2023 0434   CALCIUM 7.2 (L) 08/26/2023 0434   GFRNONAA >60 08/26/2023 0434    XR: stable post-operative imaging    Assessment/Plan: 5 Days Post-Op   Principal Problem:   Femoral fracture (HCC) Active Problems:   Dementia with behavioral disturbance (HCC)   Fall in elderly patient   Elevated LFTs   CKD (chronic kidney disease)   Influenza A   Closed fracture of right hip (HCC)  S/P Right Hip Hemiarthroplasty 08/22/23  Doing well from our standpoint.  Continue per protocol, okay to weight-bear as tolerated.  Post op recs: WB: WBAT   Abx: ancef x23 hours post op Imaging: PACU xrays Dressing: Aquacel dressing to be kept  intact until follow-up DVT prophylaxis: lovenox starting POD1 x4 weeks DC: Per medicine, likely SNF Follow up: 2 weeks after surgery for a wound check with Dr. Blanchie Dessert at [facility].  Address: [address], [place], [place] [address]  Office Phone: [phone]  Cecil Cobbs 08/27/2023, 7:39 AM

## 2023-08-27 NOTE — TOC Progression Note (Addendum)
Transition of Care [facility]) - Progression Note    Patient Details  Name: [name] MRN: [id number] Date of Birth: [birth year]  Transition [facility]) CM/SW Contact  Larrie Kass, LCSW Phone Number: 08/27/2023, 3:34 PM  Clinical Narrative:     CSW spoke with the pt's spouse, and they have chosen [facility]. CSW attempted to call [facility] to inquire when the pt can discharge to the facility, but there was no answer. A message was sent, CSW awaiting a response. TOC to folow.   Adden 3:40pm Received response back from facility, they are unable to take pt today , but can admit her in the morning. TOC to follow.  Expected Discharge Plan: Skilled Nursing Facility Barriers to Discharge: Continued Medical Work up  Expected Discharge Plan and Services       Living arrangements for the past 2 months: Single Family Home                                       Social Determinants of Health (SDOH) Interventions SDOH Screenings   Food Insecurity: Patient Unable To Answer (08/21/2023)  Housing: Patient Unable To Answer (08/21/2023)  Transportation Needs: Patient Unable To Answer (08/21/2023)  Utilities: Patient Unable To Answer (08/21/2023)  Social Connections: Patient Unable To Answer (08/21/2023)  Tobacco Use: Low Risk  (08/22/2023)    Readmission Risk Interventions     No data to display

## 2023-08-27 NOTE — Progress Notes (Addendum)
[name] [address] [facility] hospital liaison note:   [name] is a current [facility] hospice patient with a terminal diagnosis of Alzheimer's Disease with early onset. Patient's husband reported that he was feeding patient breakfast and at the end of the meal, patient threw her head back and her eyes rolled back in her head. [facility] nurse arrived for PRN visit, but EMS had already been called and was on sight. Per husband's request, patient was transferred to the ED to be checked out. Family also reports recent change in mental status post fall at home. Patient was admitted on 2.5.25 with concerns for Influenza A and closed fracture of the right hip. Per Dr. Patric Dykes with [facility] this is a related hospital admission.    Patient is in and out of sleep this morning with somewhat more labored breathing. Daughter is at bedside and reports that patient had a tough time last night with restlessness and some agitation. Family has chosen a SNF and is awaiting next steps. Patient is receiving frequent pain medication and is still on IV antibiotics.    Patient remains inpatient appropriate due to recovery from hip surgery, need for IV fluids and treatment for Flu.   Vital Signs: 98.2/80/15    144/64    spO2 95% on RA   I/O:  1871/300   Abnormal labs: none new   Diagnostics: none new   IV/PRN Meds: Unasyn 3g q 8H IV, Oxycodone 5mg  PO x4   Problem list per progress note Zigmund Daniel., MD 2.11.25:    Assessment & Plan:   Principal Problem:   Femoral fracture (HCC) Active Problems:   Dementia with behavioral disturbance (HCC)   Elevated LFTs   CKD (chronic kidney disease)   Influenza A   Closed fracture of right hip (HCC)   Goals of care  Dispo Recommended they reconsider DNR status.  Palliative is following, I appreciate their assistance.  Please give patient's husband opportunity to call daughter on speaker while in room.  Hopefully will be able to d/c to SNF  by end of week pending progress.   Acute Metabolic Encephalopathy Advanced Dementia At baseline walks without assistive device, but difficult to understand, rarely will say a simple phrase More confused than baseline, suspect delirium in setting of pain, hospitalization, etc Still with some post op delirium -> PO intake has improved Will see how she does with therapy - have tried to provide realistic expectations for her potential improvement  Delirium precautions    Right femoral neck fracture with history of recurrent falls At her baseline was walking without assistive device, has been nonambulatory since her fall CT with subacute fx of R femoral neck with substantial displacement and varus angulation S/p R hemiarthroplasty  Post op dvt ppx per ortho, plan for lovenox x4 weeks WBAT Follow with ortho 2 weeks after surgery Continue pain meds, bowel regimen    SVT Suspect atrial fibrillation with RVR Rates improved after amiodarone -> will d/c further amio TSH wnl As afib resolved, this is first known incidence, will monitor off anticoagulation for now.  Could consider d/c with cardiac monitor/zio.   Elevated troponins Cardiology suspects myocardial injury in the setting of her recent femur fracture Echo done without regional wall motion abnormalities, preserved EF Heparin gtt d/c'd per cards increased risk for future morbidity mortality per cards   Elevated CK levels.  downtrending   Influenza A Concern for Possible Superimposed Pneumonia CT chest with nodular and branching opacities in the  LUL favoring atypical infectious bronchiolitis, bronchitis or reactive airways disease Tamiflu Unasyn for possible pneumonia (7 day course) Blood cultures NGx3 SLP eval recommending thin liquid, age appropriate regular diet Wean o2 as tolerated   Hypomagnesemia Replace and follow    CKD stage IIIb stable   Elevated LFTs Will trend, mild Suspect hemodynamically mediated     Discharge Planning:?skilled rehab   Family contact: talked with daughter at bedside    IDT:? Updated?     Goals of Care: full code   If patient requires EMS transport at discharge, please [facility] as that is who [facility] is contracted with for transport.   Please call with any hospice related questions or concerns. Henderson Newcomer, LPN Hospice hospital liaison [phone]

## 2023-08-27 NOTE — Progress Notes (Signed)
PROGRESS NOTE    [name]  ZOX:[id number] DOB: 1946-04-16 DOA: 08/20/2023 PCP: Noberto Retort, MD  Subjective: Pt seen and examined. Met with pt's husband at bedside. He is hoping for [employer] for patient's rehab. No concerns. Ready for her to go to rehab   Hospital Course: [name] is a 78 y.o. female with medical history significant of dementia, CKD presented to the hospital after a fall that was a week back when she had gone to urgent care center and since then she has not been mobile.  She was also having cough for a week and had sick contact with her husband.  At home at some point hospice was engaged but then family wished to have second opinion in the ED.  In the ED, CT scan of the abdomen and pelvis was done which revealed right femoral neck fracture.  Family rescinded DO NOT RESUSCITATE order and Ortho was consulted.  Assessment and plan   Femoral fracture with history of recurrent falls. CT scan of the abdomen pelvis with acute/subacute right femoral neck fracture subacute occult with angulation.  Orthopedics has been consulted and plan for palliative hip hemiarthroplasty if with as per the family..  Continue pain medication regimen.  Sed rate elevated at 34.  Elevated CK levels.  Continue with hydration.  Monitor closely.  Elevated troponins.  Seen by cardiology.  Possible myocarditis from influenza A.  On heparin.  Check 2D echocardiogram.   Influenza A Mild leukocytosis and elevated lactate on presentation..  CT scan with  possible left upper lobe opacity and nodular fashion possible infectious bronchiolitis.  Outside the window for Tamiflu.  Empirically on Unasyn for possible pneumonia..  O check a speech and swallow.  Blood cultures negative in less than 12 hours.  Continue with hydration.  CKD (chronic kidney disease) Initial creatinine of 1.15 with BUN of 39.   received IV fluids.  Elevated LFTs Likely secondary to elevated CK levels.  Continue to  monitor.   Dementia with behavioral disturbance  Behavioral disturbance with new for 1 week likely secondary to pain.  Cocci Contin and Tylenol with some Dilaudid.  Might need Haldol for agitation.  Delirium precautions.  Might benefit from ongoing discussion about goals of care.   HPI: [name] is a 78 y.o. female with medical history significant of dementia.  Till about a week ago patient was ambulatory as per husband son and daughter at the bedside.  History principally from the patient's family as well as from ER signout and record review.  Apparently patient had a fall about a week ago for which patient was seen at an urgent care facility.  She had an x-ray of her?  Hips done.  Apparently the x-rays were negative and patient was discharged home.  Unfortunately patient did not get out of bed since then.  Patient has been cared for in the bed by family since then.  At some point hospice was engaged and family understood that patient was going to pass away soon with comfort measures.  Daughter decided to insist that the patient get a second opinion at the ER and therefore presents here.   ER course is notable for patient being found to be markedly apprehensive, occasionally yelling - pretty much with any touch below the pelvis.  As per family this has been the case for at least a week.  Also patient had a CAT scan done of the abdomen pelvis.  Which revealed a right femoral neck fracture.  Family has RECINDED DO NOT RESUSCITATE order.  Ortho has been engaged.  Medical evaluation is sought.   Unfortunately history from the patient cannot be obtained.  Per family she was talkative when she came to the ER but since getting the Versed and fentanyl she has not been talking.   Patinet has had a cough wet X 1 week. Sick contact with husband. No fver, no tachypnea.  Significant Events: Admitted 08/20/2023 for right femoral neck fracture  Significant Labs: Influenza A POSITIVE WBC 13, HgB 14.4, Plt  335 Na 143, K 3.4, CO2 of 25, BUN 39, Scr 1.15, glu 103 AST 109, ALT 53, TP 6.9, alb 2.8  Significant Imaging Studies: CT head/c-spine  No acute intracranial abnormalities. Chronic atrophy and small vessel ischemic changes similar to prior studies. 2. Prominent degenerative changes throughout the cervical spine. No acute displaced fractures are identified. CT chest/abd/pelvis Subacute fracture of the RIGHT femoral neck is substantially displaced and  demonstrates varus angulation. 2. Nodular and branching opacities in the left upper lobe favoring atypical infectious bronchiolitis given the clustered appearance, largest individual nodule 0.8 by 0.6 cm. 3. Airway thickening is present, suggesting bronchitis or reactive airways disease. Layering mucus in the right mainstem bronchus. 4. Degenerative  glenohumeral arthropathy bilaterally. 5. 2.6 cm calcified uterine fibroid. 6.  Aortic Atherosclerosis   Antibiotic Therapy: Anti-infectives (From admission, onward)    Start     Dose/Rate Route Frequency Ordered Stop   08/22/23 2100  ceFAZolin (ANCEF) IVPB 2g/100 mL premix        2 g 200 mL/hr over 30 Minutes Intravenous Every 8 hours 08/22/23 1613 08/24/23 0700   08/22/23 0800  Ampicillin-Sulbactam (UNASYN) 3 g in sodium chloride 0.9 % 100 mL IVPB        3 g 200 mL/hr over 30 Minutes Intravenous Every 8 hours 08/22/23 0732 08/27/23 1445   08/22/23 0600  ceFAZolin (ANCEF) IVPB 2g/100 mL premix        2 g 200 mL/hr over 30 Minutes Intravenous On call to O.R. 08/21/23 1841 08/22/23 1316   08/20/23 2300  oseltamivir (TAMIFLU) capsule 30 mg        30 mg Oral 2 times daily 08/20/23 2250 08/25/23 2159   08/20/23 2200  Ampicillin-Sulbactam (UNASYN) 3 g in sodium chloride 0.9 % 100 mL IVPB  Status:  Discontinued        3 g 200 mL/hr over 30 Minutes Intravenous Every 12 hours 08/20/23 2159 08/22/23 0732       Procedures: 08-22-2023 right hip  hemiarthroplasty  Consultants: Ortho Cardiology Palliative Care    Assessment and Plan: * Right femoral fracture [facility]) - s/p right hip hemiarthroplasty on 08-22-2023 On admission through 08-26-2023.  Please see HPI, patient found to have acute/subacute right femoral neck fracture subacute occult with angulation.  Closed.  Ortho has been engaged.  For pain medication I will order standing acetaminophen as well as OxyContin.  Bowel regimen has been ordered.  I will also order as needed Dilaudid for comfort.  Given patient's marked apprehension, concern for missing further occult fractures.  Therefore I will order bilateral tib-fib x-rays.  Patient was able to be reevaluated afterwards and it does not look like the foot has any fracture..  Look forward to orthopedic evaluation in this regard. DVT prophylaxis with Lovenox ordered every evening.  08-27-2023 family is planning on SNF at discharge. Discussed with husband.Post op dvt ppx per ortho, plan for lovenox x4 weeks. WBAT. Follow with ortho 2 weeks after surgery  Fall in elderly patient Resulted in right femur fracture.  Influenza A On admission through 08-27-2023.  Husband reports patient has been coughing with for about a week.  Patient is noted to have leukocytosis as well as elevated lactic acidosis here.  On the CAT scan.  I reviewed the images myself.  Does not look like the patient has a pneumonia, although she does have some left upper lobe branching opacity and nodular fashion.  Which may be infectious bronchiolitis.  Will treat with Unasyn at this time, patient is outside the window to start treatment with oseltamivir.  Monitor clinically.  Will check swallow evaluation  08-27-2023 stable. On RA.  AKI (acute kidney injury) (HCC) On admission, Baseline GFR seems to be just below 60 based on labs in 3 months ago.  Patient current creatinine is 1.15 with BUN of 39.  I believe patient may be dehydrated from her right femoral neck fracture  as well as some ongoing pain.  Patient received 1 L of saline in the ER.  I will order another 1 L to be infused over the next 10 hours.  We will trend the creatinine.  Potassium will be really repleted orally.  08-27-2023 stable.   Elevated LFTs On admission through 08-27-2023.  Mild new. Given history, check CK first. Ok to give acetaminophen. Check inr ptt. Trend.  08-27-2023 AST 44 , ALT 29. Stable.  Dementia with behavioral disturbance (HCC) On admission through 08-26-2023.  Behavioral disturbance seems to be new in the last 1 week and I would attribute largely to pain.  Therefore standing OxyContin and acetaminophen has been ordered.  Also some Dilaudid.  However it is possible that the patient's behavioral disturbance may interfere with medical therapies necessary for the patient.  Therefore I will order as needed haloperidol when patient is not able to be redirected by family.  Seroquel for insomnia X 48 hours. At baseline walks without assistive device, but difficult to understand, rarely will say a simple phrase More confused than baseline, suspect delirium in setting of pain, hospitalization, etc Still with some post op delirium -> PO intake has improved Will see how she does with therapy - have tried to provide realistic expectations for her potential improvement  Delirium precautions   08-27-2023 stable. Delirium seems to be clearing. Pt still has advanced dementia.       DVT prophylaxis: enoxaparin (LOVENOX) injection 30 mg Start: 08/23/23 0900 SCDs Start: 08/20/23 2026    Code Status: Full Code Family Communication: discussed with husband James at bedside Disposition Plan: SNF Reason for continuing need for hospitalization: medically stable.  Objective: Vitals:   08/27/23 0600 08/27/23 1000 08/27/23 1100 08/27/23 1329  BP: (!) 182/77 (!) 144/64  (!) 163/66  Pulse: 78 80  68  Resp: 20 15    Temp: 98.2 F (36.8 C)   98.4 F (36.9 C)  TempSrc: Oral   Oral  SpO2:  97% 98% 95% 94%  Weight:      Height:        Intake/Output Summary (Last 24 hours) at 08/27/2023 1720 Last data filed at 08/27/2023 1100 Gross per 24 hour  Intake 580 ml  Output --  Net 580 ml   Filed Weights   08/20/23 1358 08/22/23 1249  Weight: 47.6 kg 47.6 kg    Examination:  Physical Exam Vitals and nursing note reviewed.  Constitutional:      General: She is not in acute distress.    Appearance: She is not toxic-appearing or diaphoretic.  Comments: Chronically ill appearing  HENT:     Head: Normocephalic.     Nose: Nose normal.  Cardiovascular:     Rate and Rhythm: Normal rate and regular rhythm.  Pulmonary:     Effort: Pulmonary effort is normal. No respiratory distress.     Breath sounds: Normal breath sounds.  Abdominal:     General: Bowel sounds are normal. There is no distension.     Palpations: Abdomen is soft.  Skin:    General: Skin is warm and dry.     Capillary Refill: Capillary refill takes less than 2 seconds.  Neurological:     Mental Status: She is disoriented.     Data Reviewed: I have personally reviewed following labs and imaging studies  CBC: Recent Labs  Lab 08/22/23 1041 08/23/23 0410 08/24/23 0428 08/25/23 0325 08/26/23 0434  WBC 10.6* 9.5 15.9* 14.6* 12.4*  NEUTROABS  --  6.6 12.9* 10.9* 8.6*  HGB 11.5* 10.2* 10.6* 10.3* 10.2*  HCT 36.8 32.0* 33.6* 32.9* 31.8*  MCV 94.4 91.7 91.1 91.9 92.2  PLT 301 263 335 361 418*   Basic Metabolic Panel: Recent Labs  Lab 08/22/23 1041 08/23/23 0410 08/24/23 0428 08/25/23 0325 08/26/23 0434  NA 144 139 143 138 135  K 3.8 3.0* 3.7 3.4* 3.8  CL 112* 111 114* 110 106  CO2 23 22 23 22 24   GLUCOSE 106* 105* 119* 115* 95  BUN 21 16 13 13  7*  CREATININE 0.72 0.67 0.62 0.62 0.66  CALCIUM 7.8* 7.1* 7.2* 7.0* 7.2*  MG 2.0 1.7 1.7 1.7 1.6*  PHOS 2.4*  --  2.1* 1.9* 2.2*   GFR: Estimated Creatinine Clearance: 41 mL/min (by C-G formula based on SCr of 0.66 mg/dL). Liver Function  Tests: Recent Labs  Lab 08/21/23 0300 08/23/23 0410 08/24/23 0428 08/25/23 0325 08/26/23 0434  AST 101* 53* 43* 54* 44*  ALT 50* 36 21 27 29   ALKPHOS 61 44 61 90 78  BILITOT 0.9 0.8 1.0 0.7 0.9  PROT 6.6 4.9* 5.2* 5.3* 5.5*  ALBUMIN 2.6* 1.9* 2.0* 1.8* 1.9*   Coagulation Profile: Recent Labs  Lab 08/20/23 2026  INR 1.1   Cardiac Enzymes: Recent Labs  Lab 08/20/23 2026 08/21/23 0300  CKTOTAL 2,808* 2,414*   BNP (last 3 results) Recent Labs    08/21/23 0125  BNP 103.9*   CBG: Recent Labs  Lab 08/22/23 0304  GLUCAP 85   Recent Results (from the past 240 hours)  Resp panel by RT-PCR (RSV, Flu A&B, Covid) Anterior Nasal Swab     Status: Abnormal   Collection Time: 08/20/23  1:50 PM   Specimen: Anterior Nasal Swab  Result Value Ref Range Status   SARS Coronavirus 2 by RT PCR NEGATIVE NEGATIVE Final   Influenza A by PCR POSITIVE (A) NEGATIVE Final   Influenza B by PCR NEGATIVE NEGATIVE Final    Comment: (NOTE) The Xpert Xpress SARS-CoV-2/FLU/RSV plus assay is intended as an aid in the diagnosis of influenza from Nasopharyngeal swab specimens and should not be used as a sole basis for treatment. Nasal washings and aspirates are unacceptable for Xpert Xpress SARS-CoV-2/FLU/RSV testing.  Fact Sheet for Patients: BloggerCourse.com  Fact Sheet for Healthcare Providers: SeriousBroker.it  This test is not yet approved or cleared by the [place] FDA and has been authorized for detection and/or diagnosis of SARS-CoV-2 by FDA under an Emergency Use Authorization (EUA). This EUA will remain in effect (meaning this test can be used) for the duration of the  COVID-19 declaration under Section 564(b)(1) of the Act, 21 U.S.C. section 360bbb-3(b)(1), unless the authorization is terminated or revoked.     Resp Syncytial Virus by PCR NEGATIVE NEGATIVE Final    Comment: (NOTE) Fact Sheet for  Patients: BloggerCourse.com  Fact Sheet for Healthcare Providers: SeriousBroker.it  This test is not yet approved or cleared by the [place] FDA and has been authorized for detection and/or diagnosis of SARS-CoV-2 by FDA under an Emergency Use Authorization (EUA). This EUA will remain in effect (meaning this test can be used) for the duration of the COVID-19 declaration under Section 564(b)(1) of the Act, 21 U.S.C. section 360bbb-3(b)(1), unless the authorization is terminated or revoked.  Performed at [facility], [address] [place], [place] [address]   Culture, blood (Routine X 2) w Reflex to ID Panel     Status: None   Collection Time: 08/20/23 11:00 PM   Specimen: BLOOD LEFT ARM  Result Value Ref Range Status   Specimen Description BLOOD LEFT ARM  Final   Special Requests   Final    BOTTLES DRAWN AEROBIC AND ANAEROBIC Blood Culture results may not be optimal due to an inadequate volume of blood received in culture bottles   Culture   Final    NO GROWTH 6 DAYS Performed at [facility], [address] [place], [place] [address]    Report Status 08/26/2023 FINAL  Final  Culture, blood (Routine X 2) w Reflex to ID Panel     Status: None   Collection Time: 08/20/23 11:00 PM   Specimen: BLOOD RIGHT ARM  Result Value Ref Range Status   Specimen Description BLOOD RIGHT ARM  Final   Special Requests   Final    BOTTLES DRAWN AEROBIC AND ANAEROBIC Blood Culture results may not be optimal due to an inadequate volume of blood received in culture bottles   Culture   Final    NO GROWTH 6 DAYS Performed at [facility], [address] [place], [place] [address]    Report Status 08/26/2023 FINAL  Final     Radiology Studies: No results found.  Scheduled Meds:  atorvastatin  40 mg Oral Daily   enoxaparin (LOVENOX) injection  30 mg Subcutaneous Q24H   feeding supplement  237 mL Oral BID BM    senna-docusate  2 tablet Oral BID   sodium chloride flush  3 mL Intravenous Q12H   Continuous Infusions:   LOS: 7 days   Time spent: 40 minutes  Carollee Herter, DO  [facility]  08/27/2023, 5:20 PM

## 2023-08-27 NOTE — Assessment & Plan Note (Signed)
Resulted in right femur fracture.

## 2023-08-27 NOTE — Subjective & Objective (Signed)
Pt seen and examined. Met with pt's husband at bedside. Pt going to [facility] today. Stable

## 2023-08-27 NOTE — Progress Notes (Signed)
Physical Therapy Treatment Patient Details Name: Analyn Matusek MRN: [id number] DOB: [birth year] Today's Date: 08/27/2023   History of Present Illness 78 yo female admitted with R femoral fx and flu. s/p R hip hemi 08/22/23. Hx of Alz dementia, falls CKD, hearing loss, unsteady gait    PT Comments  Xin was awake and alert on today. Husband present in room. She is out of her mittens. She continues to require Max-Total A for mobility. Some mild improvement with sitting EOB-still pushes into extension/becomes resistant but there were brief periods of only Min A for static sitting balance. She was able to briefly stand at bedside with 2 HHA. She stood on L LE mostly with only the toes of R LE touching the floor-tends to keep R LE in flexed hip and knee positioning. She tends to keep R LE flexed and adducted in bed as well.  Unfortunately, she had a bowel incontinence episode that required return to bed. Total A for hygiene. Family remains hopeful for a trial of post acute rehab.    If plan is discharge home, recommend the following: Two people to help with walking and/or transfers;Two people to help with bathing/dressing/bathroom;Assistance with feeding   Can travel by private vehicle        Equipment Recommendations  Hospital bed;Wheelchair (measurements PT)    Recommendations for Other Services       Precautions / Restrictions Precautions Precautions: Fall Precaution/Restrictions Comments: incontinent Restrictions Weight Bearing Restrictions Per Provider Order: No Other Position/Activity Restrictions: WBAT     Mobility  Bed Mobility Overal bed mobility: Needs Assistance Bed Mobility: Rolling, Supine to Sit, Sit to Supine Rolling: Mod assist, +2 for physical assistance, +2 for safety/equipment   Supine to sit: Total assist, +2 for physical assistance, +2 for safety/equipment Sit to supine: Total assist, +2 for physical assistance, +2 for safety/equipment   General bed mobility  comments: Pt does not follow commands. Assist for trunk and bil LEs. Utilized bedpad for repositioning at EOB. Varying level of static sitting balance assistance on today: Min A -Max A with pt mostly pushing into extenstion/resistant (brief periods of Min A).    Transfers Overall transfer level: Needs assistance Equipment used: 2 person hand held assist Transfers: Sit to/from Stand Sit to Stand: Max assist, +2 physical assistance, +2 safety/equipment           General transfer comment: Assist to power up and support pt in standing. Pt stood mostly on L LE with only toes of R foot touching floor-pt tends to keep R LE in flexed hip and knee positioning. Pt stood for ~10 seconds. Plan was for transfer to recliner but pt with bowel incontinence-deferred transfer and assisted pt back to bed for hygiene    Ambulation/Gait                   Stairs             Wheelchair Mobility     Tilt Bed    Modified Rankin (Stroke Patients Only)       Balance Overall balance assessment: History of Falls, Needs assistance   Sitting balance-Leahy Scale: Poor       Standing balance-Leahy Scale: Zero                              Communication Communication Factors Affecting Communication: Hearing impaired;Difficulty expressing self  Cognition Arousal: Alert Behavior During Therapy: Restless (Fidgety)   PT - Cognitive  impairments: History of cognitive impairments                       PT - Cognition Comments: pt does not follow commands Following commands: Impaired      Cueing Cueing Techniques: Verbal cues, Gestural cues, Visual cues, Tactile cues  Exercises      General Comments        Pertinent Vitals/Pain Pain Assessment Pain Assessment: Faces Faces Pain Scale: Hurts even more Pain Location: with movement Pain Descriptors / Indicators: Grimacing Pain Intervention(s): Limited activity within patient's tolerance, Monitored during  session, Repositioned    Home Living                          Prior Function            PT Goals (current goals can now be found in the care plan section) Progress towards PT goals: Progressing toward goals    Frequency    Min 1X/week      PT Plan      Co-evaluation              AM-PAC PT "6 Clicks" Mobility   Outcome Measure  Help needed turning from your back to your side while in a flat bed without using bedrails?: Total Help needed moving from lying on your back to sitting on the side of a flat bed without using bedrails?: Total Help needed moving to and from a bed to a chair (including a wheelchair)?: Total Help needed standing up from a chair using your arms (e.g., wheelchair or bedside chair)?: Total Help needed to walk in hospital room?: Total Help needed climbing 3-5 steps with a railing? : Total 6 Click Score: 6    End of Session Equipment Utilized During Treatment: Gait belt Activity Tolerance:  (limited by cognition) Patient left: in bed;with call bell/phone within reach;with bed alarm set;with family/visitor present Nurse Communication:  (nursing has been lifting pt to recliner) PT Visit Diagnosis: Other abnormalities of gait and mobility (R26.89);Pain;History of falling (Z91.81);Repeated falls (R29.6)     Time: 8119-1478 PT Time Calculation (min) (ACUTE ONLY): 20 min  Charges:    $Therapeutic Activity: 8-22 mins PT General Charges $$ ACUTE PT VISIT: 1 Visit                         Faye Ramsay, PT Acute Rehabilitation  Office: [phone]

## 2023-08-28 DIAGNOSIS — J101 Influenza due to other identified influenza virus with other respiratory manifestations: Secondary | ICD-10-CM | POA: Diagnosis not present

## 2023-08-28 DIAGNOSIS — N179 Acute kidney failure, unspecified: Secondary | ICD-10-CM | POA: Diagnosis not present

## 2023-08-28 DIAGNOSIS — R296 Repeated falls: Secondary | ICD-10-CM | POA: Diagnosis not present

## 2023-08-28 DIAGNOSIS — S72001A Fracture of unspecified part of neck of right femur, initial encounter for closed fracture: Secondary | ICD-10-CM | POA: Diagnosis not present

## 2023-08-28 MED ORDER — ACETAMINOPHEN 500 MG PO TABS: 1000.0000 mg | ORAL_TABLET | Freq: Four times a day (QID) | ORAL | Status: AC | PRN

## 2023-08-28 MED ORDER — SENNOSIDES-DOCUSATE SODIUM 8.6-50 MG PO TABS
2.0000 | ORAL_TABLET | Freq: Two times a day (BID) | ORAL | Status: AC
Start: 2023-08-28 — End: ?

## 2023-08-28 MED ORDER — ENSURE ENLIVE PO LIQD: 237.0000 mL | Freq: Two times a day (BID) | ORAL | Status: AC

## 2023-08-28 MED ORDER — LORAZEPAM 0.5 MG PO TABS: 0.5000 mg | ORAL_TABLET | Freq: Every evening | ORAL | 0 refills | Status: AC | PRN

## 2023-08-28 MED ORDER — POLYETHYLENE GLYCOL 3350 17 G PO PACK
17.0000 g | PACK | Freq: Every day | ORAL | Status: AC | PRN
Start: 2023-08-28 — End: ?

## 2023-08-28 MED ORDER — ATORVASTATIN CALCIUM 40 MG PO TABS: 40.0000 mg | ORAL_TABLET | Freq: Every day | ORAL | Status: AC

## 2023-08-28 NOTE — TOC Transition Note (Signed)
Transition of Care [facility]) - Discharge Note   Patient Details  Name: [name] MRN: [id number] Date of Birth: 1946/03/13  [facility]) CM/SW Contact:  Larrie Kass, LCSW Phone Number: 08/28/2023, 9:31 AM   Clinical Narrative:    Pt to d/c to [facility], pt room assignment 1202 RN to call report to [phone]. CSW spoke with pt's spouse who agrees with d/c plan. PTAR called, no further TOC needs, TOC sign off.    Final next level of care: Skilled Nursing Facility Barriers to Discharge: Barriers Resolved   Patient Goals and CMS Choice Patient states their goals for this hospitalization and ongoing recovery are:: snf to get stronger          Discharge Placement              Patient chooses bed at: [facility] Patient to be transferred to facility by: EMS Name of family member notified: Oldfield,James (Spouse)  [phone] (Mobile Patient and family notified of of transfer: 08/28/23  Discharge Plan and Services Additional resources added to the After Visit Summary for                                       Social Drivers of Health (SDOH) Interventions SDOH Screenings   Food Insecurity: Patient Unable To Answer (08/21/2023)  Housing: Patient Unable To Answer (08/21/2023)  Transportation Needs: Patient Unable To Answer (08/21/2023)  Utilities: Patient Unable To Answer (08/21/2023)  Social Connections: Patient Unable To Answer (08/21/2023)  Tobacco Use: Low Risk  (08/22/2023)     Readmission Risk Interventions     No data to display

## 2023-08-28 NOTE — Progress Notes (Signed)
Attempted to call report x2 with no one answering phone after receptionist. Will leave number on packet for them to call me. Nino Parsley

## 2023-08-28 NOTE — Progress Notes (Signed)
 PROGRESS NOTE    Caitlyn Buchanan  NWG:[id number] DOB: 15-Jan-1946 DOA: 08/20/2023 PCP: Noberto Retort, MD  Subjective: Pt seen and examined. Met with pt's husband at bedside. Pt going to [facility] today. Stable    Hospital Course: Akeira Lahm is a 78 y.o. female with medical history significant of dementia, CKD presented to the hospital after a fall that was a week back when she had gone to urgent care center and since then she has not been mobile.  She was also having cough for a week and had sick contact with her husband.  At home at some point hospice was engaged but then family wished to have second opinion in the ED.  In the ED, CT scan of the abdomen and pelvis was done which revealed right femoral neck fracture.  Family rescinded DO NOT RESUSCITATE order and Ortho was consulted.  Assessment and plan   Femoral fracture with history of recurrent falls. CT scan of the abdomen pelvis with acute/subacute right femoral neck fracture subacute occult with angulation.  Orthopedics has been consulted and plan for palliative hip hemiarthroplasty if with as per the family..  Continue pain medication regimen.  Sed rate elevated at 34.  Elevated CK levels.  Continue with hydration.  Monitor closely.  Elevated troponins.  Seen by cardiology.  Possible myocarditis from influenza A.  On heparin.  Check 2D echocardiogram.   Influenza A Mild leukocytosis and elevated lactate on presentation..  CT scan with  possible left upper lobe opacity and nodular fashion possible infectious bronchiolitis.  Outside the window for Tamiflu.  Empirically on Unasyn for possible pneumonia..  O check a speech and swallow.  Blood cultures negative in less than 12 hours.  Continue with hydration.  CKD (chronic kidney disease) Initial creatinine of 1.15 with BUN of 39.   received IV fluids.  Elevated LFTs Likely secondary to elevated CK levels.  Continue to monitor.   Dementia with behavioral disturbance   Behavioral disturbance with new for 1 week likely secondary to pain.  Cocci Contin and Tylenol with some Dilaudid.  Might need Haldol for agitation.  Delirium precautions.  Might benefit from ongoing discussion about goals of care.   HPI: Sanaya Gwilliam is a 78 y.o. female with medical history significant of dementia.  Till about a week ago patient was ambulatory as per husband son and daughter at the bedside.  History principally from the patient's family as well as from ER signout and record review.  Apparently patient had a fall about a week ago for which patient was seen at an urgent care facility.  She had an x-ray of her?  Hips done.  Apparently the x-rays were negative and patient was discharged home.  Unfortunately patient did not get out of bed since then.  Patient has been cared for in the bed by family since then.  At some point hospice was engaged and family understood that patient was going to pass away soon with comfort measures.  Daughter decided to insist that the patient get a second opinion at the ER and therefore presents here.   ER course is notable for patient being found to be markedly apprehensive, occasionally yelling - pretty much with any touch below the pelvis.  As per family this has been the case for at least a week.  Also patient had a CAT scan done of the abdomen pelvis.  Which revealed a right femoral neck fracture.  Family has RECINDED DO NOT RESUSCITATE order.  Ortho has  been engaged.  Medical evaluation is sought.   Unfortunately history from the patient cannot be obtained.  Per family she was talkative when she came to the ER but since getting the Versed and fentanyl she has not been talking.   Patinet has had a cough wet X 1 week. Sick contact with husband. No fver, no tachypnea.  Significant Events: Admitted 08/20/2023 for right femoral neck fracture  Significant Labs: Influenza A POSITIVE WBC 13, HgB 14.4, Plt 335 Na 143, K 3.4, CO2 of 25, BUN 39, Scr 1.15, glu  103 AST 109, ALT 53, TP 6.9, alb 2.8  Significant Imaging Studies: CT head/c-spine  No acute intracranial abnormalities. Chronic atrophy and small vessel ischemic changes similar to prior studies. 2. Prominent degenerative changes throughout the cervical spine. No acute displaced fractures are identified. CT chest/abd/pelvis Subacute fracture of the RIGHT femoral neck is substantially displaced and  demonstrates varus angulation. 2. Nodular and branching opacities in the left upper lobe favoring atypical infectious bronchiolitis given the clustered appearance, largest individual nodule 0.8 by 0.6 cm. 3. Airway thickening is present, suggesting bronchitis or reactive airways disease. Layering mucus in the right mainstem bronchus. 4. Degenerative  glenohumeral arthropathy bilaterally. 5. 2.6 cm calcified uterine fibroid. 6.  Aortic Atherosclerosis   Antibiotic Therapy: Anti-infectives (From admission, onward)    Start     Dose/Rate Route Frequency Ordered Stop   08/22/23 2100  ceFAZolin (ANCEF) IVPB 2g/100 mL premix        2 g 200 mL/hr over 30 Minutes Intravenous Every 8 hours 08/22/23 1613 08/24/23 0700   08/22/23 0800  Ampicillin-Sulbactam (UNASYN) 3 g in sodium chloride 0.9 % 100 mL IVPB        3 g 200 mL/hr over 30 Minutes Intravenous Every 8 hours 08/22/23 0732 08/27/23 1445   08/22/23 0600  ceFAZolin (ANCEF) IVPB 2g/100 mL premix        2 g 200 mL/hr over 30 Minutes Intravenous On call to O.R. 08/21/23 1841 08/22/23 1316   08/20/23 2300  oseltamivir (TAMIFLU) capsule 30 mg        30 mg Oral 2 times daily 08/20/23 2250 08/25/23 2159   08/20/23 2200  Ampicillin-Sulbactam (UNASYN) 3 g in sodium chloride 0.9 % 100 mL IVPB  Status:  Discontinued        3 g 200 mL/hr over 30 Minutes Intravenous Every 12 hours 08/20/23 2159 08/22/23 0732       Procedures: 08-22-2023 right hip hemiarthroplasty  Consultants: Ortho Cardiology Palliative Care    Assessment and Plan: * Right femoral  fracture [facility]) - s/p right hip hemiarthroplasty on 08-22-2023 On admission through 08-26-2023.  Please see HPI, patient found to have acute/subacute right femoral neck fracture subacute occult with angulation.  Closed.  Ortho has been engaged.  For pain medication I will order standing acetaminophen as well as OxyContin.  Bowel regimen has been ordered.  I will also order as needed Dilaudid for comfort.  Given patient's marked apprehension, concern for missing further occult fractures.  Therefore I will order bilateral tib-fib x-rays.  Patient was able to be reevaluated afterwards and it does not look like the foot has any fracture..  Look forward to orthopedic evaluation in this regard. DVT prophylaxis with Lovenox ordered every evening.  08-27-2023 family is planning on SNF at discharge. Discussed with husband.Post op dvt ppx per ortho, plan for lovenox x4 weeks. WBAT. Follow with ortho 2 weeks after surgery 08-28-2023 DC to [facility] today.   Fall in  elderly patient Resulted in right femur fracture.  Influenza A On admission through 08-27-2023.  Husband reports patient has been coughing with for about a week.  Patient is noted to have leukocytosis as well as elevated lactic acidosis here.  On the CAT scan.  I reviewed the images myself.  Does not look like the patient has a pneumonia, although she does have some left upper lobe branching opacity and nodular fashion.  Which may be infectious bronchiolitis.  Will treat with Unasyn at this time, patient is outside the window to start treatment with oseltamivir.  Monitor clinically.  Will check swallow evaluation  08-27-2023 stable. On RA.  AKI (acute kidney injury) (HCC) On admission, Baseline GFR seems to be just below 60 based on labs in 3 months ago.  Patient current creatinine is 1.15 with BUN of 39.  I believe patient may be dehydrated from her right femoral neck fracture as well as some ongoing pain.  Patient received 1 L of saline in the ER.  I  will order another 1 L to be infused over the next 10 hours.  We will trend the creatinine.  Potassium will be really repleted orally.  08-27-2023 stable.   Elevated LFTs On admission through 08-27-2023.  Mild new. Given history, check CK first. Ok to give acetaminophen. Check inr ptt. Trend.  08-27-2023 AST 44 , ALT 29. Stable.  Dementia with behavioral disturbance (HCC) On admission through 08-26-2023.  Behavioral disturbance seems to be new in the last 1 week and I would attribute largely to pain.  Therefore standing OxyContin and acetaminophen has been ordered.  Also some Dilaudid.  However it is possible that the patient's behavioral disturbance may interfere with medical therapies necessary for the patient.  Therefore I will order as needed haloperidol when patient is not able to be redirected by family.  Seroquel for insomnia X 48 hours. At baseline walks without assistive device, but difficult to understand, rarely will say a simple phrase More confused than baseline, suspect delirium in setting of pain, hospitalization, etc Still with some post op delirium -> PO intake has improved Will see how she does with therapy - have tried to provide realistic expectations for her potential improvement  Delirium precautions   08-27-2023 stable. Delirium seems to be clearing. Pt still has advanced dementia.       DVT prophylaxis: enoxaparin (LOVENOX) injection 30 mg Start: 08/23/23 0900 SCDs Start: 08/20/23 2026    Code Status: Full Code Family Communication: discussed with husband at bedside Disposition Plan: SNF Reason for continuing need for hospitalization: stable for DC to SNF  Objective: Vitals:   08/27/23 1100 08/27/23 1329 08/27/23 2308 08/28/23 0524  BP:  (!) 163/66 (!) 165/110 (!) 163/65  Pulse:  68 69 70  Resp:   18   Temp:  98.4 F (36.9 C) 98.2 F (36.8 C) 98 F (36.7 C)  TempSrc:  Oral Oral Oral  SpO2: 95% 94% 91% 96%  Weight:      Height:        Intake/Output  Summary (Last 24 hours) at 08/28/2023 0933 Last data filed at 08/28/2023 0600 Gross per 24 hour  Intake 360 ml  Output 950 ml  Net -590 ml   Filed Weights   08/20/23 1358 08/22/23 1249  Weight: 47.6 kg 47.6 kg    Examination:  Physical Exam Vitals and nursing note reviewed.  Constitutional:      General: She is not in acute distress.    Appearance: She is  not toxic-appearing.     Comments: Chronically ill, frail elderly female. No distress  HENT:     Head: Normocephalic and atraumatic.     Nose: Nose normal.  Eyes:     General: No scleral icterus. Cardiovascular:     Rate and Rhythm: Normal rate and regular rhythm.  Pulmonary:     Effort: Pulmonary effort is normal.     Breath sounds: Normal breath sounds.  Abdominal:     General: Bowel sounds are normal. There is no distension.     Palpations: Abdomen is soft.  Skin:    General: Skin is warm and dry.     Capillary Refill: Capillary refill takes less than 2 seconds.  Neurological:     Mental Status: She is disoriented.     Data Reviewed: I have personally reviewed following labs and imaging studies  CBC: Recent Labs  Lab 08/22/23 1041 08/23/23 0410 08/24/23 0428 08/25/23 0325 08/26/23 0434  WBC 10.6* 9.5 15.9* 14.6* 12.4*  NEUTROABS  --  6.6 12.9* 10.9* 8.6*  HGB 11.5* 10.2* 10.6* 10.3* 10.2*  HCT 36.8 32.0* 33.6* 32.9* 31.8*  MCV 94.4 91.7 91.1 91.9 92.2  PLT 301 263 335 361 418*   Basic Metabolic Panel: Recent Labs  Lab 08/22/23 1041 08/23/23 0410 08/24/23 0428 08/25/23 0325 08/26/23 0434  NA 144 139 143 138 135  K 3.8 3.0* 3.7 3.4* 3.8  CL 112* 111 114* 110 106  CO2 23 22 23 22 24   GLUCOSE 106* 105* 119* 115* 95  BUN 21 16 13 13  7*  CREATININE 0.72 0.67 0.62 0.62 0.66  CALCIUM 7.8* 7.1* 7.2* 7.0* 7.2*  MG 2.0 1.7 1.7 1.7 1.6*  PHOS 2.4*  --  2.1* 1.9* 2.2*   GFR: Estimated Creatinine Clearance: 41 mL/min (by C-G formula based on SCr of 0.66 mg/dL). Liver Function Tests: Recent Labs   Lab 08/23/23 0410 08/24/23 0428 08/25/23 0325 08/26/23 0434  AST 53* 43* 54* 44*  ALT 36 21 27 29   ALKPHOS 44 61 90 78  BILITOT 0.8 1.0 0.7 0.9  PROT 4.9* 5.2* 5.3* 5.5*  ALBUMIN 1.9* 2.0* 1.8* 1.9*   BNP (last 3 results) Recent Labs    08/21/23 0125  BNP 103.9*   CBG: Recent Labs  Lab 08/22/23 0304  GLUCAP 85    Recent Results (from the past 240 hours)  Resp panel by RT-PCR (RSV, Flu A&B, Covid) Anterior Nasal Swab     Status: Abnormal   Collection Time: 08/20/23  1:50 PM   Specimen: Anterior Nasal Swab  Result Value Ref Range Status   SARS Coronavirus 2 by RT PCR NEGATIVE NEGATIVE Final   Influenza A by PCR POSITIVE (A) NEGATIVE Final   Influenza B by PCR NEGATIVE NEGATIVE Final    Comment: (NOTE) The Xpert Xpress SARS-CoV-2/FLU/RSV plus assay is intended as an aid in the diagnosis of influenza from Nasopharyngeal swab specimens and should not be used as a sole basis for treatment. Nasal washings and aspirates are unacceptable for Xpert Xpress SARS-CoV-2/FLU/RSV testing.  Fact Sheet for Patients: BloggerCourse.com  Fact Sheet for Healthcare Providers: SeriousBroker.it  This test is not yet approved or cleared by the [place] FDA and has been authorized for detection and/or diagnosis of SARS-CoV-2 by FDA under an Emergency Use Authorization (EUA). This EUA will remain in effect (meaning this test can be used) for the duration of the COVID-19 declaration under Section 564(b)(1) of the Act, 21 U.S.C. section 360bbb-3(b)(1), unless the authorization is terminated or  revoked.     Resp Syncytial Virus by PCR NEGATIVE NEGATIVE Final    Comment: (NOTE) Fact Sheet for Patients: BloggerCourse.com  Fact Sheet for Healthcare Providers: SeriousBroker.it  This test is not yet approved or cleared by the [place] FDA and has been authorized for detection  and/or diagnosis of SARS-CoV-2 by FDA under an Emergency Use Authorization (EUA). This EUA will remain in effect (meaning this test can be used) for the duration of the COVID-19 declaration under Section 564(b)(1) of the Act, 21 U.S.C. section 360bbb-3(b)(1), unless the authorization is terminated or revoked.  Performed at [facility], [address] [place], [place] [address]   Culture, blood (Routine X 2) w Reflex to ID Panel     Status: None   Collection Time: 08/20/23 11:00 PM   Specimen: BLOOD LEFT ARM  Result Value Ref Range Status   Specimen Description BLOOD LEFT ARM  Final   Special Requests   Final    BOTTLES DRAWN AEROBIC AND ANAEROBIC Blood Culture results may not be optimal due to an inadequate volume of blood received in culture bottles   Culture   Final    NO GROWTH 6 DAYS Performed at [facility], [address], [place], [place] [address]    Report Status 08/26/2023 FINAL  Final  Culture, blood (Routine X 2) w Reflex to ID Panel     Status: None   Collection Time: 08/20/23 11:00 PM   Specimen: BLOOD RIGHT ARM  Result Value Ref Range Status   Specimen Description BLOOD RIGHT ARM  Final   Special Requests   Final    BOTTLES DRAWN AEROBIC AND ANAEROBIC Blood Culture results may not be optimal due to an inadequate volume of blood received in culture bottles   Culture   Final    NO GROWTH 6 DAYS Performed at [facility], [address] [place], [place] [address]    Report Status 08/26/2023 FINAL  Final    Scheduled Meds:  atorvastatin  40 mg Oral Daily   enoxaparin (LOVENOX) injection  30 mg Subcutaneous Q24H   feeding supplement  237 mL Oral BID BM   senna-docusate  2 tablet Oral BID   sodium chloride flush  3 mL Intravenous Q12H   Continuous Infusions:   LOS: 8 days   Time spent: 40 minutes  Carollee Herter, DO  [facility]  08/28/2023, 9:33 AM

## 2023-08-28 NOTE — Discharge Summary (Signed)
[facility] Phys[facility] Discharge Summary   Patient name: [name]  Admit date:     08/20/2023  Discharge date: 08/28/2023  Attending Physician: Nolberto Hanlon [[id number]]  Discharge Physician: Carollee Herter   PCP: Diana Retort, MD  Admitted From: Home  Disposition:   [facility]  Recommendations for Outpatient Follow-up:  Follow up with PCP in 4 weeks Follow up with Orthopedics as scheduled. [facility] Please follow up on the following pending results:  Home Health:No Equipment/Devices: None   Discharge Condition:Stable CODE STATUS:FULL Diet recommendation: Heart Healthy Fluid Restriction: None  Hospital Summary: [name] is a 78 y.o. female with medical history significant of dementia, CKD presented to the hospital after a fall that was a week back when she had gone to urgent care center and since then she has not been mobile.  She was also having cough for a week and had sick contact with her husband.  At home at some point hospice was engaged but then family wished to have second opinion in the ED.  In the ED, CT scan of the abdomen and pelvis was done which revealed right femoral neck fracture.  Family rescinded DO NOT RESUSCITATE order and Ortho was consulted.  Assessment and plan   Femoral fracture with history of recurrent falls. CT scan of the abdomen pelvis with acute/subacute right femoral neck fracture subacute occult with angulation.  Orthopedics has been consulted and plan for palliative hip hemiarthroplasty if with as per the family..  Continue pain medication regimen.  Sed rate elevated at 34.  Elevated CK levels.  Continue with hydration.  Monitor closely.  Elevated troponins.  Seen by cardiology.  Possible myocarditis from influenza A.  On heparin.  Check 2D echocardiogram.   Influenza A Mild leukocytosis and elevated lactate on presentation..  CT scan with  possible left upper lobe opacity and nodular fashion  possible infectious bronchiolitis.  Outside the window for Tamiflu.  Empirically on Unasyn for possible pneumonia..  O check a speech and swallow.  Blood cultures negative in less than 12 hours.  Continue with hydration.  CKD (chronic kidney disease) Initial creatinine of 1.15 with BUN of 39.   received IV fluids.  Elevated LFTs Likely secondary to elevated CK levels.  Continue to monitor.   Dementia with behavioral disturbance  Behavioral disturbance with new for 1 week likely secondary to pain.  Cocci Contin and Tylenol with some Dilaudid.  Might need Haldol for agitation.  Delirium precautions.  Might benefit from ongoing discussion about goals of care.   HPI: [name] is a 78 y.o. female with medical history significant of dementia.  Till about a week ago patient was ambulatory as per husband son and daughter at the bedside.  History principally from the patient's family as well as from ER signout and record review.  Apparently patient had a fall about a week ago for which patient was seen at an urgent care facility.  She had an x-ray of her?  Hips done.  Apparently the x-rays were negative and patient was discharged home.  Unfortunately patient did not get out of bed since then.  Patient has been cared for in the bed by family since then.  At some point hospice was engaged and family understood that patient was going to pass away soon with comfort measures.  Daughter decided to insist that the patient get a second opinion at the ER and therefore presents here.   ER course is notable for patient  being found to be markedly apprehensive, occasionally yelling - pretty much with any touch below the pelvis.  As per family this has been the case for at least a week.  Also patient had a CAT scan done of the abdomen pelvis.  Which revealed a right femoral neck fracture.  Family has RECINDED DO NOT RESUSCITATE order.  Ortho has been engaged.  Medical evaluation is sought.   Unfortunately history  from the patient cannot be obtained.  Per family she was talkative when she came to the ER but since getting the Versed and fentanyl she has not been talking.   Patinet has had a cough wet X 1 week. Sick contact with husband. No fver, no tachypnea.  Significant Events: Admitted 08/20/2023 for right femoral neck fracture  Significant Labs: Influenza A POSITIVE WBC 13, HgB 14.4, Plt 335 Na 143, K 3.4, CO2 of 25, BUN 39, Scr 1.15, glu 103 AST 109, ALT 53, TP 6.9, alb 2.8  Significant Imaging Studies: CT head/c-spine  No acute intracranial abnormalities. Chronic atrophy and small vessel ischemic changes similar to prior studies. 2. Prominent degenerative changes throughout the cervical spine. No acute displaced fractures are identified. CT chest/abd/pelvis Subacute fracture of the RIGHT femoral neck is substantially displaced and  demonstrates varus angulation. 2. Nodular and branching opacities in the left upper lobe favoring atypical infectious bronchiolitis given the clustered appearance, largest individual nodule 0.8 by 0.6 cm. 3. Airway thickening is present, suggesting bronchitis or reactive airways disease. Layering mucus in the right mainstem bronchus. 4. Degenerative  glenohumeral arthropathy bilaterally. 5. 2.6 cm calcified uterine fibroid. 6.  Aortic Atherosclerosis   Antibiotic Therapy: Anti-infectives (From admission, onward)    Start     Dose/Rate Route Frequency Ordered Stop   08/22/23 2100  ceFAZolin (ANCEF) IVPB 2g/100 mL premix        2 g 200 mL/hr over 30 Minutes Intravenous Every 8 hours 08/22/23 1613 08/24/23 0700   08/22/23 0800  Ampicillin-Sulbactam (UNASYN) 3 g in sodium chloride 0.9 % 100 mL IVPB        3 g 200 mL/hr over 30 Minutes Intravenous Every 8 hours 08/22/23 0732 08/27/23 1445   08/22/23 0600  ceFAZolin (ANCEF) IVPB 2g/100 mL premix        2 g 200 mL/hr over 30 Minutes Intravenous On call to O.R. 08/21/23 1841 08/22/23 1316   08/20/23 2300  oseltamivir  (TAMIFLU) capsule 30 mg        30 mg Oral 2 times daily 08/20/23 2250 08/25/23 2159   08/20/23 2200  Ampicillin-Sulbactam (UNASYN) 3 g in sodium chloride 0.9 % 100 mL IVPB  Status:  Discontinued        3 g 200 mL/hr over 30 Minutes Intravenous Every 12 hours 08/20/23 2159 08/22/23 0732       Procedures: 08-22-2023 right hip hemiarthroplasty  Consultants: Ortho Cardiology [facility] Course by Problem: * Right femoral fracture [facility]) - s/p right hip hemiarthroplasty on 08-22-2023 On admission through 08-26-2023.  Please see HPI, patient found to have acute/subacute right femoral neck fracture subacute occult with angulation.  Closed.  Ortho has been engaged.  For pain medication I will order standing acetaminophen as well as OxyContin.  Bowel regimen has been ordered.  I will also order as needed Dilaudid for comfort.  Given patient's marked apprehension, concern for missing further occult fractures.  Therefore I will order bilateral tib-fib x-rays.  Patient was able to be reevaluated afterwards and it does not look  like the foot has any fracture..  Look forward to orthopedic evaluation in this regard. DVT prophylaxis with Lovenox ordered every evening.  08-27-2023 family is planning on SNF at discharge. Discussed with husband.Post op dvt ppx per ortho, plan for lovenox x4 weeks. WBAT. Follow with ortho 2 weeks after surgery 08-28-2023 DC to [facility] today.   Fall in elderly patient Resulted in right femur fracture.  Influenza A On admission through 08-27-2023.  Husband reports patient has been coughing with for about a week.  Patient is noted to have leukocytosis as well as elevated lactic acidosis here.  On the CAT scan.  I reviewed the images myself.  Does not look like the patient has a pneumonia, although she does have some left upper lobe branching opacity and nodular fashion.  Which may be infectious bronchiolitis.  Will treat with Unasyn at this time, patient is  outside the window to start treatment with oseltamivir.  Monitor clinically.  Will check swallow evaluation  08-27-2023 stable. On RA.  AKI (acute kidney injury) (HCC) On admission, Baseline GFR seems to be just below 60 based on labs in 3 months ago.  Patient current creatinine is 1.15 with BUN of 39.  I believe patient may be dehydrated from her right femoral neck fracture as well as some ongoing pain.  Patient received 1 L of saline in the ER.  I will order another 1 L to be infused over the next 10 hours.  We will trend the creatinine.  Potassium will be really repleted orally.  08-27-2023 stable.   Elevated LFTs On admission through 08-27-2023.  Mild new. Given history, check CK first. Ok to give acetaminophen. Check inr ptt. Trend.  08-27-2023 AST 44 , ALT 29. Stable.  Dementia with behavioral disturbance (HCC) On admission through 08-26-2023.  Behavioral disturbance seems to be new in the last 1 week and I would attribute largely to pain.  Therefore standing OxyContin and acetaminophen has been ordered.  Also some Dilaudid.  However it is possible that the patient's behavioral disturbance may interfere with medical therapies necessary for the patient.  Therefore I will order as needed haloperidol when patient is not able to be redirected by family.  Seroquel for insomnia X 48 hours. At baseline walks without assistive device, but difficult to understand, rarely will say a simple phrase More confused than baseline, suspect delirium in setting of pain, hospitalization, etc Still with some post op delirium -> PO intake has improved Will see how she does with therapy - have tried to provide realistic expectations for her potential improvement  Delirium precautions   08-27-2023 stable. Delirium seems to be clearing. Pt still has advanced dementia.    Discharge Diagnoses:  Principal Problem:   Right femoral fracture (HCC) - s/p right hip hemiarthroplasty on 08-22-2023 Active Problems:    Fall in elderly patient   AKI (acute kidney injury) (HCC)   Influenza A   Dementia with behavioral disturbance (HCC)   Elevated LFTs   Closed fracture of right hip [facility])   Discharge Instructions  Discharge Instructions     Call MD for:  difficulty breathing, headache or visual disturbances   Complete by: As directed    Call MD for:  extreme fatigue   Complete by: As directed    Call MD for:  hives   Complete by: As directed    Call MD for:  persistant dizziness or light-headedness   Complete by: As directed    Call MD for:  persistant  nausea and vomiting   Complete by: As directed    Call MD for:  redness, tenderness, or signs of infection (pain, swelling, redness, odor or green/yellow discharge around incision site)   Complete by: As directed    Call MD for:  severe uncontrolled pain   Complete by: As directed    Call MD for:  temperature >100.4   Complete by: As directed    Diet - low sodium heart healthy   Complete by: As directed    Discharge instructions   Complete by: As directed    1. Follow up with orthopedics as scheduled 2. Follow up with primary care provider in 4 weeks   Increase activity slowly   Complete by: As directed       Allergies as of 08/28/2023       Reactions   Aricept [donepezil] Diarrhea   Sulfa Antibiotics Hives   Wellbutrin [bupropion] Other (See Comments)   Body aches        Medication List     STOP taking these medications    CENTRUM SILVER 50+WOMEN PO       TAKE these medications    acetaminophen 500 MG tablet Commonly known as: TYLENOL Take 2 tablets (1,000 mg total) by mouth every 6 (six) hours as needed for mild pain (pain score 1-3), fever or headache (or Fever >/= 101).   atorvastatin 40 MG tablet Commonly known as: LIPITOR Take 1 tablet (40 mg total) by mouth daily. Start taking on: August 29, 2023   enoxaparin 40 MG/0.4ML injection Commonly known as: LOVENOX Inject 0.4 mLs (40 mg total) into the skin daily  for 28 days.   feeding supplement Liqd Take 237 mLs by mouth 2 (two) times daily between meals.   LORazepam 0.5 MG tablet Commonly known as: ATIVAN Take 1 tablet (0.5 mg total) by mouth at bedtime as needed for anxiety or sleep (Agitation).   oxyCODONE 5 MG immediate release tablet Commonly known as: Roxicodone Take 0.5-1 tablets (2.5-5 mg total) by mouth every 4 (four) hours as needed for up to 7 days for severe pain (pain score 7-10) or moderate pain (pain score 4-6).   polyethylene glycol 17 g packet Commonly known as: MIRALAX / GLYCOLAX Take 17 g by mouth daily as needed for mild constipation.   senna-docusate 8.6-50 MG tablet Commonly known as: Senokot-S Take 2 tablets by mouth 2 (two) times daily.        Contact information for follow-up providers     Joen Laura, MD Follow up in 2 week(s).   Specialty: Orthopedic Surgery Contact information: [address] [place] [address] [phone]              Contact information for after-discharge care     Destination     [facility] Preferred SNF .   Service: Skilled Nursing Contact information: [address] [place] [address] [phone]                    Allergies  Allergen Reactions   Aricept [Donepezil] Diarrhea   Sulfa Antibiotics Hives   Wellbutrin [Bupropion] Other (See Comments)    Body aches     Discharge Exam: Vitals:   08/27/23 2308 08/28/23 0524  BP: (!) 165/110 (!) 163/65  Pulse: 69 70  Resp: 18   Temp: 98.2 F (36.8 C) 98 F (36.7 C)  SpO2: 91% 96%    Physical Exam Vitals and nursing  note reviewed.  Constitutional:      General: She is not in acute distress.    Appearance: She is not toxic-appearing.     Comments: Chronically ill, frail elderly female. No distress  HENT:     Head: Normocephalic and atraumatic.     Nose: Nose normal.  Eyes:     General: No scleral  icterus. Cardiovascular:     Rate and Rhythm: Normal rate and regular rhythm.  Pulmonary:     Effort: Pulmonary effort is normal.     Breath sounds: Normal breath sounds.  Abdominal:     General: Bowel sounds are normal. There is no distension.     Palpations: Abdomen is soft.  Skin:    General: Skin is warm and dry.     Capillary Refill: Capillary refill takes less than 2 seconds.  Neurological:     Mental Status: She is disoriented.     The results of significant diagnostics from this hospitalization (including imaging, microbiology, ancillary and laboratory) are listed below for reference.    Microbiology: Recent Results (from the past 240 hours)  Resp panel by RT-PCR (RSV, Flu A&B, Covid) Anterior Nasal Swab     Status: Abnormal   Collection Time: 08/20/23  1:50 PM   Specimen: Anterior Nasal Swab  Result Value Ref Range Status   SARS Coronavirus 2 by RT PCR NEGATIVE NEGATIVE Final   Influenza A by PCR POSITIVE (A) NEGATIVE Final   Influenza B by PCR NEGATIVE NEGATIVE Final    Comment: (NOTE) The Xpert Xpress SARS-CoV-2/FLU/RSV plus assay is intended as an aid in the diagnosis of influenza from Nasopharyngeal swab specimens and should not be used as a sole basis for treatment. Nasal washings and aspirates are unacceptable for Xpert Xpress SARS-CoV-2/FLU/RSV testing.  Fact Sheet for Patients: BloggerCourse.com  Fact Sheet for Healthcare Providers: SeriousBroker.it  This test is not yet approved or cleared by the [place] FDA and has been authorized for detection and/or diagnosis of SARS-CoV-2 by FDA under an Emergency Use Authorization (EUA). This EUA will remain in effect (meaning this test can be used) for the duration of the COVID-19 declaration under Section 564(b)(1) of the Act, 21 U.S.C. section 360bbb-3(b)(1), unless the authorization is terminated or revoked.     Resp Syncytial Virus by PCR NEGATIVE  NEGATIVE Final    Comment: (NOTE) Fact Sheet for Patients: BloggerCourse.com  Fact Sheet for Healthcare Providers: SeriousBroker.it  This test is not yet approved or cleared by the [place] FDA and has been authorized for detection and/or diagnosis of SARS-CoV-2 by FDA under an Emergency Use Authorization (EUA). This EUA will remain in effect (meaning this test can be used) for the duration of the COVID-19 declaration under Section 564(b)(1) of the Act, 21 U.S.C. section 360bbb-3(b)(1), unless the authorization is terminated or revoked.  Performed at [facility], [address], [place], [place] [address]   Culture, blood (Routine X 2) w Reflex to ID Panel     Status: None   Collection Time: 08/20/23 11:00 PM   Specimen: BLOOD LEFT ARM  Result Value Ref Range Status   Specimen Description BLOOD LEFT ARM  Final   Special Requests   Final    BOTTLES DRAWN AEROBIC AND ANAEROBIC Blood Culture results may not be optimal due to an inadequate volume of blood received in culture bottles   Culture   Final    NO GROWTH 6 DAYS Performed at [facility], [address] [place],  [place] [address]    Report Status 08/26/2023 FINAL  Final  Culture, blood (Routine X 2) w Reflex to ID Panel     Status: None   Collection Time: 08/20/23 11:00 PM   Specimen: BLOOD RIGHT ARM  Result Value Ref Range Status   Specimen Description BLOOD RIGHT ARM  Final   Special Requests   Final    BOTTLES DRAWN AEROBIC AND ANAEROBIC Blood Culture results may not be optimal due to an inadequate volume of blood received in culture bottles   Culture   Final    NO GROWTH 6 DAYS Performed at [facility], [address], [place], [place] [address]    Report Status 08/26/2023 FINAL  Final     Labs: BNP (last 3 results) Recent Labs    08/21/23 0125  BNP 103.9*   Basic Metabolic Panel: Recent Labs  Lab 08/22/23 1041 08/23/23 0410  08/24/23 0428 08/25/23 0325 08/26/23 0434  NA 144 139 143 138 135  K 3.8 3.0* 3.7 3.4* 3.8  CL 112* 111 114* 110 106  CO2 23 22 23 22 24   GLUCOSE 106* 105* 119* 115* 95  BUN 21 16 13 13  7*  CREATININE 0.72 0.67 0.62 0.62 0.66  CALCIUM 7.8* 7.1* 7.2* 7.0* 7.2*  MG 2.0 1.7 1.7 1.7 1.6*  PHOS 2.4*  --  2.1* 1.9* 2.2*   Liver Function Tests: Recent Labs  Lab 08/23/23 0410 08/24/23 0428 08/25/23 0325 08/26/23 0434  AST 53* 43* 54* 44*  ALT 36 21 27 29   ALKPHOS 44 61 90 78  BILITOT 0.8 1.0 0.7 0.9  PROT 4.9* 5.2* 5.3* 5.5*  ALBUMIN 1.9* 2.0* 1.8* 1.9*   CBC: Recent Labs  Lab 08/22/23 1041 08/23/23 0410 08/24/23 0428 08/25/23 0325 08/26/23 0434  WBC 10.6* 9.5 15.9* 14.6* 12.4*  NEUTROABS  --  6.6 12.9* 10.9* 8.6*  HGB 11.5* 10.2* 10.6* 10.3* 10.2*  HCT 36.8 32.0* 33.6* 32.9* 31.8*  MCV 94.4 91.7 91.1 91.9 92.2  PLT 301 263 335 361 418*   CBG: Recent Labs  Lab 08/22/23 0304  GLUCAP 85   Urinalysis    Component Value Date/Time   COLORURINE YELLOW 08/20/2023 1817   APPEARANCEUR CLEAR 08/20/2023 1817   LABSPEC 1.021 08/20/2023 1817   PHURINE 5.0 08/20/2023 1817   GLUCOSEU NEGATIVE 08/20/2023 1817   HGBUR MODERATE (A) 08/20/2023 1817   BILIRUBINUR NEGATIVE 08/20/2023 1817   KETONESUR NEGATIVE 08/20/2023 1817   PROTEINUR 30 (A) 08/20/2023 1817   UROBILINOGEN 0.2 12/28/2013 1134   NITRITE NEGATIVE 08/20/2023 1817   LEUKOCYTESUR NEGATIVE 08/20/2023 1817   Sepsis Labs Recent Labs  Lab 08/23/23 0410 08/24/23 0428 08/25/23 0325 08/26/23 0434  WBC 9.5 15.9* 14.6* 12.4*   Microbiology Recent Results (from the past 240 hours)  Resp panel by RT-PCR (RSV, Flu A&B, Covid) Anterior Nasal Swab     Status: Abnormal   Collection Time: 08/20/23  1:50 PM   Specimen: Anterior Nasal Swab  Result Value Ref Range Status   SARS Coronavirus 2 by RT PCR NEGATIVE NEGATIVE Final   Influenza A by PCR POSITIVE (A) NEGATIVE Final   Influenza B by PCR NEGATIVE NEGATIVE Final     Comment: (NOTE) The Xpert Xpress SARS-CoV-2/FLU/RSV plus assay is intended as an aid in the diagnosis of influenza from Nasopharyngeal swab specimens and should not be used as a sole basis for treatment. Nasal washings and aspirates are unacceptable for Xpert Xpress SARS-CoV-2/FLU/RSV testing.  Fact Sheet for Patients: BloggerCourse.com  Fact  Sheet for Healthcare Providers: SeriousBroker.it  This test is not yet approved or cleared by the [place] and has been authorized for detection and/or diagnosis of SARS-CoV-2 by FDA under an Emergency Use Authorization (EUA). This EUA will remain in effect (meaning this test can be used) for the duration of the COVID-19 declaration under Section 564(b)(1) of the Act, 21 U.S.C. section 360bbb-3(b)(1), unless the authorization is terminated or revoked.     Resp Syncytial Virus by PCR NEGATIVE NEGATIVE Final    Comment: (NOTE) Fact Sheet for Patients: BloggerCourse.com  Fact Sheet for Healthcare Providers: SeriousBroker.it  This test is not yet approved or cleared by the [place] FDA and has been authorized for detection and/or diagnosis of SARS-CoV-2 by FDA under an Emergency Use Authorization (EUA). This EUA will remain in effect (meaning this test can be used) for the duration of the COVID-19 declaration under Section 564(b)(1) of the Act, 21 U.S.C. section 360bbb-3(b)(1), unless the authorization is terminated or revoked.  Performed at [facility], [address] [place], [place] [address]   Culture, blood (Routine X 2) w Reflex to ID Panel     Status: None   Collection Time: 08/20/23 11:00 PM   Specimen: BLOOD LEFT ARM  Result Value Ref Range Status   Specimen Description BLOOD LEFT ARM  Final   Special Requests   Final    BOTTLES DRAWN AEROBIC AND ANAEROBIC Blood Culture results may not be optimal due to  an inadequate volume of blood received in culture bottles   Culture   Final    NO GROWTH 6 DAYS Performed at [facility], [address], [place], [place] [address]    Report Status 08/26/2023 FINAL  Final  Culture, blood (Routine X 2) w Reflex to ID Panel     Status: None   Collection Time: 08/20/23 11:00 PM   Specimen: BLOOD RIGHT ARM  Result Value Ref Range Status   Specimen Description BLOOD RIGHT ARM  Final   Special Requests   Final    BOTTLES DRAWN AEROBIC AND ANAEROBIC Blood Culture results may not be optimal due to an inadequate volume of blood received in culture bottles   Culture   Final    NO GROWTH 6 DAYS Performed at [facility], [address], [place], [place] [address]    Report Status 08/26/2023 FINAL  Final    Procedures/Studies: DG HIP UNILAT W OR W/O PELVIS 2-3 VIEWS RIGHT Result Date: 08/22/2023 CLINICAL DATA:  Postoperative state, right hip. EXAM: DG HIP (WITH OR WITHOUT PELVIS) 2-3V RIGHT COMPARISON:  CT chest, abdomen, and pelvis 08/20/2023 FINDINGS: There is diffuse decreased bone mineralization. Interval total right hip arthroplasty. No perihardware lucency is seen to indicate hardware failure or loosening. Expected postoperative intra-articular and lateral right hip subcutaneous air. Mild superomedial left femoroacetabular joint space narrowing. Mild superior pubic symphysis joint space narrowing. Vascular phleboliths overlie the left hemipelvis. Partial visualization of lower lumbar fusion hardware. IMPRESSION: Interval total right hip arthroplasty without evidence of hardware failure. Electronically Signed   By: Neita Garnet M.D.   On: 08/22/2023 17:45   ECHOCARDIOGRAM COMPLETE Result Date: 08/22/2023    ECHOCARDIOGRAM REPORT   Patient Name:   ALIVIA CIMINO Date of Exam: 08/22/2023 Medical Rec #:  [id number]     Height:       60.0 in Accession #:    [id number]    Weight:       105.0 lb Date of Birth:  [birth year]     BSA:          1.419 m Patient Age:     77 years      BP:           144/78 mmHg Patient Gender: F             HR:           76 bpm. Exam Location:  Inpatient Procedure: 2D Echo, Cardiac Doppler and Color Doppler Indications:   NSTEMI I21.4  History:       Patient has no prior history of Echocardiogram examinations.                NSTEMI.  Sonographer:   Webb Laws Referring      [id number] [facility] GOEL Phys:  Sonographer Comments: Image acquisition challenging due to uncooperative patient. IMPRESSIONS  1. Left ventricular ejection fraction, by estimation, is 60 to 65%. The left ventricle has normal function. The left ventricle has no regional wall motion abnormalities. Left ventricular diastolic parameters were normal.  2. Right ventricular systolic function is normal. The right ventricular size is normal.  3. The mitral valve is normal in structure. Mild mitral valve regurgitation. No evidence of mitral stenosis.  4. The aortic valve is normal in structure. Aortic valve regurgitation is mild. No aortic stenosis is present.  5. The inferior vena cava is normal in size with greater than 50% respiratory variability, suggesting right atrial pressure of 3 mmHg. FINDINGS  Left Ventricle: Left ventricular ejection fraction, by estimation, is 60 to 65%. The left ventricle has normal function. The left ventricle has no regional wall motion abnormalities. The left ventricular internal cavity size was normal in size. There is  no left ventricular hypertrophy. Left ventricular diastolic parameters were normal. Right Ventricle: The right ventricular size is normal. No increase in right ventricular wall thickness. Right ventricular systolic function is normal. Left Atrium: Left atrial size was normal in size. Right Atrium: Right atrial size was normal in size. Pericardium: Trivial pericardial effusion is present. Mitral Valve: The mitral valve is normal in structure. Mild mitral valve regurgitation. No evidence of mitral valve stenosis. Tricuspid Valve: The  tricuspid valve is normal in structure. Tricuspid valve regurgitation is not demonstrated. No evidence of tricuspid stenosis. Aortic Valve: The aortic valve is normal in structure. Aortic valve regurgitation is mild. No aortic stenosis is present. Pulmonic Valve: The pulmonic valve was normal in structure. Pulmonic valve regurgitation is not visualized. No evidence of pulmonic stenosis. Aorta: The aortic root is normal in size and structure. Venous: The inferior vena cava is normal in size with greater than 50% respiratory variability, suggesting right atrial pressure of 3 mmHg. IAS/Shunts: No atrial level shunt detected by color flow Doppler.  LEFT VENTRICLE PLAX 2D LVIDd:         3.80 cm   Diastology LVIDs:         2.20 cm   LV e' medial:    5.11 cm/s LV PW:         1.00 cm   LV E/e' medial:  18.9 LV IVS:        0.90 cm   LV e' lateral:   7.40 cm/s LVOT diam:     1.60 cm   LV E/e' lateral: 13.1 LV SV:         34 LV SV Index:   24 LVOT Area:     2.01 cm  RIGHT VENTRICLE  IVC RV S prime:     15.10 cm/s  IVC diam: 1.80 cm TAPSE (M-mode): 2.4 cm LEFT ATRIUM           Index       RIGHT ATRIUM          Index LA diam:      2.90 cm 2.04 cm/m  RA Area:     6.70 cm LA Vol (A2C): 7.1 ml  4.99 ml/m  RA Volume:   10.50 ml 7.40 ml/m LA Vol (A4C): 10.0 ml 7.03 ml/m  AORTIC VALVE LVOT Vmax:   93.27 cm/s LVOT Vmean:  62.600 cm/s LVOT VTI:    0.171 m  AORTA Ao Root diam: 2.10 cm MITRAL VALVE                TRICUSPID VALVE MV Area (PHT): 3.21 cm     TR Peak grad:   31.4 mmHg MV Decel Time: 236 msec     TR Vmax:        280.00 cm/s MV E velocity: 96.70 cm/s MV A velocity: 114.00 cm/s  SHUNTS MV E/A ratio:  0.85         Systemic VTI:  0.17 m                             Systemic Diam: 1.60 cm Clearnce Hasten Electronically signed by Clearnce Hasten Signature Date/Time: 08/22/2023/11:33:59 AM    Final    DG Chest Port 1 View Result Date: 08/22/2023 CLINICAL DATA:  Tachypnea. EXAM: PORTABLE CHEST 1 VIEW COMPARISON:   08/20/2023 FINDINGS: Lungs are hyperexpanded. The lungs are clear without focal pneumonia, edema, pneumothorax or pleural effusion. Interstitial markings are diffusely coarsened with chronic features. The cardiopericardial silhouette is within normal limits for size. Degenerative changes are noted in both shoulders. Telemetry leads overlie the chest. IMPRESSION: Hyperexpansion with chronic interstitial coarsening. No acute cardiopulmonary findings. Left suprahilar nodularity seen on previous chest x-ray not well demonstrated on the current study. Electronically Signed   By: Kennith Center M.D.   On: 08/22/2023 05:02   DG Tibia/Fibula Left Port Result Date: 08/20/2023 CLINICAL DATA:  Lower leg pain after a fall with broken right hip. EXAM: PORTABLE LEFT TIBIA AND FIBULA - 2 VIEW COMPARISON:  Left femur 08/20/2023 FINDINGS: Examination is technically limited with single lateral view obtained. Lateral view demonstrates a left total knee arthroplasty. As visualized, components appear well seated. The tibia and fibula appear intact. No acute displaced fractures are identified. IMPRESSION: Left total knee arthroplasty. No acute displaced fractures demonstrated on limited lateral view of the left lower leg. Electronically Signed   By: Burman Nieves M.D.   On: 08/20/2023 21:21   DG Tibia/Fibula Right Result Date: 08/20/2023 CLINICAL DATA:  Lower leg pain after a fall with broken right hip. EXAM: RIGHT TIBIA AND FIBULA - 2 VIEW COMPARISON:  Right femur 08/13/2023 FINDINGS: Limited lateral only view of the right tibia/fibula demonstrates a right total knee arthroplasty. As visualized, the components appear well seated. No significant effusion. Visualized tibia and fibula appear intact. No displaced fractures are identified on single view. IMPRESSION: Right total knee arthroplasty. No displaced fractures demonstrated in the right tibia/fibula on single view. Electronically Signed   By: Burman Nieves M.D.   On:  08/20/2023 21:20   CT HEAD WO CONTRAST Result Date: 08/20/2023 CLINICAL DATA:  Head trauma, moderate to severe. Poly trauma, blunt. Patient fell 9 days ago. EXAM: CT HEAD WITHOUT  CONTRAST CT CERVICAL SPINE WITHOUT CONTRAST TECHNIQUE: Multidetector CT imaging of the head and cervical spine was performed following the standard protocol without intravenous contrast. Multiplanar CT image reconstructions of the cervical spine were also generated. RADIATION DOSE REDUCTION: This exam was performed according to the departmental dose-optimization program which includes automated exposure control, adjustment of the mA and/or kV according to patient size and/or use of iterative reconstruction technique. COMPARISON:  MRI brain, CT head, and CT cervical spine 05/12/2023 FINDINGS: CT HEAD FINDINGS Brain: Diffuse cerebral atrophy. Ventricular dilatation consistent with central atrophy. Low-attenuation changes in the deep white matter consistent with small vessel ischemia. No abnormal extra-axial fluid collections. No mass effect or midline shift. Gray-white matter junctions are distinct. Basal cisterns are not effaced. No acute intracranial hemorrhage. Vascular: No hyperdense vessel or unexpected calcification. Skull: Normal. Negative for fracture or focal lesion. Sinuses/Orbits: No acute finding. Other: None. CT CERVICAL SPINE FINDINGS Alignment: Slight anterior subluxations at C3-4 and C4-5 levels and retrolisthesis at C5-6 level are unchanged since prior study, likely degenerative. Skull base and vertebrae: Skull base appears intact. No vertebral compression deformities. No focal bone lesion or bone destruction. Soft tissues and spinal canal: No prevertebral soft tissue swelling. No abnormal paraspinal soft tissue mass or infiltration. Ligamentous calcification at the craniocervical junction. Disc levels: Degenerative changes throughout the cervical spine with narrowed disc spaces and endplate osteophyte formation throughout.  Degenerative changes in the cervical facet joints. Upper chest: Lung apices are clear. Other: None. IMPRESSION: 1. No acute intracranial abnormalities. Chronic atrophy and small vessel ischemic changes similar to prior studies. 2. Prominent degenerative changes throughout the cervical spine. No acute displaced fractures are identified. Electronically Signed   By: Burman Nieves M.D.   On: 08/20/2023 18:21   CT CERVICAL SPINE WO CONTRAST Result Date: 08/20/2023 CLINICAL DATA:  Head trauma, moderate to severe. Poly trauma, blunt. Patient fell 9 days ago. EXAM: CT HEAD WITHOUT CONTRAST CT CERVICAL SPINE WITHOUT CONTRAST TECHNIQUE: Multidetector CT imaging of the head and cervical spine was performed following the standard protocol without intravenous contrast. Multiplanar CT image reconstructions of the cervical spine were also generated. RADIATION DOSE REDUCTION: This exam was performed according to the departmental dose-optimization program which includes automated exposure control, adjustment of the mA and/or kV according to patient size and/or use of iterative reconstruction technique. COMPARISON:  MRI brain, CT head, and CT cervical spine 05/12/2023 FINDINGS: CT HEAD FINDINGS Brain: Diffuse cerebral atrophy. Ventricular dilatation consistent with central atrophy. Low-attenuation changes in the deep white matter consistent with small vessel ischemia. No abnormal extra-axial fluid collections. No mass effect or midline shift. Gray-white matter junctions are distinct. Basal cisterns are not effaced. No acute intracranial hemorrhage. Vascular: No hyperdense vessel or unexpected calcification. Skull: Normal. Negative for fracture or focal lesion. Sinuses/Orbits: No acute finding. Other: None. CT CERVICAL SPINE FINDINGS Alignment: Slight anterior subluxations at C3-4 and C4-5 levels and retrolisthesis at C5-6 level are unchanged since prior study, likely degenerative. Skull base and vertebrae: Skull base appears  intact. No vertebral compression deformities. No focal bone lesion or bone destruction. Soft tissues and spinal canal: No prevertebral soft tissue swelling. No abnormal paraspinal soft tissue mass or infiltration. Ligamentous calcification at the craniocervical junction. Disc levels: Degenerative changes throughout the cervical spine with narrowed disc spaces and endplate osteophyte formation throughout. Degenerative changes in the cervical facet joints. Upper chest: Lung apices are clear. Other: None. IMPRESSION: 1. No acute intracranial abnormalities. Chronic atrophy and small vessel ischemic changes similar to prior studies.  2. Prominent degenerative changes throughout the cervical spine. No acute displaced fractures are identified. Electronically Signed   By: Burman Nieves M.D.   On: 08/20/2023 18:21   CT CHEST ABDOMEN PELVIS WO CONTRAST Result Date: 08/20/2023 CLINICAL DATA:  Weakness, trauma, cough and congestion, malodorous urine, fall 9 days ago. Advanced dementia. EXAM: CT CHEST, ABDOMEN AND PELVIS WITHOUT CONTRAST TECHNIQUE: Multidetector CT imaging of the chest, abdomen and pelvis was performed following the standard protocol without IV contrast. RADIATION DOSE REDUCTION: This exam was performed according to the departmental dose-optimization program which includes automated exposure control, adjustment of the mA and/or kV according to patient size and/or use of iterative reconstruction technique. COMPARISON:  Multiple exams, including abdominal CT 12/25/2009 and thoracic spine CT 05/12/2023 FINDINGS: CT CHEST FINDINGS Cardiovascular: Atheromatous vascular calcification of the aortic arch. Mediastinum/Nodes: Unremarkable Lungs/Pleura: Mild biapical pleuroparenchymal scarring. Airway thickening is present, suggesting bronchitis or reactive airways disease. Layering mucus in the right mainstem bronchus. Nodular and branching opacities in the left upper lobe favoring atypical infectious bronchiolitis  given the clustered appearance, largest individual nodule 0.8 by 0.6 cm on image 62 series 5. Musculoskeletal: Degenerative glenohumeral arthropathy bilaterally. CT ABDOMEN PELVIS FINDINGS Hepatobiliary: Unremarkable Pancreas: Unremarkable Spleen: Unremarkable Adrenals/Urinary Tract: Bosniak category 1 cyst of the right kidney lower pole laterally on image 69 series 3. Similar lesion in the left kidney upper pole. No further imaging workup of these lesions is indicated. Adrenal glands in urinary bladder unremarkable. Stomach/Bowel: Unremarkable Vascular/Lymphatic: Atherosclerosis is present, including aortoiliac atherosclerotic disease. Reproductive: 2.6 cm calcified uterine fibroid. Otherwise unremarkable. Other: No supplemental non-categorized findings. Musculoskeletal: Subacute fracture of the RIGHT femoral neck is substantially displaced and demonstrates varus angulation. The left hip is substantially flexed and adducted but I do not see a fracture of the left proximal femur/hip or of the left hemipelvis. Interbody fusion at L1-2 and at L3-L4-L5 with posterolateral rod and pedicle screw fixation at L4-5. No acute lumbar spine findings. IMPRESSION: 1. Subacute fracture of the RIGHT femoral neck is substantially displaced and demonstrates varus angulation. 2. Nodular and branching opacities in the left upper lobe favoring atypical infectious bronchiolitis given the clustered appearance, largest individual nodule 0.8 by 0.6 cm. 3. Airway thickening is present, suggesting bronchitis or reactive airways disease. Layering mucus in the right mainstem bronchus. 4. Degenerative glenohumeral arthropathy bilaterally. 5. 2.6 cm calcified uterine fibroid. 6.  Aortic Atherosclerosis (ICD10-I70.0). Electronically Signed   By: Gaylyn Rong M.D.   On: 08/20/2023 18:00   DG FEMUR PORT MIN 2 VIEWS LEFT Result Date: 08/20/2023 CLINICAL DATA:  Left hip pain EXAM: LEFT FEMUR PORTABLE 1 VIEW COMPARISON:  Pelvic radiograph from  [facility] dated 08/13/2023 FINDINGS: There is a right femoral neck fracture. Due to difficulties with patient cooperation and discomfort, a single nonstandard oblique projection is obtained on 2 images. I do not see a definite left femoral fracture on this single view. There is a left total knee prosthesis observed. The left hip is held with prominent adduction. IMPRESSION: 1. Right (contralateral) femoral neck fracture. 2. No definite left femoral fracture on this single view. 3. Left total knee prosthesis. 4. Prominent adduction of the left hip. Electronically Signed   By: Gaylyn Rong M.D.   On: 08/20/2023 17:50   DG Chest 1 View Result Date: 08/20/2023 CLINICAL DATA:  Pneumonia, left hip pain EXAM: CHEST  1 VIEW COMPARISON:  05/12/2023 FINDINGS: Subtle left suprahilar nodularity, for further characterization on the patient's CT scan. The lungs appear otherwise clear. Cardiac  and mediastinal margins appear normal. No blunting of the costophrenic angles. Degenerative glenohumeral arthropathy is present bilaterally. IMPRESSION: 1. Subtle left suprahilar nodularity, for further characterization on the patient's chest CT scan. 2. Degenerative glenohumeral arthropathy bilaterally. Electronically Signed   By: Gaylyn Rong M.D.   On: 08/20/2023 17:46    Time coordinating discharge: 40 mins  SIGNED:  Carollee Herter, DO [facility] 08/28/23, 9:38 AM

## 2023-10-07 DIAGNOSIS — S72041D Displaced fracture of base of neck of right femur, subsequent encounter for closed fracture with routine healing: Secondary | ICD-10-CM | POA: Diagnosis not present
